# Patient Record
Sex: Male | Born: 1950
Health system: Southern US, Community
[De-identification: ages and names within clinical notes are randomized; demographics above are authoritative.]

## PROBLEM LIST (undated history)

## (undated) DIAGNOSIS — E119 Type 2 diabetes mellitus without complications: Secondary | ICD-10-CM

## (undated) DIAGNOSIS — E538 Deficiency of other specified B group vitamins: Secondary | ICD-10-CM

## (undated) DIAGNOSIS — M545 Other chronic pain: Secondary | ICD-10-CM

## (undated) DIAGNOSIS — E785 Hyperlipidemia, unspecified: Secondary | ICD-10-CM

## (undated) DIAGNOSIS — G709 Myoneural disorder, unspecified: Secondary | ICD-10-CM

## (undated) DIAGNOSIS — J189 Pneumonia, unspecified organism: Secondary | ICD-10-CM

## (undated) DIAGNOSIS — K219 Gastro-esophageal reflux disease without esophagitis: Secondary | ICD-10-CM

## (undated) DIAGNOSIS — R011 Cardiac murmur, unspecified: Secondary | ICD-10-CM

## (undated) DIAGNOSIS — M19071 Primary osteoarthritis, right ankle and foot: Secondary | ICD-10-CM

## (undated) DIAGNOSIS — G8929 Other chronic pain: Secondary | ICD-10-CM

## (undated) DIAGNOSIS — I10 Essential (primary) hypertension: Secondary | ICD-10-CM

## (undated) DIAGNOSIS — I2510 Atherosclerotic heart disease of native coronary artery without angina pectoris: Secondary | ICD-10-CM

## (undated) DIAGNOSIS — F419 Anxiety disorder, unspecified: Secondary | ICD-10-CM

## (undated) DIAGNOSIS — F32A Depression, unspecified: Secondary | ICD-10-CM

## (undated) DIAGNOSIS — G459 Transient cerebral ischemic attack, unspecified: Secondary | ICD-10-CM

## (undated) DIAGNOSIS — M1990 Unspecified osteoarthritis, unspecified site: Secondary | ICD-10-CM

## (undated) DIAGNOSIS — R7301 Impaired fasting glucose: Secondary | ICD-10-CM

## (undated) DIAGNOSIS — I639 Cerebral infarction, unspecified: Secondary | ICD-10-CM

## (undated) DIAGNOSIS — I700 Atherosclerosis of aorta: Secondary | ICD-10-CM

## (undated) DIAGNOSIS — R55 Syncope and collapse: Secondary | ICD-10-CM

## (undated) DIAGNOSIS — Z87442 Personal history of urinary calculi: Secondary | ICD-10-CM

## (undated) DIAGNOSIS — M722 Plantar fascial fibromatosis: Secondary | ICD-10-CM

## (undated) HISTORY — DX: Anxiety disorder, unspecified: F41.9

## (undated) HISTORY — DX: Essential (primary) hypertension: I10

## (undated) HISTORY — DX: Other chronic pain: M54.50

## (undated) HISTORY — DX: Myoneural disorder, unspecified: G70.9

## (undated) HISTORY — DX: Other chronic pain: G89.29

## (undated) HISTORY — DX: Gastro-esophageal reflux disease without esophagitis: K21.9

## (undated) HISTORY — DX: Type 2 diabetes mellitus without complications: E11.9

## (undated) HISTORY — DX: Plantar fascial fibromatosis: M72.2

## (undated) HISTORY — PX: CARDIAC CATHETERIZATION: SHX172

## (undated) HISTORY — DX: Unspecified osteoarthritis, unspecified site: M19.90

## (undated) HISTORY — DX: Atherosclerotic heart disease of native coronary artery without angina pectoris: I25.10

## (undated) HISTORY — DX: Low back pain: M54.5

## (undated) HISTORY — DX: Impaired fasting glucose: R73.01

## (undated) HISTORY — DX: Syncope and collapse: R55

## (undated) HISTORY — DX: Hyperlipidemia, unspecified: E78.5

---

## 2007-09-09 ENCOUNTER — Inpatient Hospital Stay: Payer: Self-pay | Admitting: Internal Medicine

## 2007-09-09 ENCOUNTER — Other Ambulatory Visit: Payer: Self-pay

## 2007-09-09 DIAGNOSIS — I2510 Atherosclerotic heart disease of native coronary artery without angina pectoris: Secondary | ICD-10-CM

## 2007-09-09 HISTORY — DX: Atherosclerotic heart disease of native coronary artery without angina pectoris: I25.10

## 2009-02-22 ENCOUNTER — Emergency Department: Payer: Self-pay | Admitting: Emergency Medicine

## 2009-07-01 HISTORY — PX: COLONOSCOPY: SHX174

## 2009-08-26 ENCOUNTER — Emergency Department: Payer: Self-pay | Admitting: Internal Medicine

## 2010-04-02 ENCOUNTER — Ambulatory Visit: Payer: Self-pay | Admitting: Urology

## 2011-05-02 ENCOUNTER — Ambulatory Visit: Payer: Self-pay | Admitting: Gastroenterology

## 2011-07-16 ENCOUNTER — Ambulatory Visit: Payer: Self-pay | Admitting: Gastroenterology

## 2012-01-29 DIAGNOSIS — N200 Calculus of kidney: Secondary | ICD-10-CM | POA: Insufficient documentation

## 2012-01-29 DIAGNOSIS — F450 Somatization disorder: Secondary | ICD-10-CM | POA: Insufficient documentation

## 2012-01-29 DIAGNOSIS — G8929 Other chronic pain: Secondary | ICD-10-CM | POA: Insufficient documentation

## 2012-01-29 DIAGNOSIS — K635 Polyp of colon: Secondary | ICD-10-CM | POA: Insufficient documentation

## 2012-07-13 DIAGNOSIS — R319 Hematuria, unspecified: Secondary | ICD-10-CM | POA: Insufficient documentation

## 2012-07-18 ENCOUNTER — Inpatient Hospital Stay: Payer: Self-pay | Admitting: Internal Medicine

## 2012-07-18 LAB — COMPREHENSIVE METABOLIC PANEL
Alkaline Phosphatase: 96 U/L (ref 50–136)
BUN: 20 mg/dL — ABNORMAL HIGH (ref 7–18)
Bilirubin,Total: 0.3 mg/dL (ref 0.2–1.0)
Calcium, Total: 8.4 mg/dL — ABNORMAL LOW (ref 8.5–10.1)
Chloride: 99 mmol/L (ref 98–107)
Co2: 24 mmol/L (ref 21–32)
EGFR (African American): >60
Glucose: 110 mg/dL — ABNORMAL HIGH (ref 65–99)
Potassium: 3.6 mmol/L (ref 3.5–5.1)
SGOT(AST): 47 U/L — ABNORMAL HIGH (ref 15–37)
SGPT (ALT): 40 U/L (ref 12–78)

## 2012-07-18 LAB — CBC
MCH: 30.2 pg (ref 26.0–34.0)
MCHC: 34.1 g/dL (ref 32.0–36.0)
Platelet: 142 10*3/uL — ABNORMAL LOW (ref 150–440)
RBC: 4.65 10*6/uL (ref 4.40–5.90)

## 2012-07-18 LAB — RAPID INFLUENZA A&B ANTIGENS

## 2012-07-19 LAB — BASIC METABOLIC PANEL
Anion Gap: 7 (ref 7–16)
BUN: 17 mg/dL (ref 7–18)
Creatinine: 1.12 mg/dL (ref 0.60–1.30)
EGFR (African American): >60
EGFR (Non-African Amer.): >60
Glucose: 115 mg/dL — ABNORMAL HIGH (ref 65–99)
Potassium: 3.6 mmol/L (ref 3.5–5.1)

## 2012-07-19 LAB — HEPATIC FUNCTION PANEL A (ARMC)
Bilirubin,Total: 0.3 mg/dL (ref 0.2–1.0)
SGOT(AST): 46 U/L — ABNORMAL HIGH (ref 15–37)
SGPT (ALT): 30 U/L (ref 12–78)
Total Protein: 5.7 g/dL — ABNORMAL LOW (ref 6.4–8.2)

## 2012-07-19 LAB — CBC WITH DIFFERENTIAL/PLATELET
Basophil #: 0 10*3/uL (ref 0.0–0.1)
Basophil %: 0.9 %
Eosinophil %: 0 %
HCT: 38.1 % — ABNORMAL LOW (ref 40.0–52.0)
Lymphocyte #: 0.8 10*3/uL — ABNORMAL LOW (ref 1.0–3.6)
Lymphocyte %: 24.6 %
MCH: 30.1 pg (ref 26.0–34.0)
MCV: 89 fL (ref 80–100)
Neutrophil %: 68.3 %
Platelet: 118 10*3/uL — ABNORMAL LOW (ref 150–440)
RBC: 4.26 10*6/uL — ABNORMAL LOW (ref 4.40–5.90)
WBC: 3.1 10*3/uL — ABNORMAL LOW (ref 3.8–10.6)

## 2013-07-02 DIAGNOSIS — R825 Elevated urine levels of drugs, medicaments and biological substances: Secondary | ICD-10-CM | POA: Insufficient documentation

## 2013-12-16 ENCOUNTER — Ambulatory Visit: Payer: Self-pay | Admitting: Family Medicine

## 2014-07-05 ENCOUNTER — Ambulatory Visit: Payer: Self-pay | Admitting: Internal Medicine

## 2014-07-09 ENCOUNTER — Ambulatory Visit: Payer: Self-pay

## 2014-07-09 LAB — RAPID INFLUENZA A&B ANTIGENS

## 2014-09-08 ENCOUNTER — Ambulatory Visit: Payer: Self-pay | Admitting: Gastroenterology

## 2014-09-08 LAB — HM DIABETES EYE EXAM

## 2014-10-21 NOTE — Discharge Summary (Signed)
PATIENT NAME:  Nathan Fields, Nathan Fields MR#:  [id number] DATE OF BIRTH:  1950-12-26  DATE OF ADMISSION:  07/18/2012 DATE OF DISCHARGE:  07/21/2012  PRIMARY CARE PHYSICIAN: Calla Kicks, MD    PRESENTING COMPLAINT: Shortness of breath and fever.   DISCHARGE DIAGNOSES: 1. Systemic inflammatory response syndrome secondary to suspected pneumonia/pneumonitis.  2. Possible viral syndrome.  3. Mild pancytopenia.  4. History of myocardial infarction in the past.   CONDITION ON DISCHARGE: Fair, Sats were 90 to 91% on room air.   MEDICATIONS: 1. Cyclobenzaprine 10 mg p.o. daily.  2. Atorvastatin 20 mg at bedtime.  3. Aspirin 81 mg daily.  4. Tylenol 650 q. 4 p.r.n.  5. Tamiflu 75 mg p.o. b.i.d.   6. Levaquin 750 p.o. daily.   DIET: Regular.   FOLLOWUP:  1. The patient will need repeat chest x-ray after completion of antibiotics.  2. Follow up with Dr. Eliberto Ivory on January 27th at 2:00 p.m.   DISCHARGE LABORATORY AND RADIOLOGICAL DATA:  White count is 5.9, hemoglobin and hematocrit is 13.6 and 39.6, platelet count is 166. Basic metabolic panel within normal limits. CK total was 305. Influenza A plus B negative. Blood cultures negative in 36 hours.   Chest x-ray consistent with nonspecific diffuse interstitial opacities, Ultrasound of the abdomen showed liver texture appears normal, gallbladder wall thickness is 2.3 mm. No pericholecystic fluid. No ascites, common bile duct is 1.8 mm. There is no cholelithiasis. Troponin was 0.02.   HOSPITAL COURSE: The patient is a 63 year old Caucasian gentleman with history of MI in the past,  came in with:   1. Systemic inflammatory response syndrome secondary to  diffuse pneumonitis versus pneumonia:  The patient was admitted, started on IV Levaquin high dose. He was unable to produce any sputum. Blood cultures were negative. The patient did not have any fever and continued to improve slowly. His sats were 90 to 91% on room air.  2. Suspected influenza or viral  syndrome: The patient was started on Tamiflu empirically, is to  complete a course of 5 days at home.  3. Dehydration as evidenced by hyponatremia: Resolved with IV fluids.  4. Pancytopenia: Secondary to viral illness, possible viral syndrome. Labs improved prior to discharge.  5. Elevated transaminases with pain in right upper quadrant: Ultrasound of abdomen was negative for gallbladder disease. The patient's symptoms improved. The patient was urging to go home to attend his sick wife, no other family around to help wife at home. Hence, he was requesting to go home. Under this circumstance, we will discharge the patient and recommended him to follow up with Dr. Eliberto Ivory as outpatient.   The hospital stay remained stable.   CODE STATUS: The patient remained a FULL CODE.   TIME SPENT: 40 minutes.   ____________________________ Hart Rochester Posey Pronto, MD sap:cb D: 07/23/2012 14:19:35 ET T: 07/23/2012 15:21:35 ET JOB#: [id number]  cc: Briasia Flinders A. Posey Pronto, MD, <Dictator> Dory Horn. Eliberto Ivory, MD Ilda Basset MD ELECTRONICALLY SIGNED 07/24/2012 21:33

## 2014-10-21 NOTE — H&P (Signed)
PATIENT NAME:  Nathan Fields, Nathan Fields MR#:  [id number] DATE OF BIRTH:  December 13, 1950  DATE OF ADMISSION:  07/18/2012  REFERRING PHYSICIAN:  Ferman Hamming, MD  PRIMARY CARE PHYSICIAN:  Calla Kicks, MD  CHIEF COMPLAINT: Shortness of breath, fever.   HISTORY OF PRESENT ILLNESS: The patient is a pleasant 64 year old Caucasian male with history of hyperlipidemia, MI and chronic back pain who has been feeling unwell since Monday. On that day the patient had annual visit with his PCP. There might have been from sick patients at the doctor's office.  That day he started to have fever, cough and feeling unwell. Apparently his wife also got a sick with flu-like symptoms. The patient's symptoms progressed while his wife symptoms improved.  He has been having body aches, cough with some pleuritic chest pain with coughing and shortness of breath as well as nausea, vomiting and p.o. intolerance. The fever became more of the higher grade and today he had a fever of 101.5. He went an urgent care center and there his oxygen sats been 89% and was referred here. He has been having overall weakness and poor p.o. intake as well. Hospitalist services were contacted for further evaluation and management.   PAST MEDICAL HISTORY:  History of myocardial infarction per chart, hyperlipidemia, chronic back pain   ALLERGIES: Denies.   SOCIAL HISTORY: No tobacco, alcohol or drug use.   PAST SURGICAL HISTORY: Denies.   FAMILY HISTORY: Mom with MI and heart failure.  Dad with diabetes.  MEDICATIONS AS AN OUTPATIENT:  He takes acetaminophen/hydrocodone tabs 500/7.5 mg 3 times a day as needed, Flexeril 10 mg once a day and Lipitor 20 mg at bedtime.   REVIEW OF SYSTEMS:    CONSTITUTIONAL: Positive for fever, fatigue and generalized weakness.  EYES: Some blurry vision.  ENT: No tinnitus or hearing loss.  RESPIRATORY: Positive for cough with some whitish sputum. No wheezing. No hemoptysis. Positive for shortness of breath and painful  respirations.  CARDIOVASCULAR: Pleuritic chest pain with deep respirations and cough. No orthopnea, edema or arrhythmia and no palpitations.  GASTROINTESTINAL: Positive for nausea and vomiting. Vomitus is whitish, somewhat better since Tuesday.  No abdominal pain or melena.  GENITOURINARY: Denies dysuria, hematuria, frequency.  HEMATOLOGIC/LYMPHATIC: No anemia or easy bruising.  SKIN: No rashes.  MUSCULOSKELETAL: Has some body aches and some chronic back pain.  NEUROLOGIC: No numbness, no focal weakness. No history of CVA or TIA.  PSYCHIATRIC: No anxiety or insomnia.   PHYSICAL EXAMINATION: VITAL SIGNS: Temperature on arrival 101.5, pulse rate 106, respiratory rate 22, blood pressure 143/82, O2 sat was 89% on room air.  GENERAL: The patient is an elderly Caucasian male lying in bed, ill-appearing, talking in full sentences, however.  HEENT: Normocephalic, atraumatic. Pupils are equal and reactive. Anicteric sclerae. Moist mucous membranes.  NECK: Supple. No thyroid tenderness. No cervical lymphadenopathy.  CARDIOVASCULAR: S1, S2, tachycardic. No murmurs, rubs or gallops. LUNGS:  Some rales at the left base. Good air entry.  ABDOMEN: Soft, nontender, nondistended. Positive bowel sounds in all quadrants. No organomegaly appreciated.  EXTREMITIES: No significant lower extremity edema.  SKIN: No obvious rashes.  NEUROLOGICAL: Cranial nerves II through XII grossly intact. Strength is 5 out of 5 in all quadrants. Sensation intact to light touch.  PSYCHIATRIC: Awake, alert, oriented x 3. Cooperative and conversant.   LABORATORY DATA:  Glucose 110, BUN 20, creatinine 1.04, sodium 133, potassium 3.6. LFTs: AST 47, otherwise within normal limits. WBC is 3.6, platelets 142, hemoglobin 14.1.  X-ray not  officially read but per my review as well as Dr. Verl Blalock, for probable left lower lobe pneumonia. EKG:  Normal sinus rhythm, rate is 81. T-wave inversions in 3, no acute ST elevations or depressions.    ASSESSMENT AND PLAN: We have a pleasant 64 year old Caucasian male with hyperlipidemia, history of myocardial infarction per chart who presents with flu-like symptoms with sepsis including fever, tachycardia and some mild leukopenia with white blood count of less than 4 with probable pneumonia on x-ray of the chest. He has been having flu symptoms as well as is not having a flu shot this year. He does have a wife who also came with similar symptoms around the same time and now better. This makes it more consistent with influenza with possible bacterial pneumonia superimposed infection. We will start the patient on Levaquin and Tamiflu as well and order rapid flu test and start the patient on Robitussin for his cough.  We will start him on gentle intravenous fluids for mild dehydration and hyponatremia. We will obtain sputum culture, blood culture. Would place him on droplet isolation at this point. His nausea, vomiting likely is secondary to above as well and we will start him on Zofran p.r.n. I would continue the statin for his hyperlipidemia. He does have mild leukopenia and thrombocytopenia which I would think also is from sepsis and pneumonia. We will monitor this. Start him on heparin for deep vein thrombosis prophylaxis as well as thromboembolic disease stockings and sequential compression devices. His pleuritic chest pain is likely in the setting of cough and pneumonia. His troponin is negative. There are no acute ST elevations or depressions on the EKG.  Would follow his clinical course. He has mild hypoxemia with oxygen sats of 89%. We will monitor his oxygen and place him on oxygen as needed.   CODE STATUS:  Patient is full code.  Total time spent: 50 minutes.   ____________________________ Vivien Presto, MD sa:ct D: 07/18/2012 19:05:18 ET T: 07/19/2012 07:03:29 ET JOB#: [id number]  cc: Vivien Presto, MD, <Dictator> Dory Horn. Eliberto Ivory, MD Karel Jarvis [facility] MD ELECTRONICALLY SIGNED 08/06/2012  51:76

## 2014-12-08 ENCOUNTER — Encounter: Payer: Self-pay | Admitting: Family Medicine

## 2014-12-08 ENCOUNTER — Ambulatory Visit (INDEPENDENT_AMBULATORY_CARE_PROVIDER_SITE_OTHER): Payer: Medicare Other | Admitting: Family Medicine

## 2014-12-08 VITALS — BP 148/84 | HR 75 | Temp 97.4°F | Ht 65.9 in | Wt 169.4 lb

## 2014-12-08 DIAGNOSIS — G8929 Other chronic pain: Secondary | ICD-10-CM | POA: Diagnosis not present

## 2014-12-08 DIAGNOSIS — E119 Type 2 diabetes mellitus without complications: Secondary | ICD-10-CM | POA: Insufficient documentation

## 2014-12-08 DIAGNOSIS — E785 Hyperlipidemia, unspecified: Secondary | ICD-10-CM | POA: Insufficient documentation

## 2014-12-08 DIAGNOSIS — I2510 Atherosclerotic heart disease of native coronary artery without angina pectoris: Secondary | ICD-10-CM | POA: Insufficient documentation

## 2014-12-08 DIAGNOSIS — I10 Essential (primary) hypertension: Secondary | ICD-10-CM | POA: Insufficient documentation

## 2014-12-08 DIAGNOSIS — M1990 Unspecified osteoarthritis, unspecified site: Secondary | ICD-10-CM | POA: Insufficient documentation

## 2014-12-08 DIAGNOSIS — M545 Low back pain, unspecified: Secondary | ICD-10-CM | POA: Insufficient documentation

## 2014-12-08 DIAGNOSIS — R7301 Impaired fasting glucose: Secondary | ICD-10-CM | POA: Insufficient documentation

## 2014-12-08 MED ORDER — MELOXICAM 15 MG PO TABS: 15.0000 mg | ORAL_TABLET | Freq: Every day | ORAL | Status: DC

## 2014-12-08 MED ORDER — OXYCODONE-ACETAMINOPHEN 10-325 MG PO TABS: 1.0000 | ORAL_TABLET | Freq: Three times a day (TID) | ORAL | Status: DC | PRN

## 2014-12-08 MED ORDER — CARISOPRODOL 250 MG PO TABS: 250.0000 mg | ORAL_TABLET | Freq: Four times a day (QID) | ORAL | Status: DC | PRN

## 2014-12-08 NOTE — Progress Notes (Signed)
BP 148/84 mmHg  Pulse 75  Temp(Src) 97.4 F (36.3 C)  Ht 5' 5.9" (1.674 m)  Wt 169 lb 6.4 oz (76.839 kg)  BMI 27.42 kg/m2  SpO2 99%   Subjective:    Patient ID: Nathan Fields, male    DOB: May 11, 1951, 64 y.o.   MRN: [id number]  HPI: Nathan Fields is a 64 y.o. male presenting on 12/08/2014 for Back Pain  BACK PAIN- Nathan Fields has a history of chronic back pain x20+ years. He doesn't like to take medication so he usually just deals with it and does martial arts to help with his pain. He has soma at home that he uses, but he uses it very rarely because he doesn't like how it makes him feel. He has gone to the pain clinic previously, but it didn't help and he's not interested in going now. He isn't sure what he did but he threw his back out and is feeling awful at this time.  Duration: 4 days Mechanism of injury: no trauma Location: Right and low back Onset: sudden Severity: severe Quality: sharp, aching and shooting Frequency: constant Radiation: R leg below the knee Aggravating factors: laying Alleviating factors: nothing, ice and muscle relaxer Status: worse Treatments attempted: rest, ice, heat, APAP, ibuprofen and aleve  Relief with NSAIDs?: mild Nighttime pain:  yes Paresthesias / decreased sensation:  yes Bowel / bladder incontinence:  yes Fevers:  no Dysuria / urinary frequency:  no  Relevant past medical, surgical, family and social history reviewed and updated as indicated. Interim medical history since our last visit reviewed. Allergies and medications reviewed and updated.  Meds:  Pravastatin 40mg  Soma 250mg    Review of Systems  Constitutional: Negative.   Respiratory: Negative.   Cardiovascular: Negative.   Gastrointestinal: Negative.   Musculoskeletal: Positive for myalgias, back pain and gait problem. Negative for joint swelling, arthralgias, neck pain and neck stiffness.  Skin: Negative.   Psychiatric/Behavioral: Negative.     Per HPI unless specifically  indicated above     Objective:    BP 148/84 mmHg  Pulse 75  Temp(Src) 97.4 F (36.3 C)  Ht 5' 5.9" (1.674 m)  Wt 169 lb 6.4 oz (76.839 kg)  BMI 27.42 kg/m2  SpO2 99%  Wt Readings from Last 3 Encounters:  12/08/14 169 lb 6.4 oz (76.839 kg)    Physical Exam  Constitutional: He appears well-developed and well-nourished. He appears distressed.  HENT:  Head: Normocephalic and atraumatic.  Cardiovascular: Normal rate, regular rhythm, normal heart sounds and intact distal pulses.   Pulmonary/Chest: Effort normal and breath sounds normal.  Abdominal: Soft. Bowel sounds are normal.  Skin: Skin is warm and dry.  Psychiatric: He has a normal mood and affect. His behavior is normal. Judgment and thought content normal.   Back Exam:    Inspection: Abnormal Inspection   Curvature: Flattened lumbar lordosis   Deformity: no  Ecchymosis: nonone  Erythema:  nonone  Lesions: no    Palpation:     Midline spinal tenderness: no none      Paralumbar tenderness: yesR>L     Parathoracic tenderness: no     Buttocks tenderness: yesRight     Range of Motion:      Flexion: Fingers to Knees     Extension:Decreased     Lateral bending:Decreased    Rotation:Decreased    Neuro Exam:Lower extremity DTRs normal & symmetric.  Strength and sensation intact.    Special Tests:      Straight leg raise:positive-  likely due to severely hypertonic hamstrings R>L      Assessment & Plan:   Problem List Items Addressed This Visit    Acute exacerbation of chronic low back pain - Primary    Nathan Fields is not doing well with his back. In a lot of spasm and pain. Will send to PT to work on his hamstrings. Will increase his soma that he uses rarely to 4x a day as needed for the next 2 weeks, he will call if he needs a refill on it. Will have him stop ibuprofen and start meloxicam, which we will monitor closely given his BP. Short term narcotics given to patient to help with pain. Exercises for back also given. Will  follow up in 2 weeks for recheck on back, but he will call if not getting better or getting worse.       Relevant Medications   meloxicam (MOBIC) 15 MG tablet   oxyCODONE-acetaminophen (PERCOCET) 10-325 MG per tablet   carisoprodol (SOMA) 250 MG tablet   Other Relevant Orders   Ambulatory referral to Physical Therapy   Hypertension    Much better on recheck. Likely elevated from pain. Will check again in 2 weeks.       Relevant Medications   pravastatin (PRAVACHOL) 20 MG tablet   Diabetes mellitus without complication    Due for recheck on his labs will check next visit.       Relevant Medications   pravastatin (PRAVACHOL) 20 MG tablet   Other Relevant Orders   Bayer DCA Hb A1c Waived       Follow up plan: Return in about 2 weeks (around 12/22/2014) for DM follow up and FU back pain.

## 2014-12-08 NOTE — Assessment & Plan Note (Signed)
Due for recheck on his labs will check next visit.

## 2014-12-08 NOTE — Assessment & Plan Note (Signed)
Much better on recheck. Likely elevated from pain. Will check again in 2 weeks.

## 2014-12-08 NOTE — Assessment & Plan Note (Signed)
Nathan Fields is not doing well with his back. In a lot of spasm and pain. Will send to PT to work on his hamstrings. Will increase his soma that he uses rarely to 4x a day as needed for the next 2 weeks, he will call if he needs a refill on it. Will have him stop ibuprofen and start meloxicam, which we will monitor closely given his BP. Short term narcotics given to patient to help with pain. Exercises for back also given. Will follow up in 2 weeks for recheck on back, but he will call if not getting better or getting worse.

## 2014-12-08 NOTE — Patient Instructions (Addendum)
-Take your soma up to 4x a day as needed for the next 2 weeks.  -Take your percocet 3x a day as needed for the next 2 weeks. -Take the mobic daily for next 2 weeks- stop any ibuprofen, advil, motrin, aleve, naproxen while on this medicine.  -Go to Physical Therapy to get your legs stretched out to help your back. -Do the exercises below to [place] to help stretch yourself out- STOP IF IT HURTS. - Come back in 2 weeks to recheck your back and for follow up on your diabetes and blood pressure.   Back Exercises These exercises may help you when beginning to rehabilitate your injury. Your symptoms may resolve with or without further involvement from your physician, physical therapist or athletic trainer. While completing these exercises, remember:   Restoring tissue flexibility helps normal motion to return to the joints. This allows healthier, less painful movement and activity.  An effective stretch should be held for at least 30 seconds.  A stretch should never be painful. You should only feel a gentle lengthening or release in the stretched tissue. STRETCH - Extension, Prone on Elbows   Lie on your stomach on the floor, a bed will be too soft. Place your palms about shoulder width apart and at the height of your head.  Place your elbows under your shoulders. If this is too painful, stack pillows under your chest.  Allow your body to relax so that your hips drop lower and make contact more completely with the floor.  Hold this position for __________ seconds.  Slowly return to lying flat on the floor. Repeat __________ times. Complete this exercise __________ times per day.  RANGE OF MOTION - Extension, Prone Press Ups   Lie on your stomach on the floor, a bed will be too soft. Place your palms about shoulder width apart and at the height of your head.  Keeping your back as relaxed as possible, slowly straighten your elbows while keeping your hips on the floor. You  may adjust the placement of your hands to maximize your comfort. As you gain motion, your hands will come more underneath your shoulders.  Hold this position __________ seconds.  Slowly return to lying flat on the floor. Repeat __________ times. Complete this exercise __________ times per day.  RANGE OF MOTION- Quadruped, Neutral Spine   Assume a hands and knees position on a firm surface. Keep your hands under your shoulders and your knees under your hips. You may place padding under your knees for comfort.  Drop your head and point your tail bone toward the ground below you. This will round out your low back like an angry cat. Hold this position for __________ seconds.  Slowly lift your head and release your tail bone so that your back sags into a large arch, like an old horse.  Hold this position for __________ seconds.  Repeat this until you feel limber in your low back.  Now, find your "sweet spot." This will be the most comfortable position somewhere between the two previous positions. This is your neutral spine. Once you have found this position, tense your stomach muscles to support your low back.  Hold this position for __________ seconds. Repeat __________ times. Complete this exercise __________ times per day.  STRETCH - Flexion, Single Knee to Chest   Lie on a firm bed or floor with both legs extended in front of you.  Keeping one leg in contact with the floor, bring  your opposite knee to your chest. Hold your leg in place by either grabbing behind your thigh or at your knee.  Pull until you feel a gentle stretch in your low back. Hold __________ seconds.  Slowly release your grasp and repeat the exercise with the opposite side. Repeat __________ times. Complete this exercise __________ times per day.  STRETCH - Hamstrings, Standing  Stand or sit and extend your right / left leg, placing your foot on a chair or foot stool  Keeping a slight arch in your low back and  your hips straight forward.  Lead with your chest and lean forward at the waist until you feel a gentle stretch in the back of your right / left knee or thigh. (When done correctly, this exercise requires leaning only a small distance.)  Hold this position for __________ seconds. Repeat __________ times. Complete this stretch __________ times per day. STRENGTHENING - Deep Abdominals, Pelvic Tilt   Lie on a firm bed or floor. Keeping your legs in front of you, bend your knees so they are both pointed toward the ceiling and your feet are flat on the floor.  Tense your lower abdominal muscles to press your low back into the floor. This motion will rotate your pelvis so that your tail bone is scooping upwards rather than pointing at your feet or into the floor.  With a gentle tension and even breathing, hold this position for __________ seconds. Repeat __________ times. Complete this exercise __________ times per day.  STRENGTHENING - Abdominals, Crunches   Lie on a firm bed or floor. Keeping your legs in front of you, bend your knees so they are both pointed toward the ceiling and your feet are flat on the floor. Cross your arms over your chest.  Slightly tip your chin down without bending your neck.  Tense your abdominals and slowly lift your trunk high enough to just clear your shoulder blades. Lifting higher can put excessive stress on the low back and does not further strengthen your abdominal muscles.  Control your return to the starting position. Repeat __________ times. Complete this exercise __________ times per day.  STRENGTHENING - Quadruped, Opposite UE/LE Lift   Assume a hands and knees position on a firm surface. Keep your hands under your shoulders and your knees under your hips. You may place padding under your knees for comfort.  Find your neutral spine and gently tense your abdominal muscles so that you can maintain this position. Your shoulders and hips should form a  rectangle that is parallel with the floor and is not twisted.  Keeping your trunk steady, lift your right hand no higher than your shoulder and then your left leg no higher than your hip. Make sure you are not holding your breath. Hold this position __________ seconds.  Continuing to keep your abdominal muscles tense and your back steady, slowly return to your starting position. Repeat with the opposite arm and leg. Repeat __________ times. Complete this exercise __________ times per day. Document Released: 07/05/2005 Document Revised: 09/09/2011 Document Reviewed: 09/29/2008 [facility] Patient Information 2015 [place], [place]. This information is not intended to replace advice given to you by your health care provider. Make sure you discuss any questions you have with your health care provider.

## 2014-12-21 ENCOUNTER — Encounter: Payer: Self-pay | Admitting: Family Medicine

## 2014-12-21 ENCOUNTER — Ambulatory Visit (INDEPENDENT_AMBULATORY_CARE_PROVIDER_SITE_OTHER): Payer: Medicare Other | Admitting: Family Medicine

## 2014-12-21 VITALS — BP 135/83 | HR 69 | Temp 97.7°F | Ht 66.0 in | Wt 169.6 lb

## 2014-12-21 DIAGNOSIS — M545 Low back pain, unspecified: Secondary | ICD-10-CM

## 2014-12-21 DIAGNOSIS — G8929 Other chronic pain: Secondary | ICD-10-CM

## 2014-12-21 DIAGNOSIS — E119 Type 2 diabetes mellitus without complications: Secondary | ICD-10-CM

## 2014-12-21 DIAGNOSIS — Z550 Illiteracy and low-level literacy: Secondary | ICD-10-CM | POA: Insufficient documentation

## 2014-12-21 LAB — BAYER DCA HB A1C WAIVED: HB A1C (BAYER DCA - WAIVED): 6.4 % (ref <7.0)

## 2014-12-21 MED ORDER — CARISOPRODOL 250 MG PO TABS: 250.0000 mg | ORAL_TABLET | Freq: Four times a day (QID) | ORAL | Status: DC | PRN

## 2014-12-21 MED ORDER — OXYCODONE-ACETAMINOPHEN 10-325 MG PO TABS: 1.0000 | ORAL_TABLET | Freq: Three times a day (TID) | ORAL | Status: DC | PRN

## 2014-12-21 NOTE — Progress Notes (Signed)
BP 135/83 mmHg  Pulse 69  Temp(Src) 97.7 F (36.5 C)  Ht 5\' 6"  (1.676 m)  Wt 169 lb 9.6 oz (76.93 kg)  BMI 27.39 kg/m2  SpO2 96%   Subjective:    Patient ID: Nathan Fields, male    DOB: August 31, 1950, 64 y.o.   MRN: [id number]  HPI: Nathan Fields is a 64 y.o. male  Chief Complaint  Patient presents with  . Back Pain    Patient needs a refill on his pain medication and Soma.   . Diabetes   DIABETES Hypoglycemic episodes:no Polydipsia/polyuria: no Visual disturbance: no Chest pain: no Paresthesias: yes Glucose Monitoring: no Blood Pressure Monitoring: not checking Retinal Examination: Up to Date Foot Exam: Up to Date Diabetic Education: Not Completed Pneumovax: refused Influenza: Refused Aspirin: no  BACK PAIN- here today for follow up on his back. PT has been helping when he is there, but by the time he gets home it's gone. He notes that his legs are feeling mediocre. Not necessarily feeling better. Sleeping on the floor makes it feel better. He is concerned as it hasn't been this bad in a long time. Would like refills on his medicine.  Duration: 3 weeks Mechanism of injury: no trauma Location: R>L and low back Onset: sudden Severity: severe Quality: sharp, aching, shooting, stabbing and throbbing Frequency: constant Radiation: R leg below the knee Aggravating factors: leaning back Alleviating factors: ice, heat, narcotics and muscle relaxer Status: stable Treatments attempted: rest, ice, heat, APAP, ibuprofen, aleve and physical therapy  Relief with NSAIDs?: no Nighttime pain:  no Paresthesias / decreased sensation:  yes Bowel / bladder incontinence:  no Fevers:  no Dysuria / urinary frequency:  no   Relevant past medical, surgical, family and social history reviewed and updated as indicated. Interim medical history since our last visit reviewed. Allergies and medications reviewed and updated.  Review of Systems  Constitutional: Negative.    Respiratory: Negative.   Cardiovascular: Negative.   Gastrointestinal: Negative.   Musculoskeletal: Positive for back pain and gait problem. Negative for myalgias, joint swelling, arthralgias, neck pain and neck stiffness.  Psychiatric/Behavioral: Negative.    Per HPI unless specifically indicated above     Objective:    BP 135/83 mmHg  Pulse 69  Temp(Src) 97.7 F (36.5 C)  Ht 5\' 6"  (1.676 m)  Wt 169 lb 9.6 oz (76.93 kg)  BMI 27.39 kg/m2  SpO2 96%  Wt Readings from Last 3 Encounters:  12/21/14 169 lb 9.6 oz (76.93 kg)  09/19/14 164 lb (74.39 kg)  12/08/14 169 lb 6.4 oz (76.839 kg)    Physical Exam  Constitutional: He is oriented to person, place, and time. He appears well-developed and well-nourished. No distress.  Uncomfortable appearing   HENT:  Head: Normocephalic and atraumatic.  Right Ear: Hearing normal.  Left Ear: Hearing normal.  Nose: Nose normal.  Eyes: Conjunctivae and lids are normal. Right eye exhibits no discharge. Left eye exhibits no discharge. No scleral icterus.  Cardiovascular: Normal rate and regular rhythm.  Exam reveals no gallop and no friction rub.   No murmur heard. Pulmonary/Chest: Effort normal and breath sounds normal. No respiratory distress. He has no wheezes. He has no rales. He exhibits no tenderness.  Neurological: He is alert and oriented to person, place, and time.  Skin: Skin is warm, dry and intact. No rash noted. He is not diaphoretic. No erythema.  Psychiatric: He has a normal mood and affect. His speech is normal and behavior is normal.  Judgment and thought content normal. Cognition and memory are normal.  Nursing note and vitals reviewed. Back Exam:    Inspection: Abnormal Inspection   Curvature: Flattened lumbar lordosis   Deformity: no  Ecchymosis: no  Erythema:  no  Lesions: no    Palpation:     Midline spinal tenderness: no       Paralumbar tenderness: yesR>L     Parathoracic tenderness: no     Buttocks tenderness:  yesR>L     Range of Motion:      Flexion: Fingers to Knees     Extension:Decreased     Lateral bending:Decreased    Rotation:Decreased    Neuro Exam:Abnormal     Patellar DTRs: Normal and 2/4     Ankle dorsiflexion strength:5/5   Knee flexion: 4/5 on the R     Sensation(medial malleolus): decreased on the R     Great toe dorsiflexion strength: 5/5     Sensation (mid dorsal foot):Within Normal Limits     Ankle DTRs: Normal, Symmetric and 2/4     Ankle plantar flexion strength:5/5     Sensation (lateral heel):Within Normal Limits    Special Tests:      Straight leg raise:negative         Assessment & Plan:   Problem List Items Addressed This Visit      Endocrine   Diabetes mellitus without complication    W2B came back at 6.4 today! Doing well with diet and exercise. OK to recheck A1c in 6 months. Continue to monitor.       Relevant Medications   aspirin 81 MG tablet   Other Relevant Orders   Bayer DCA Hb A1c Waived     Other   Acute exacerbation of chronic low back pain - Primary    Not doing better with his medicine and his PT. Abnormal neuro exam. Concern for radiculopathy. Will obtain x-ray today and move onto MRI if needed. Refills given. Continue PT. Continue to monitor. Await results. Follow up 2-3 weeks.       Relevant Medications   aspirin 81 MG tablet   oxyCODONE-acetaminophen (PERCOCET) 10-325 MG per tablet   carisoprodol (SOMA) 250 MG tablet   Other Relevant Orders   DG Lumbar Spine Complete W/Bend       Follow up plan: Return 2-3 weeks, for follow up back.

## 2014-12-21 NOTE — Assessment & Plan Note (Signed)
A1c came back at 6.4 today! Doing well with diet and exercise. OK to recheck A1c in 6 months. Continue to monitor.

## 2014-12-21 NOTE — Assessment & Plan Note (Signed)
Not doing better with his medicine and his PT. Abnormal neuro exam. Concern for radiculopathy. Will obtain x-ray today and move onto MRI if needed. Refills given. Continue PT. Continue to monitor. Await results. Follow up 2-3 weeks.

## 2014-12-22 ENCOUNTER — Ambulatory Visit: Payer: Medicare Other | Admitting: Family Medicine

## 2014-12-23 ENCOUNTER — Ambulatory Visit
Admission: RE | Admit: 2014-12-23 | Discharge: 2014-12-23 | Disposition: A | Discharge status: Home / Self Care | Payer: Medicare Other | Source: Ambulatory Visit | Attending: Family Medicine | Admitting: Family Medicine

## 2014-12-23 DIAGNOSIS — G8929 Other chronic pain: Secondary | ICD-10-CM

## 2014-12-23 DIAGNOSIS — M545 Low back pain: Secondary | ICD-10-CM | POA: Insufficient documentation

## 2014-12-26 ENCOUNTER — Telehealth: Payer: Self-pay | Admitting: Family Medicine

## 2014-12-26 DIAGNOSIS — M545 Low back pain, unspecified: Secondary | ICD-10-CM

## 2014-12-26 DIAGNOSIS — G8929 Other chronic pain: Secondary | ICD-10-CM

## 2014-12-26 NOTE — Telephone Encounter (Signed)
Called and spoke to Nathan Fields- will call back to give the results later.

## 2014-12-27 NOTE — Telephone Encounter (Signed)
Called and spoke to [facility]. They would like to do the MRI. Ordered today.

## 2015-01-04 ENCOUNTER — Ambulatory Visit: Payer: Medicare Other

## 2015-01-05 ENCOUNTER — Ambulatory Visit (INDEPENDENT_AMBULATORY_CARE_PROVIDER_SITE_OTHER): Payer: Medicare Other | Admitting: Family Medicine

## 2015-01-05 ENCOUNTER — Encounter: Payer: Self-pay | Admitting: Family Medicine

## 2015-01-05 VITALS — BP 164/95 | HR 69 | Temp 97.9°F | Wt 169.5 lb

## 2015-01-05 DIAGNOSIS — I10 Essential (primary) hypertension: Secondary | ICD-10-CM | POA: Diagnosis not present

## 2015-01-05 DIAGNOSIS — G8929 Other chronic pain: Secondary | ICD-10-CM | POA: Diagnosis not present

## 2015-01-05 DIAGNOSIS — M545 Low back pain: Secondary | ICD-10-CM

## 2015-01-05 MED ORDER — LISINOPRIL 5 MG PO TABS: 5.0000 mg | ORAL_TABLET | Freq: Every day | ORAL | Status: DC

## 2015-01-05 MED ORDER — HYDROCODONE-ACETAMINOPHEN 10-325 MG PO TABS: 1.0000 | ORAL_TABLET | Freq: Three times a day (TID) | ORAL | Status: DC | PRN

## 2015-01-05 MED ORDER — CARISOPRODOL 350 MG PO TABS: 350.0000 mg | ORAL_TABLET | Freq: Four times a day (QID) | ORAL | Status: DC | PRN

## 2015-01-05 NOTE — Progress Notes (Signed)
BP 164/95 mmHg  Pulse 69  Temp(Src) 97.9 F (36.6 C)  Wt 169 lb 8 oz (76.885 kg)  SpO2 98%   Subjective:    Patient ID: Nathan Fields, male    DOB: Apr 22, 1951, 64 y.o.   MRN: [id number]  HPI: Nathan Fields is a 64 y.o. male  Chief Complaint  Patient presents with  . Back Pain   HYPERTENSION Hypertension status: uncontrolled  Satisfied with current treatment? yes Duration of hypertension: chronic BP monitoring frequency:  not checking Previous BP meds:atenolol Aspirin: no Recurrent headaches: no Visual changes: no Palpitations: no Dyspnea: no Chest pain: no Lower extremity edema: no Dizzy/lightheaded: no  BACK PAIN- doing a bit better. Felt better for about 4 days Percocet too strong. Makes him feel sick Duration: chronic- a lot worse over the past couple of weeks Mechanism of injury: lifting Location: bilateral and low back Onset: sudden Severity: severe Quality: sharp and aching Frequency: constant Radiation: R leg below the knee Aggravating factors: lifting, movement, walking, laying, bending and prolonged sitting Alleviating factors: rest, laying, narcotics and muscle relaxer Status: better Treatments attempted: rest, ice, heat, APAP, ibuprofen, aleve, physical therapy and HEP  Relief with NSAIDs?: no Nighttime pain:  no Paresthesias / decreased sensation:  yes Bowel / bladder incontinence:  no Fevers:  no Dysuria / urinary frequency:  no  Relevant past medical, surgical, family and social history reviewed and updated as indicated. Interim medical history since our last visit reviewed. Allergies and medications reviewed and updated.  Review of Systems  Constitutional: Negative.   Respiratory: Negative.   Cardiovascular: Negative.   Musculoskeletal: Positive for myalgias, back pain and gait problem. Negative for joint swelling, arthralgias, neck pain and neck stiffness.  Psychiatric/Behavioral: Negative.     Per HPI unless specifically indicated  above     Objective:    BP 164/95 mmHg  Pulse 69  Temp(Src) 97.9 F (36.6 C)  Wt 169 lb 8 oz (76.885 kg)  SpO2 98%  Wt Readings from Last 3 Encounters:  01/05/15 169 lb 8 oz (76.885 kg)  12/21/14 169 lb 9.6 oz (76.93 kg)  09/19/14 164 lb (74.39 kg)    Physical Exam  Constitutional: He is oriented to person, place, and time. He appears well-developed and well-nourished. No distress.  HENT:  Head: Normocephalic and atraumatic.  Right Ear: Hearing normal.  Left Ear: Hearing normal.  Nose: Nose normal.  Eyes: Conjunctivae and lids are normal. Right eye exhibits no discharge. Left eye exhibits no discharge. No scleral icterus.  Cardiovascular: Normal rate, regular rhythm and normal heart sounds.  Exam reveals no gallop and no friction rub.   No murmur heard. Pulmonary/Chest: Effort normal. No respiratory distress. He has no wheezes. He has no rales. He exhibits no tenderness.  Neurological: He is alert and oriented to person, place, and time.  Skin: Skin is intact. No rash noted.  Psychiatric: He has a normal mood and affect. His speech is normal and behavior is normal. Judgment and thought content normal. Cognition and memory are normal.  Nursing note and vitals reviewed. Back Exam:    Inspection:  Normal spinal curvature.  No deformity, ecchymosis, erythema, or lesions hypertonic paraspinal muscles in the lumbar bilaterally    Palpation:     Midline spinal tenderness: no      Paralumbar tenderness: yesbilateral     Parathoracic tenderness: no     Buttocks tenderness: no     Range of Motion:      Flexion: Fingers to  Knees     Extension:Decreased     Lateral bending:Decreased    Rotation:Decreased    Neuro Exam:Abnormal   Patellar DTRs: Normal and 2/4  Ankle dorsiflexion strength:5/5  Knee flexion: 4/5 on the R  Sensation(medial malleolus): decreased on the R  Great toe dorsiflexion strength: 5/5  Sensation (mid  dorsal foot):Within Normal Limits   Ankle DTRs: Normal, Symmetric and 2/4  Ankle plantar flexion strength:5/5  Sensation (lateral heel):Within Normal Limits      Special Tests:      Straight leg raise:positive          Assessment & Plan:   Problem List Items Addressed This Visit      Cardiovascular and Mediastinum   Hypertension    Still not doing well, but very labile. Will start 5mg  of lisinopril and monitor. Call if getting dizzy and cut in half. Will check BMP and microalbumin next visit. Follow up in 1 month.       Relevant Medications   lisinopril (PRINIVIL,ZESTRIL) 5 MG tablet     Other   Acute exacerbation of chronic low back pain - Primary    Doing a bit better. Had 4 days without pain. Couldn't afford MRI- will get it done when money is a little less tight. Percocet making him sick, will switch to vicodin. Soma 250 too expensive- cheaper at 350- will change for cost. Continue exercises. Continue to monitor. PT as recommended.      Relevant Medications   carisoprodol (SOMA) 350 MG tablet   HYDROcodone-acetaminophen (NORCO) 10-325 MG per tablet       Follow up plan: Return in about 4 weeks (around 02/02/2015).

## 2015-01-05 NOTE — Assessment & Plan Note (Signed)
Doing a bit better. Had 4 days without pain. Couldn't afford MRI- will get it done when money is a little less tight. Percocet making him sick, will switch to vicodin. Soma 250 too expensive- cheaper at 350- will change for cost. Continue exercises. Continue to monitor. PT as recommended.

## 2015-01-05 NOTE — Patient Instructions (Signed)
Back Exercises These exercises may help you when beginning to rehabilitate your injury. Your symptoms may resolve with or without further involvement from your physician, physical therapist or athletic trainer. While completing these exercises, remember:   Restoring tissue flexibility helps normal motion to return to the joints. This allows healthier, less painful movement and activity.  An effective stretch should be held for at least 30 seconds.  A stretch should never be painful. You should only feel a gentle lengthening or release in the stretched tissue. STRETCH - Extension, Prone on Elbows   Lie on your stomach on the floor, a bed will be too soft. Place your palms about shoulder width apart and at the height of your head.  Place your elbows under your shoulders. If this is too painful, stack pillows under your chest.  Allow your body to relax so that your hips drop lower and make contact more completely with the floor.  Hold this position for __________ seconds.  Slowly return to lying flat on the floor. Repeat __________ times. Complete this exercise __________ times per day.  RANGE OF MOTION - Extension, Prone Press Ups   Lie on your stomach on the floor, a bed will be too soft. Place your palms about shoulder width apart and at the height of your head.  Keeping your back as relaxed as possible, slowly straighten your elbows while keeping your hips on the floor. You may adjust the placement of your hands to maximize your comfort. As you gain motion, your hands will come more underneath your shoulders.  Hold this position __________ seconds.  Slowly return to lying flat on the floor. Repeat __________ times. Complete this exercise __________ times per day.  RANGE OF MOTION- Quadruped, Neutral Spine   Assume a hands and knees position on a firm surface. Keep your hands under your shoulders and your knees under your hips. You may place padding under your knees for  comfort.  Drop your head and point your tail bone toward the ground below you. This will round out your low back like an angry cat. Hold this position for __________ seconds.  Slowly lift your head and release your tail bone so that your back sags into a large arch, like an old horse.  Hold this position for __________ seconds.  Repeat this until you feel limber in your low back.  Now, find your "sweet spot." This will be the most comfortable position somewhere between the two previous positions. This is your neutral spine. Once you have found this position, tense your stomach muscles to support your low back.  Hold this position for __________ seconds. Repeat __________ times. Complete this exercise __________ times per day.  STRETCH - Flexion, Single Knee to Chest   Lie on a firm bed or floor with both legs extended in front of you.  Keeping one leg in contact with the floor, bring your opposite knee to your chest. Hold your leg in place by either grabbing behind your thigh or at your knee.  Pull until you feel a gentle stretch in your low back. Hold __________ seconds.  Slowly release your grasp and repeat the exercise with the opposite side. Repeat __________ times. Complete this exercise __________ times per day.  STRETCH - Hamstrings, Standing  Stand or sit and extend your right / left leg, placing your foot on a chair or foot stool  Keeping a slight arch in your low back and your hips straight forward.  Lead with your chest and   lean forward at the waist until you feel a gentle stretch in the back of your right / left knee or thigh. (When done correctly, this exercise requires leaning only a small distance.)  Hold this position for __________ seconds. Repeat __________ times. Complete this stretch __________ times per day. STRENGTHENING - Deep Abdominals, Pelvic Tilt   Lie on a firm bed or floor. Keeping your legs in front of you, bend your knees so they are both pointed  toward the ceiling and your feet are flat on the floor.  Tense your lower abdominal muscles to press your low back into the floor. This motion will rotate your pelvis so that your tail bone is scooping upwards rather than pointing at your feet or into the floor.  With a gentle tension and even breathing, hold this position for __________ seconds. Repeat __________ times. Complete this exercise __________ times per day.  STRENGTHENING - Abdominals, Crunches   Lie on a firm bed or floor. Keeping your legs in front of you, bend your knees so they are both pointed toward the ceiling and your feet are flat on the floor. Cross your arms over your chest.  Slightly tip your chin down without bending your neck.  Tense your abdominals and slowly lift your trunk high enough to just clear your shoulder blades. Lifting higher can put excessive stress on the low back and does not further strengthen your abdominal muscles.  Control your return to the starting position. Repeat __________ times. Complete this exercise __________ times per day.  STRENGTHENING - Quadruped, Opposite UE/LE Lift   Assume a hands and knees position on a firm surface. Keep your hands under your shoulders and your knees under your hips. You may place padding under your knees for comfort.  Find your neutral spine and gently tense your abdominal muscles so that you can maintain this position. Your shoulders and hips should form a rectangle that is parallel with the floor and is not twisted.  Keeping your trunk steady, lift your right hand no higher than your shoulder and then your left leg no higher than your hip. Make sure you are not holding your breath. Hold this position __________ seconds.  Continuing to keep your abdominal muscles tense and your back steady, slowly return to your starting position. Repeat with the opposite arm and leg. Repeat __________ times. Complete this exercise __________ times per day. Document Released:  07/05/2005 Document Revised: 09/09/2011 Document Reviewed: 09/29/2008 [employer] Patient Information 2015 [employer]. This information is not intended to replace advice given to you by your health care provider. Make sure you discuss any questions you have with your health care provider.  

## 2015-01-05 NOTE — Assessment & Plan Note (Signed)
Still not doing well, but very labile. Will start 5mg  of lisinopril and monitor. Call if getting dizzy and cut in half. Will check BMP and microalbumin next visit. Follow up in 1 month.

## 2015-02-06 ENCOUNTER — Encounter: Payer: Self-pay | Admitting: Family Medicine

## 2015-02-06 ENCOUNTER — Ambulatory Visit (INDEPENDENT_AMBULATORY_CARE_PROVIDER_SITE_OTHER): Payer: Medicare Other | Admitting: Family Medicine

## 2015-02-06 VITALS — BP 165/90 | HR 81 | Temp 97.3°F | Wt 170.1 lb

## 2015-02-06 DIAGNOSIS — G8929 Other chronic pain: Secondary | ICD-10-CM

## 2015-02-06 DIAGNOSIS — M545 Low back pain: Secondary | ICD-10-CM

## 2015-02-06 DIAGNOSIS — I10 Essential (primary) hypertension: Secondary | ICD-10-CM | POA: Diagnosis not present

## 2015-02-06 MED ORDER — HYDROCODONE-ACETAMINOPHEN 10-325 MG PO TABS: 1.0000 | ORAL_TABLET | Freq: Three times a day (TID) | ORAL | Status: DC | PRN

## 2015-02-06 MED ORDER — HYDROCHLOROTHIAZIDE 12.5 MG PO TABS: 12.5000 mg | ORAL_TABLET | Freq: Every day | ORAL | Status: DC

## 2015-02-06 NOTE — Assessment & Plan Note (Signed)
Didn't tolerate lisinopril 5mg . Will start low dose HCTZ. BP still out of control. Follow up 1 month.

## 2015-02-06 NOTE — Progress Notes (Signed)
BP 165/90 mmHg  Pulse 81  Temp(Src) 97.3 F (36.3 C)  Wt 170 lb 1.6 oz (77.157 kg)  SpO2 97%   Subjective:    Patient ID: Nathan Fields, male    DOB: December 18, 1950, 64 y.o.   MRN: [id number]  HPI: Nathan Fields is a 64 y.o. male  Chief Complaint  Patient presents with  . Back Pain  . Hypertension    Patient states that he stopped his bp medication due to side effects.   HYPERTENSION Hypertension status: uncontrolled  Satisfied with current treatment? yes Duration of hypertension: chronic BP monitoring frequency:  not checking BP medication side effects:  yes- felt irritable. Unsure what side effects he had. Felt sick on it. Stopped it about 2 weeks Medication compliance: poor compliance Previous BP meds: lisinopril 5mg  Aspirin: no Recurrent headaches: no Visual changes: no Palpitations: no Dyspnea: no Chest pain: no Lower extremity edema: no Dizzy/lightheaded: yes  BACK PAIN- doing better. Feels like his back pain is there, but not as bad as it has been  Duration: chronic Mechanism of injury: lifting Location: bilateral and low back Onset: sudden Severity: severe Quality: sharp and aching Frequency: constant Radiation: R leg below the knee Aggravating factors: lifting, movement, walking, laying, bending and prolonged sitting Alleviating factors: nothing, rest, heat, laying, narcotics and muscle relaxer Status: better Treatments attempted: rest, ice, heat, APAP, ibuprofen, aleve, physical therapy, HEP and OMM  Relief with NSAIDs?: moderate Nighttime pain:  no Paresthesias / decreased sensation:  no Bowel / bladder incontinence:  no Fevers:  no Dysuria / urinary frequency:  no  Relevant past medical, surgical, family and social history reviewed and updated as indicated. Interim medical history since our last visit reviewed. Allergies and medications reviewed and updated.  Review of Systems  Constitutional: Negative.   Respiratory: Negative.    Cardiovascular: Negative.   Gastrointestinal: Negative.   Musculoskeletal: Positive for myalgias, back pain and gait problem. Negative for joint swelling, arthralgias, neck pain and neck stiffness.  Psychiatric/Behavioral: Negative.     Per HPI unless specifically indicated above     Objective:    BP 165/90 mmHg  Pulse 81  Temp(Src) 97.3 F (36.3 C)  Wt 170 lb 1.6 oz (77.157 kg)  SpO2 97%  Wt Readings from Last 3 Encounters:  02/06/15 170 lb 1.6 oz (77.157 kg)  01/05/15 169 lb 8 oz (76.885 kg)  12/21/14 169 lb 9.6 oz (76.93 kg)    Physical Exam  Constitutional: He is oriented to person, place, and time. He appears well-developed and well-nourished. No distress.  HENT:  Head: Normocephalic and atraumatic.  Right Ear: Hearing normal.  Left Ear: Hearing normal.  Nose: Nose normal.  Eyes: Conjunctivae and lids are normal. Right eye exhibits no discharge. Left eye exhibits no discharge. No scleral icterus.  Cardiovascular: Normal rate, regular rhythm and normal heart sounds.  Exam reveals no gallop and no friction rub.   No murmur heard. Pulmonary/Chest: Effort normal and breath sounds normal. No respiratory distress. He has no wheezes. He has no rales. He exhibits no tenderness.  Musculoskeletal: Normal range of motion.  Neurological: He is alert and oriented to person, place, and time.  Skin: Skin is warm, dry and intact. No rash noted. No erythema. No pallor.  Psychiatric: He has a normal mood and affect. His speech is normal and behavior is normal. Judgment and thought content normal. Cognition and memory are normal.  Nursing note and vitals reviewed.   Results for orders placed or performed  in visit on 12/21/14  Bayer DCA Hb A1c Waived  Result Value Ref Range   Bayer DCA Hb A1c Waived 6.4 <7.0 %      Assessment & Plan:   Problem List Items Addressed This Visit      Cardiovascular and Mediastinum   Hypertension    Didn't tolerate lisinopril 5mg . Will start low  dose HCTZ. BP still out of control. Follow up 1 month.       Relevant Medications   hydrochlorothiazide (HYDRODIURIL) 12.5 MG tablet     Other   Acute exacerbation of chronic low back pain - Primary    Easing back down to normal. Thinks that he is going to need medication long term. Discussed that he should really see pain management if he is going to need medication long term. He is OK with that. Referral to pain management made today. We will continue pain medicine until he is evaluated by them.       Relevant Medications   HYDROcodone-acetaminophen (NORCO) 10-325 MG per tablet   Other Relevant Orders   Ambulatory referral to Pain Clinic       Follow up plan: Return in about 4 weeks (around 03/06/2015) for BP and pain follow up.

## 2015-02-06 NOTE — Assessment & Plan Note (Signed)
Easing back down to normal. Thinks that he is going to need medication long term. Discussed that he should really see pain management if he is going to need medication long term. He is OK with that. Referral to pain management made today. We will continue pain medicine until he is evaluated by them.

## 2015-02-15 ENCOUNTER — Ambulatory Visit: Payer: Medicare Other | Admitting: Anesthesiology

## 2015-02-15 ENCOUNTER — Ambulatory Visit: Payer: Medicare Other | Attending: Anesthesiology | Admitting: Anesthesiology

## 2015-02-15 ENCOUNTER — Encounter: Payer: Self-pay | Admitting: Anesthesiology

## 2015-02-15 VITALS — BP 147/78 | HR 69 | Temp 98.1°F | Resp 18 | Ht 66.0 in | Wt 173.0 lb

## 2015-02-15 DIAGNOSIS — M5136 Other intervertebral disc degeneration, lumbar region: Secondary | ICD-10-CM | POA: Insufficient documentation

## 2015-02-15 DIAGNOSIS — G8929 Other chronic pain: Secondary | ICD-10-CM | POA: Diagnosis not present

## 2015-02-15 DIAGNOSIS — I10 Essential (primary) hypertension: Secondary | ICD-10-CM

## 2015-02-15 DIAGNOSIS — M51379 Other intervertebral disc degeneration, lumbosacral region without mention of lumbar back pain or lower extremity pain: Secondary | ICD-10-CM

## 2015-02-15 DIAGNOSIS — M5417 Radiculopathy, lumbosacral region: Secondary | ICD-10-CM

## 2015-02-15 DIAGNOSIS — I2510 Atherosclerotic heart disease of native coronary artery without angina pectoris: Secondary | ICD-10-CM | POA: Diagnosis not present

## 2015-02-15 DIAGNOSIS — M545 Low back pain, unspecified: Secondary | ICD-10-CM

## 2015-02-15 DIAGNOSIS — M5416 Radiculopathy, lumbar region: Secondary | ICD-10-CM | POA: Insufficient documentation

## 2015-02-15 DIAGNOSIS — M5137 Other intervertebral disc degeneration, lumbosacral region: Secondary | ICD-10-CM

## 2015-02-15 DIAGNOSIS — Z87828 Personal history of other (healed) physical injury and trauma: Secondary | ICD-10-CM | POA: Insufficient documentation

## 2015-02-15 MED ORDER — TIZANIDINE HCL 4 MG PO TABS: 4.0000 mg | ORAL_TABLET | Freq: Four times a day (QID) | ORAL | Status: DC | PRN

## 2015-02-15 MED ORDER — MELOXICAM 15 MG PO TABS: 7.5000 mg | ORAL_TABLET | Freq: Two times a day (BID) | ORAL | Status: DC

## 2015-02-15 NOTE — Progress Notes (Signed)
Safety precautions to be maintained throughout the outpatient stay will include: orient to surroundings, keep bed in low position, maintain call bell within reach at all times, provide assistance with transfer out of bed and ambulation.  
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
## 2015-02-15 NOTE — Patient Instructions (Signed)
Epidural Steroid Injection Patient Information  Description: The epidural space surrounds the nerves as they exit the spinal cord.  In some patients, the nerves can be compressed and inflamed by a bulging disc or a tight spinal canal (spinal stenosis).  By injecting steroids into the epidural space, we can bring irritated nerves into direct contact with a potentially helpful medication.  These steroids act directly on the irritated nerves and can reduce swelling and inflammation which often leads to decreased pain.  Epidural steroids may be injected anywhere along the spine and from the neck to the low back depending upon the location of your pain.   After numbing the skin with local anesthetic (like Novocaine), a small needle is passed into the epidural space slowly.  You may experience a sensation of pressure while this is being done.  The entire block usually last less than 10 minutes.  Conditions which may be treated by epidural steroids:   Low back and leg pain  Neck and arm pain  Spinal stenosis  Post-laminectomy syndrome  Herpes zoster (shingles) pain  Pain from compression fractures  Preparation for the injection:  1. Do not eat any solid food or dairy products within 6 hours of your appointment.  2. You may drink clear liquids up to 2 hours before appointment.  Clear liquids include water, black coffee, juice or soda.  No milk or cream please. 3. You may take your regular medication, including pain medications, with a sip of water before your appointment  Diabetics should hold regular insulin (if taken separately) and take 1/2 normal NPH dos the morning of the procedure.  Carry some sugar containing items with you to your appointment. 4. A driver must accompany you and be prepared to drive you home after your procedure.  5. Bring all your current medications with your. 6. An IV may be inserted and sedation may be given at the discretion of the physician.   7. A blood pressure  cuff, EKG and other monitors will often be applied during the procedure.  Some patients may need to have extra oxygen administered for a short period. 8. You will be asked to provide medical information, including your allergies, prior to the procedure.  We must know immediately if you are taking blood thinners (like Coumadin/Warfarin)  Or if you are allergic to IV iodine contrast (dye). We must know if you could possible be pregnant.  Possible side-effects:  Bleeding from needle site  Infection (rare, may require surgery)  Nerve injury (rare)  Numbness & tingling (temporary)  Difficulty urinating (rare, temporary)  Spinal headache ( a headache worse with upright posture)  Light -headedness (temporary)  Pain at injection site (several days)  Decreased blood pressure (temporary)  Weakness in arm/leg (temporary)  Pressure sensation in back/neck (temporary)  Call if you experience:  Fever/chills associated with headache or increased back/neck pain.  Headache worsened by an upright position.  New onset weakness or numbness of an extremity below the injection site  Hives or difficulty breathing (go to the emergency room)  Inflammation or drainage at the infection site  Severe back/neck pain  Any new symptoms which are concerning to you  Please note:  Although the local anesthetic injected can often make your back or neck feel good for several hours after the injection, the pain will likely return.  It takes 3-7 days for steroids to work in the epidural space.  You may not notice any pain relief for at least that one week.    If effective, we will often do a series of three injections spaced 3-6 weeks apart to maximally decrease your pain.  After the initial series, we generally will wait several months before considering a repeat injection of the same type.  If you have any questions, please call [phone] [facility] 

## 2015-02-17 ENCOUNTER — Ambulatory Visit: Payer: Medicare Other | Admitting: Anesthesiology

## 2015-03-02 ENCOUNTER — Other Ambulatory Visit: Payer: Self-pay | Admitting: Anesthesiology

## 2015-03-03 ENCOUNTER — Ambulatory Visit: Payer: Medicare Other | Admitting: Family Medicine

## 2015-03-14 ENCOUNTER — Encounter: Payer: Self-pay | Admitting: Family Medicine

## 2015-03-14 ENCOUNTER — Ambulatory Visit (INDEPENDENT_AMBULATORY_CARE_PROVIDER_SITE_OTHER): Payer: Medicare Other | Admitting: Family Medicine

## 2015-03-14 VITALS — BP 129/73 | HR 73 | Temp 98.2°F | Wt 174.0 lb

## 2015-03-14 DIAGNOSIS — W57XXXA Bitten or stung by nonvenomous insect and other nonvenomous arthropods, initial encounter: Secondary | ICD-10-CM

## 2015-03-14 DIAGNOSIS — M545 Low back pain: Secondary | ICD-10-CM

## 2015-03-14 DIAGNOSIS — G8929 Other chronic pain: Secondary | ICD-10-CM

## 2015-03-14 DIAGNOSIS — T148 Other injury of unspecified body region: Secondary | ICD-10-CM | POA: Diagnosis not present

## 2015-03-14 MED ORDER — TRIAMCINOLONE ACETONIDE 0.1 % EX CREA: 1.0000 "application " | TOPICAL_CREAM | Freq: Two times a day (BID) | CUTANEOUS | Status: DC

## 2015-03-14 NOTE — Progress Notes (Signed)
BP 129/73 mmHg  Pulse 73  Temp(Src) 98.2 F (36.8 C)  Wt 174 lb (78.926 kg)  SpO2 97%   Subjective:    Patient ID: Nathan Fields, male    DOB: Sep 28, 1950, 64 y.o.   MRN: [id number]  HPI: Nathan Fields is a 64 y.o. male  Chief Complaint  Patient presents with  . Back Pain    patient states that the epidural did not work, he came out shaking all over, this is the same thing that happened to him 9-10years ago when he had this done. He states that he offered him ibuprofen 800mg . He states that he still has a few of the pain pills and muscle relaxers left that you had given him.  . Rash    Patient has been bitten by ants on his left leg, he would like something to help the the itching   He decided not to do the epidural because he had done them previously and they did not help. He did not pick up the tizanidine or the mobic because he states that he had taken them previously and did not find them helpful. he did not keep his follow up appointment with Dr. Idelia Salm, and does not intend to go back. He states that his back pain is stable at this time, but he continues to have weakness and paresthesias and a foot drop in his R foot. He states that his soma and his percocet work well for him.   He also notes that he got into a nest of ants over the weekend and they bit him up. He has been using some cortisone on it, which seemed like it helped, but they are still really itchy and bothering him a lot. He would like something to help with the symptoms.    Relevant past medical, surgical, family and social history reviewed and updated as indicated. Interim medical history since our last visit reviewed. Allergies and medications reviewed and updated.  Review of Systems  Constitutional: Negative.   Respiratory: Negative.   Cardiovascular: Negative.   Musculoskeletal: Positive for back pain and gait problem. Negative for myalgias, joint swelling, arthralgias, neck pain and neck stiffness.   Psychiatric/Behavioral: Negative.     Per HPI unless specifically indicated above     Objective:    BP 129/73 mmHg  Pulse 73  Temp(Src) 98.2 F (36.8 C)  Wt 174 lb (78.926 kg)  SpO2 97%  Wt Readings from Last 3 Encounters:  03/14/15 174 lb (78.926 kg)  02/15/15 173 lb (78.472 kg)  02/06/15 170 lb 1.6 oz (77.157 kg)    Physical Exam  Constitutional: He is oriented to person, place, and time. He appears well-developed and well-nourished. No distress.  HENT:  Head: Normocephalic and atraumatic.  Right Ear: Hearing normal.  Left Ear: Hearing normal.  Nose: Nose normal.  Eyes: Conjunctivae and lids are normal. Right eye exhibits no discharge. Left eye exhibits no discharge. No scleral icterus.  Cardiovascular: Normal rate, regular rhythm, normal heart sounds and intact distal pulses.  Exam reveals no gallop and no friction rub.   No murmur heard. Pulmonary/Chest: Effort normal. No respiratory distress. He has wheezes. He has no rales. He exhibits no tenderness.  Musculoskeletal: He exhibits tenderness. He exhibits no edema.  Neurological: He is alert and oriented to person, place, and time.  Skin: Skin is warm, dry and intact. Rash noted. No erythema. No pallor.  Numerous erythematous, excoriated erruptions on is legs and arms  Psychiatric: He has  a normal mood and affect. His speech is normal and behavior is normal. Judgment and thought content normal. Cognition and memory are normal.  Nursing note and vitals reviewed.   Results for orders placed or performed in visit on 12/21/14  Bayer DCA Hb A1c Waived  Result Value Ref Range   Bayer DCA Hb A1c Waived 6.4 <7.0 %      Assessment & Plan:   Problem List Items Addressed This Visit      Other   Acute exacerbation of chronic low back pain - Primary    Discussed with patient again, that as he would like to stay on long term narcotic medication, we would like him to see pain management. He does not want to do any more  injections. He would be willing to speak to neurosurgery about his options and whether there is some other option to help with his symptoms.      Relevant Medications   carisoprodol (SOMA) 350 MG tablet   Other Relevant Orders   Ambulatory referral to Neurosurgery    Other Visit Diagnoses    Bug bites        Will treat with triamcinalone cream. Call if not getting better or getting worse.         Follow up plan: Return in about 3 months (around 06/13/2015) for DM follow up.

## 2015-03-14 NOTE — Assessment & Plan Note (Signed)
Discussed with patient again, that as he would like to stay on long term narcotic medication, we would like him to see pain management. He does not want to do any more injections. He would be willing to speak to neurosurgery about his options and whether there is some other option to help with his symptoms.

## 2015-03-14 NOTE — Patient Instructions (Signed)
Pursed Lip Breathing Some long-term respiratory conditions like COPD and asthma can make it hard to release all the air in your lungs when you breathe out (exhale). This can result in oxygen-poor air building up in your lungs (air trapping) and make it hard to fill your lungs with fresh air during each breath. This can also cause you to feel short of breath.  Pursed lip breathing can help to relieve some of this shortness of breath. By keeping your airways open for a longer amount of time during your breath out, you can empty more air out of your lungs. This will make more space for fresh air during the next breath in (inhalation).  HOW TO PERFORM PURSED LIP BREATHING 1. Close your mouth. 2. Breathe in through your nose, taking a normal-sized breath. The rate of breathing in may be your normal rate, or you can try breathing in with a slow count to two or three if you feel you are not getting enough air. 3. Pucker your lips as if you were going to whistle. 4. Gently tighten your stomach muscles to help push the air out. 5. Breathe out slowly through your pursed lips. You should breathe out at least twice as long as you breathe in. 6.  Be sure you breathe out all of the air, but do not force the air out. GET HELP IF: Your shortness of breath gets worse. GET HELP RIGHT AWAY IF:  You are very short of breath. Document Released: 03/26/2008 Document Revised: 11/01/2013 Document Reviewed: 01/27/2013 [employer] Patient Information 2015 [employer]. This information is not intended to replace advice given to you by your health care provider. Make sure you discuss any questions you have with your health care provider.  

## 2015-04-02 ENCOUNTER — Other Ambulatory Visit: Payer: Self-pay | Admitting: Family Medicine

## 2015-06-13 ENCOUNTER — Encounter: Payer: Self-pay | Admitting: Family Medicine

## 2015-06-13 ENCOUNTER — Ambulatory Visit (INDEPENDENT_AMBULATORY_CARE_PROVIDER_SITE_OTHER): Payer: Medicare Other | Admitting: Family Medicine

## 2015-06-13 VITALS — BP 156/72 | HR 68 | Temp 98.0°F | Ht 66.0 in | Wt 173.0 lb

## 2015-06-13 DIAGNOSIS — M545 Low back pain, unspecified: Secondary | ICD-10-CM

## 2015-06-13 DIAGNOSIS — I10 Essential (primary) hypertension: Secondary | ICD-10-CM

## 2015-06-13 DIAGNOSIS — E785 Hyperlipidemia, unspecified: Secondary | ICD-10-CM | POA: Diagnosis not present

## 2015-06-13 DIAGNOSIS — M21371 Foot drop, right foot: Secondary | ICD-10-CM | POA: Diagnosis not present

## 2015-06-13 DIAGNOSIS — G8929 Other chronic pain: Secondary | ICD-10-CM | POA: Diagnosis not present

## 2015-06-13 DIAGNOSIS — E119 Type 2 diabetes mellitus without complications: Secondary | ICD-10-CM | POA: Diagnosis not present

## 2015-06-13 LAB — MICROALBUMIN, URINE WAIVED
Creatinine, Urine Waived: 200 mg/dL (ref 10–300)
MICROALB, UR WAIVED: 80 mg/L — AB (ref 0–19)

## 2015-06-13 LAB — BAYER DCA HB A1C WAIVED: HB A1C (BAYER DCA - WAIVED): 6.2 % (ref <7.0)

## 2015-06-13 MED ORDER — HYDROCHLOROTHIAZIDE 12.5 MG PO TABS: 12.5000 mg | ORAL_TABLET | Freq: Every day | ORAL | Status: DC

## 2015-06-13 MED ORDER — PRAVASTATIN SODIUM 20 MG PO TABS: 20.0000 mg | ORAL_TABLET | Freq: Every day | ORAL | Status: DC

## 2015-06-13 NOTE — Progress Notes (Signed)
BP 156/72 mmHg  Pulse 68  Temp(Src) 98 F (36.7 C)  Ht 5\' 6"  (1.676 m)  Wt 173 lb (78.472 kg)  BMI 27.94 kg/m2  SpO2 97%   Subjective:    Patient ID: Nathan Fields, male    DOB: 1951-05-14, 64 y.o.   MRN: [id number]  HPI: Nathan Fields is a 64 y.o. male  Chief Complaint  Patient presents with  . Diabetes  . Back Pain   DIABETES Hypoglycemic episodes:no Polydipsia/polyuria: yes Visual disturbance: yes Chest pain: no Paresthesias: no only in the leg Glucose Monitoring: no  Accucheck frequency: Not Checking Taking Insulin?: no Blood Pressure Monitoring: not checking Retinal Examination: Up to Date Foot Exam: Done today Diabetic Education: Not Completed Pneumovax: refused Influenza: refused Aspirin: yes   HYPERTENSION Hypertension status: uncontrolled  Satisfied with current treatment? no Duration of hypertension: chronic BP monitoring frequency:  not checking BP medication side effects:  yes- irritibility Medication compliance: poor compliance Previous BP meds: lisinopril 5mg , hctz- not sure why he stopped it.  Aspirin: no Recurrent headaches: no Visual changes: yes Palpitations: yes Dyspnea: no Chest pain: no Lower extremity edema: no Dizzy/lightheaded: no  BACK PAIN- does not want any more epidurals, did not do well with them. Had been willing to go see neurosurgery last visit, but refused appointment when contacted by them. He states that he doesn't remember them contacting him. Doesn't want to try gabapentin.  Duration: chronic, has been acting up the past 2-3 weeks Mechanism of injury: lifting Location: bilateral and low back Onset: sudden Severity: severe Quality: sharp and aching Frequency: constant Radiation: R leg below the knee Aggravating factors: lifting, movement, walking, laying, bending and prolonged sitting Alleviating factors: rest, heat, laying, narcotics and muscle relaxer Status: better Treatments attempted: ice, heat, APAP,  ibuprofen, aleve and physical therapy  Relief with NSAIDs?: moderate Nighttime pain:  no Paresthesias / decreased sensation:  yes Bowel / bladder incontinence:  no Fevers:  no Dysuria / urinary frequency:  no   Relevant past medical, surgical, family and social history reviewed and updated as indicated. Interim medical history since our last visit reviewed. Allergies and medications reviewed and updated.  Review of Systems  Respiratory: Negative.   Cardiovascular: Negative.   Gastrointestinal: Positive for abdominal pain, blood in stool and anal bleeding. Negative for nausea, vomiting, diarrhea, constipation, abdominal distention and rectal pain.  Musculoskeletal: Positive for myalgias, back pain and gait problem. Negative for joint swelling, arthralgias, neck pain and neck stiffness.  Neurological: Positive for weakness and numbness. Negative for dizziness, tremors, seizures, syncope, facial asymmetry, speech difficulty, light-headedness and headaches.  Psychiatric/Behavioral: Negative.     Per HPI unless specifically indicated above     Objective:    BP 156/72 mmHg  Pulse 68  Temp(Src) 98 F (36.7 C)  Ht 5\' 6"  (1.676 m)  Wt 173 lb (78.472 kg)  BMI 27.94 kg/m2  SpO2 97%  Wt Readings from Last 3 Encounters:  06/13/15 173 lb (78.472 kg)  03/14/15 174 lb (78.926 kg)  02/15/15 173 lb (78.472 kg)    Physical Exam  Constitutional: He is oriented to person, place, and time. He appears well-developed and well-nourished. No distress.  HENT:  Head: Normocephalic and atraumatic.  Right Ear: Hearing and external ear normal.  Left Ear: Hearing and external ear normal.  Nose: Nose normal.  Mouth/Throat: Oropharynx is clear and moist. No oropharyngeal exudate.  Eyes: Conjunctivae and lids are normal. Right eye exhibits no discharge. Left eye exhibits no discharge. No  scleral icterus.  Cardiovascular: Normal rate, regular rhythm, normal heart sounds and intact distal pulses.  Exam  reveals no gallop and no friction rub.   No murmur heard. Pulmonary/Chest: Effort normal and breath sounds normal. No respiratory distress. He has no wheezes. He has no rales. He exhibits no tenderness.  Abdominal: Soft. Bowel sounds are normal. He exhibits no distension and no mass. There is no tenderness. There is no rebound and no guarding.  Musculoskeletal: He exhibits tenderness. He exhibits no edema.  Neurological: He is alert and oriented to person, place, and time.  Skin: Skin is warm, dry and intact. No rash noted. No erythema. No pallor.  Psychiatric: He has a normal mood and affect. His speech is normal and behavior is normal. Judgment and thought content normal. Cognition and memory are normal.  Nursing note and vitals reviewed.   Results for orders placed or performed in visit on 12/21/14  Bayer DCA Hb A1c Waived  Result Value Ref Range   Bayer DCA Hb A1c Waived 6.4 <7.0 %      Assessment & Plan:   Problem List Items Addressed This Visit      Cardiovascular and Mediastinum   Hypertension    Not under good control. Has not been taking his medicine. Restart HCTZ and recheck in 1 month.       Relevant Medications   pravastatin (PRAVACHOL) 20 MG tablet   hydrochlorothiazide (HYDRODIURIL) 12.5 MG tablet     Endocrine   Diabetes mellitus without complication ([place]) - Primary    Under good control. Back to 6.2. Continue to monitor. Recheck 6 months.       Relevant Medications   pravastatin (PRAVACHOL) 20 MG tablet     Other   Acute exacerbation of chronic low back pain    Does not want to try any neuroleptic medicines. Doesn't want to do different muscle relaxers. Doesn't want to do injections. Would consider seeing neurosurgery, does not remember being contacted by them in the past. Referral put in again today. They should speak to his wife who handles his appointments when they call. Check back in in 1 month. Continue to monitor.       Hyperlipidemia    Refill of  his medicine given today. Continue current regimen. Continue to monitor.       Relevant Medications   pravastatin (PRAVACHOL) 20 MG tablet   hydrochlorothiazide (HYDRODIURIL) 12.5 MG tablet   Right foot drop    Does not want to do injections. Would talk to neurosurgery. Referral put back in again today. They should speak to his wife who handles his appointments.       Relevant Orders   Ambulatory referral to Neurosurgery       Follow up plan: Return in about 4 weeks (around 07/11/2015) for follow up BP and back.

## 2015-06-13 NOTE — Assessment & Plan Note (Signed)
Not under good control. Has not been taking his medicine. Restart HCTZ and recheck in 1 month.

## 2015-06-13 NOTE — Assessment & Plan Note (Signed)
Under good control. Back to 6.2. Continue to monitor. Recheck 6 months.

## 2015-06-13 NOTE — Assessment & Plan Note (Signed)
Does not want to do injections. Would talk to neurosurgery. Referral put back in again today. They should speak to his wife who handles his appointments.

## 2015-06-13 NOTE — Assessment & Plan Note (Signed)
Refill of his medicine given today. Continue current regimen. Continue to monitor.

## 2015-06-13 NOTE — Assessment & Plan Note (Signed)
Does not want to try any neuroleptic medicines. Doesn't want to do different muscle relaxers. Doesn't want to do injections. Would consider seeing neurosurgery, does not remember being contacted by them in the past. Referral put in again today. They should speak to his wife who handles his appointments when they call. Check back in in 1 month. Continue to monitor.

## 2015-06-14 ENCOUNTER — Encounter: Payer: Self-pay | Admitting: Family Medicine

## 2015-06-14 LAB — BASIC METABOLIC PANEL
BUN / CREAT RATIO: 16 (ref 10–22)
BUN: 17 mg/dL (ref 8–27)
CO2: 26 mmol/L (ref 18–29)
CREATININE: 1.04 mg/dL (ref 0.76–1.27)
Calcium: 10.2 mg/dL (ref 8.6–10.2)
Chloride: 103 mmol/L (ref 96–106)
GFR, EST AFRICAN AMERICAN: 88 mL/min/{1.73_m2} (ref >59)
GFR, EST NON AFRICAN AMERICAN: 76 mL/min/{1.73_m2} (ref >59)
Glucose: 87 mg/dL (ref 65–99)
Potassium: 5 mmol/L (ref 3.5–5.2)
Sodium: 143 mmol/L (ref 134–144)

## 2015-06-16 ENCOUNTER — Encounter: Payer: Self-pay | Admitting: Family Medicine

## 2015-08-16 ENCOUNTER — Encounter: Payer: Self-pay | Admitting: Family Medicine

## 2015-08-16 ENCOUNTER — Other Ambulatory Visit: Payer: Self-pay | Admitting: Family Medicine

## 2015-08-16 NOTE — Telephone Encounter (Signed)
apt 

## 2015-09-18 NOTE — Telephone Encounter (Signed)
Letter sent.

## 2015-11-21 ENCOUNTER — Encounter: Payer: Self-pay | Admitting: Family Medicine

## 2015-11-21 ENCOUNTER — Ambulatory Visit (INDEPENDENT_AMBULATORY_CARE_PROVIDER_SITE_OTHER): Payer: Medicare Other | Admitting: Family Medicine

## 2015-11-21 ENCOUNTER — Telehealth: Payer: Self-pay | Admitting: Family Medicine

## 2015-11-21 VITALS — BP 156/74 | HR 64 | Temp 98.3°F | Ht 66.0 in | Wt 166.2 lb

## 2015-11-21 DIAGNOSIS — I10 Essential (primary) hypertension: Secondary | ICD-10-CM | POA: Diagnosis not present

## 2015-11-21 DIAGNOSIS — G8929 Other chronic pain: Secondary | ICD-10-CM

## 2015-11-21 DIAGNOSIS — E785 Hyperlipidemia, unspecified: Secondary | ICD-10-CM

## 2015-11-21 DIAGNOSIS — E119 Type 2 diabetes mellitus without complications: Secondary | ICD-10-CM | POA: Diagnosis not present

## 2015-11-21 DIAGNOSIS — M545 Low back pain: Secondary | ICD-10-CM

## 2015-11-21 LAB — HEMOGLOBIN A1C: Hemoglobin A1C: 6.3

## 2015-11-21 LAB — LP+ALT+AST PICCOLO, WAIVED
ALT (SGPT) Piccolo, Waived: 20 U/L (ref 10–47)
AST (SGOT) Piccolo, Waived: 29 U/L (ref 11–38)
Chol/HDL Ratio Piccolo,Waive: 4.7 mg/dL
Cholesterol Piccolo, Waived: 243 mg/dL — ABNORMAL HIGH (ref <200)
HDL Chol Piccolo, Waived: 51 mg/dL — ABNORMAL LOW (ref >59)
LDL CHOL CALC PICCOLO WAIVED: 148 mg/dL — AB (ref <100)
Triglycerides Piccolo,Waived: 218 mg/dL — ABNORMAL HIGH (ref <150)
VLDL CHOL CALC PICCOLO,WAIVE: 44 mg/dL — AB (ref <30)

## 2015-11-21 LAB — BAYER DCA HB A1C WAIVED: HB A1C (BAYER DCA - WAIVED): 6.3 % (ref <7.0)

## 2015-11-21 MED ORDER — LISINOPRIL 5 MG PO TABS: 5.0000 mg | ORAL_TABLET | Freq: Every day | ORAL | Status: DC

## 2015-11-21 MED ORDER — HYDROCODONE-ACETAMINOPHEN 5-325 MG PO TABS: 1.0000 | ORAL_TABLET | Freq: Four times a day (QID) | ORAL | Status: DC | PRN

## 2015-11-21 MED ORDER — CARISOPRODOL 350 MG PO TABS: 350.0000 mg | ORAL_TABLET | Freq: Four times a day (QID) | ORAL | Status: DC

## 2015-11-21 MED ORDER — CARISOPRODOL 250 MG PO TABS: 250.0000 mg | ORAL_TABLET | Freq: Four times a day (QID) | ORAL | Status: DC

## 2015-11-21 NOTE — Assessment & Plan Note (Signed)
Not under good control. Has only been taking 1/2 of his pill- will go back to whole pill and recheck next month.

## 2015-11-21 NOTE — Telephone Encounter (Signed)
carisoprodol (SOMA) 250 MG tablet Pharmacy: [id number] - [place], [name]  Patient needs the 350 mg tablet so insurance will pay for it, therefore pt wants to know if Dr. Wynetta Emery can change the Rx.

## 2015-11-21 NOTE — Assessment & Plan Note (Signed)
Not under good control. Will change to lisinopril from HCTZ. Recheck 1 month.

## 2015-11-21 NOTE — Assessment & Plan Note (Signed)
Does not want to try any neuroleptic medicines. Doesn't want to do different muscle relaxers. Doesn't want to do injections. Does not want to consider seeing neurosurgery. Discussed that we cannot do chronic pain medicine, but we will treat him this time. Rx for soma and vicodin given today. Check back in in 1 month. Continue to monitor.

## 2015-11-21 NOTE — Telephone Encounter (Signed)
Called in.

## 2015-11-21 NOTE — Assessment & Plan Note (Signed)
A1c 6.3- stable. Continue to monitor. Recheck 6 months.

## 2015-11-21 NOTE — Telephone Encounter (Signed)
Routing to provider  

## 2015-11-21 NOTE — Telephone Encounter (Signed)
OK to call in

## 2015-11-21 NOTE — Progress Notes (Signed)
BP 156/74 mmHg  Pulse 64  Temp(Src) 98.3 F (36.8 C)  Ht 5\' 6"  (1.676 m)  Wt 166 lb 3.2 oz (75.388 kg)  BMI 26.84 kg/m2  SpO2 98%   Subjective:    Patient ID: Nathan Fields, male    DOB: 07/01/1951, 65 y.o.   MRN: [id number]  HPI: Nathan Fields is a 65 y.o. male  Chief Complaint  Patient presents with  . Back Pain  . Dizziness  . Hyperlipidemia  . Diabetes   HYPERTENSION / HYPERLIPIDEMIA Satisfied with current treatment? no Duration of hypertension: chronic BP monitoring frequency: monthly BP medication side effects: yes Past BP meds:  Duration of hyperlipidemia: chronic Cholesterol medication side effects: no Cholesterol supplements: none Medication compliance: excellent compliance Aspirin: no Recent stressors: no Recurrent headaches: no Visual changes: yes Palpitations: no Dyspnea: no Chest pain: no Lower extremity edema: no Dizzy/lightheaded: yes  DIABETES Hypoglycemic episodes:no Polydipsia/polyuria: no Visual disturbance: yes Chest pain: no Paresthesias: no Glucose Monitoring: no  Accucheck frequency: Not Checking Taking Insulin?: no Blood Pressure Monitoring: weekly Retinal Examination: Not up to Date Foot Exam: Up to date Diabetic Education: Completed Pneumovax: Up to Date Influenza: Up to Date Aspirin: no  BACK PAIN-does not want any more epidurals, did not do well with them.  Duration: chronic, has been acting up the past 2-3 weeks, has acted up  Mechanism of injury: lifting Location: bilateral and low back Onset: sudden Severity: severe Quality: sharp and aching Frequency: constant Radiation: R leg below the knee Aggravating factors: lifting, movement, walking, laying, bending and prolonged sitting Alleviating factors: rest, heat, laying, narcotics and muscle relaxer Status: better Treatments attempted: ice, heat, APAP, ibuprofen, aleve and physical therapy  Relief with NSAIDs?: moderate Nighttime pain: no Paresthesias /  decreased sensation: yes Bowel / bladder incontinence: no Fevers: no Dysuria / urinary frequency: no   Relevant past medical, surgical, family and social history reviewed and updated as indicated. Interim medical history since our last visit reviewed. Allergies and medications reviewed and updated.  Review of Systems  Constitutional: Negative.   HENT: Negative.   Respiratory: Negative.   Cardiovascular: Negative.   Musculoskeletal: Positive for myalgias, back pain and gait problem. Negative for joint swelling, arthralgias, neck pain and neck stiffness.  Skin: Negative.   Neurological: Positive for dizziness. Negative for tremors, seizures, syncope, facial asymmetry, speech difficulty, weakness, light-headedness, numbness and headaches.  Psychiatric/Behavioral: Negative.     Per HPI unless specifically indicated above     Objective:    BP 156/74 mmHg  Pulse 64  Temp(Src) 98.3 F (36.8 C)  Ht 5\' 6"  (1.676 m)  Wt 166 lb 3.2 oz (75.388 kg)  BMI 26.84 kg/m2  SpO2 98%  Wt Readings from Last 3 Encounters:  11/21/15 166 lb 3.2 oz (75.388 kg)  06/13/15 173 lb (78.472 kg)  03/14/15 174 lb (78.926 kg)    Physical Exam  Constitutional: He is oriented to person, place, and time. He appears well-developed and well-nourished. No distress.  HENT:  Head: Normocephalic and atraumatic.  Right Ear: Hearing normal.  Left Ear: Hearing normal.  Nose: Nose normal.  Eyes: Conjunctivae and lids are normal. Right eye exhibits no discharge. Left eye exhibits no discharge. No scleral icterus.  Cardiovascular: Normal rate, regular rhythm, normal heart sounds and intact distal pulses.  Exam reveals no gallop and no friction rub.   No murmur heard. Pulmonary/Chest: Effort normal and breath sounds normal. No respiratory distress. He has no wheezes. He has no rales. He exhibits  no tenderness.  Musculoskeletal: Normal range of motion.  Neurological: He is alert and oriented to person, place, and  time.  Skin: Skin is warm, dry and intact. No rash noted. He is not diaphoretic. No erythema. No pallor.  Psychiatric: He has a normal mood and affect. His speech is normal and behavior is normal. Judgment and thought content normal. Cognition and memory are normal.  Nursing note and vitals reviewed.   Results for orders placed or performed in visit on 06/16/15  HM DIABETES EYE EXAM  Result Value Ref Range   HM Diabetic Eye Exam No Retinopathy No Retinopathy      Assessment & Plan:   Problem List Items Addressed This Visit      Cardiovascular and Mediastinum   Hypertension    Not under good control. Will change to lisinopril from HCTZ. Recheck 1 month.       Relevant Medications   lisinopril (PRINIVIL,ZESTRIL) 5 MG tablet   Other Relevant Orders   Basic metabolic panel     Endocrine   Diabetes mellitus without complication (HCC) - Primary    A1c 6.3- stable. Continue to monitor. Recheck 6 months.       Relevant Medications   lisinopril (PRINIVIL,ZESTRIL) 5 MG tablet   Other Relevant Orders   Hemoglobin A1c     Other   Acute exacerbation of chronic low back pain   Relevant Medications   carisoprodol (SOMA) 250 MG tablet   HYDROcodone-acetaminophen (NORCO/VICODIN) 5-325 MG tablet   Hyperlipidemia    Not under good control. Has only been taking 1/2 of his pill- will go back to whole pill and recheck next month.       Relevant Medications   lisinopril (PRINIVIL,ZESTRIL) 5 MG tablet   Other Relevant Orders   LP+ALT+AST Piccolo, Waived       Follow up plan: Return 3-4 weeks, for BP/Back/Cholesterol.

## 2015-11-22 LAB — BASIC METABOLIC PANEL
BUN/Creatinine Ratio: 15 (ref 10–24)
BUN: 17 mg/dL (ref 8–27)
CALCIUM: 10.4 mg/dL — AB (ref 8.6–10.2)
CO2: 24 mmol/L (ref 18–29)
CREATININE: 1.14 mg/dL (ref 0.76–1.27)
Chloride: 98 mmol/L (ref 96–106)
GFR calc Af Amer: 78 mL/min/{1.73_m2} (ref >59)
GFR, EST NON AFRICAN AMERICAN: 68 mL/min/{1.73_m2} (ref >59)
Glucose: 100 mg/dL — ABNORMAL HIGH (ref 65–99)
Potassium: 4 mmol/L (ref 3.5–5.2)
Sodium: 140 mmol/L (ref 134–144)

## 2015-11-22 LAB — SPECIMEN STATUS REPORT

## 2015-11-23 ENCOUNTER — Encounter: Payer: Self-pay | Admitting: Family Medicine

## 2015-12-07 ENCOUNTER — Ambulatory Visit (INDEPENDENT_AMBULATORY_CARE_PROVIDER_SITE_OTHER): Payer: Medicare Other | Admitting: Family Medicine

## 2015-12-07 ENCOUNTER — Encounter: Payer: Self-pay | Admitting: Family Medicine

## 2015-12-07 VITALS — BP 158/78 | HR 64 | Temp 98.0°F | Ht 66.0 in | Wt 167.0 lb

## 2015-12-07 DIAGNOSIS — G8929 Other chronic pain: Secondary | ICD-10-CM

## 2015-12-07 DIAGNOSIS — M545 Low back pain: Secondary | ICD-10-CM

## 2015-12-07 DIAGNOSIS — I10 Essential (primary) hypertension: Secondary | ICD-10-CM

## 2015-12-07 DIAGNOSIS — S0590XA Unspecified injury of unspecified eye and orbit, initial encounter: Secondary | ICD-10-CM | POA: Diagnosis not present

## 2015-12-07 DIAGNOSIS — E785 Hyperlipidemia, unspecified: Secondary | ICD-10-CM

## 2015-12-07 LAB — LIPID PANEL PICCOLO, WAIVED
CHOL/HDL RATIO PICCOLO,WAIVE: 4.7 mg/dL
CHOLESTEROL PICCOLO, WAIVED: 256 mg/dL — AB (ref <200)
HDL Chol Piccolo, Waived: 55 mg/dL — ABNORMAL LOW (ref >59)
LDL CHOL CALC PICCOLO WAIVED: 173 mg/dL — AB (ref <100)
TRIGLYCERIDES PICCOLO,WAIVED: 140 mg/dL (ref <150)
VLDL Chol Calc Piccolo,Waive: 28 mg/dL (ref <30)

## 2015-12-07 MED ORDER — LISINOPRIL 10 MG PO TABS: 10.0000 mg | ORAL_TABLET | Freq: Every day | ORAL | Status: DC

## 2015-12-07 NOTE — Assessment & Plan Note (Addendum)
Doing worse than previously. Not tolerating 20mg  well. Will go back to 10mg  daily for 1 week and will follow up by phone to see if he is feeling any better. If he is not, consider changing to a different statin.

## 2015-12-07 NOTE — Assessment & Plan Note (Signed)
Still not under good control. Will increase lisinopril to 10mg  and see how he is doing.

## 2015-12-07 NOTE — Patient Instructions (Signed)
Go back to the 1/2 pill of the pravastatin for the next week.   Take 2 of the lisinopril (10mg  daily) for the next week.   I'll call you in a week to see how you're feeling.

## 2015-12-07 NOTE — Assessment & Plan Note (Signed)
Resolved. Continue stretching. Continue to monitor.

## 2015-12-07 NOTE — Progress Notes (Signed)
BP 158/78 mmHg  Pulse 64  Temp(Src) 98 F (36.7 C)  Ht 5\' 6"  (1.676 m)  Wt 167 lb (75.751 kg)  BMI 26.97 kg/m2   Subjective:    Patient ID: Nathan Fields, male    DOB: Mar 28, 1951, 65 y.o.   MRN: [id number]  HPI: Nathan Fields is a 65 y.o. male  Chief Complaint  Patient presents with  . Hyperlipidemia    feels meds are making him feel bad  . Hypertension    feels meds are making him feel bad  . Back Pain   HYPERTENSION / HYPERLIPIDEMIA Satisfied with current treatment? no Duration of hypertension: chronic BP monitoring frequency: not checking BP medication side effects: yes- feels disoriented and weak Past BP meds: lisinopril Duration of hyperlipidemia: chronic Cholesterol medication side effects: yes- unsure if it was his blood pressure medicine or his cholesterol medicine Cholesterol supplements: none Past cholesterol medications: pravastatin Medication compliance: fair compliance Aspirin: yes Recent stressors: no Recurrent headaches: no Visual changes: yes Palpitations: no Dyspnea: no Chest pain: no Lower extremity edema: no Dizzy/lightheaded: no  Back has been much better. Feeling much better.   Relevant past medical, surgical, family and social history reviewed and updated as indicated. Interim medical history since our last visit reviewed. Allergies and medications reviewed and updated.  Review of Systems  Respiratory: Negative.   Cardiovascular: Negative.   Musculoskeletal: Positive for myalgias and back pain. Negative for joint swelling, arthralgias, gait problem, neck pain and neck stiffness.  Neurological: Positive for weakness and light-headedness. Negative for dizziness, tremors, seizures, syncope, facial asymmetry, speech difficulty, numbness and headaches.  Psychiatric/Behavioral: Negative.     Per HPI unless specifically indicated above     Objective:    BP 158/78 mmHg  Pulse 64  Temp(Src) 98 F (36.7 C)  Ht 5\' 6"  (1.676 m)  Wt 167  lb (75.751 kg)  BMI 26.97 kg/m2  Wt Readings from Last 3 Encounters:  12/07/15 167 lb (75.751 kg)  11/21/15 166 lb 3.2 oz (75.388 kg)  06/13/15 173 lb (78.472 kg)    Physical Exam  Constitutional: He is oriented to person, place, and time. He appears well-developed and well-nourished. No distress.  HENT:  Head: Normocephalic and atraumatic.  Right Ear: Hearing normal.  Left Ear: Hearing normal.  Nose: Nose normal.  Eyes: Conjunctivae and lids are normal. Right eye exhibits no discharge. Left eye exhibits no discharge. No scleral icterus.  Cardiovascular: Normal rate, regular rhythm, normal heart sounds and intact distal pulses.  Exam reveals no gallop and no friction rub.   No murmur heard. Pulmonary/Chest: Effort normal and breath sounds normal. No respiratory distress. He has no wheezes. He has no rales. He exhibits no tenderness.  Musculoskeletal: Normal range of motion.  Neurological: He is alert and oriented to person, place, and time.  Skin: Skin is warm, dry and intact. No rash noted. No erythema. No pallor.  Psychiatric: He has a normal mood and affect. His speech is normal and behavior is normal. Judgment and thought content normal. Cognition and memory are normal.  Nursing note and vitals reviewed.   Results for orders placed or performed in visit on 11/21/15  LP+ALT+AST Piccolo, Waived  Result Value Ref Range   ALT (SGPT) Piccolo, Waived 20 10 - 47 U/L   AST (SGOT) Piccolo, Waived 29 11 - 38 U/L   Cholesterol Piccolo, Waived 243 (H) <200 mg/dL   HDL Chol Piccolo, Waived 51 (L) >59 mg/dL   Triglycerides Piccolo,Waived 218 (H) <150  mg/dL   Chol/HDL Ratio Piccolo,Waive 4.7 mg/dL   LDL Chol Calc Piccolo Waived 148 (H) <100 mg/dL   VLDL Chol Calc Piccolo,Waive 44 (H) <30 mg/dL  Basic metabolic panel  Result Value Ref Range   Glucose 100 (H) 65 - 99 mg/dL   BUN 17 8 - 27 mg/dL   Creatinine, Ser 1.14 0.76 - 1.27 mg/dL   GFR calc non Af Amer 68 >59 mL/min/1.73   GFR calc  Af Amer 78 >59 mL/min/1.73   BUN/Creatinine Ratio 15 10 - 24   Sodium 140 134 - 144 mmol/L   Potassium 4.0 3.5 - 5.2 mmol/L   Chloride 98 96 - 106 mmol/L   CO2 24 18 - 29 mmol/L   Calcium 10.4 (H) 8.6 - 10.2 mg/dL  Bayer DCA Hb A1c Waived  Result Value Ref Range   Bayer DCA Hb A1c Waived 6.3 <7.0 %  Specimen status report  Result Value Ref Range   specimen status report Comment       Assessment & Plan:   Problem List Items Addressed This Visit      Cardiovascular and Mediastinum   Hypertension - Primary    Still not under good control. Will increase lisinopril to 10mg  and see how he is doing.       Relevant Medications   lisinopril (PRINIVIL,ZESTRIL) 10 MG tablet   Other Relevant Orders   Comprehensive metabolic panel     Other   Acute exacerbation of chronic low back pain    Resolved. Continue stretching. Continue to monitor.       Hyperlipidemia    Doing worse than previously. Not tolerating 20mg  well. Will go back to 10mg  daily for 1 week and will follow up by phone to see if he is feeling any better. If he is not, consider changing to a different statin.       Relevant Medications   lisinopril (PRINIVIL,ZESTRIL) 10 MG tablet   Other Relevant Orders   Lipid Panel Piccolo, Waived   Comprehensive metabolic panel       Follow up plan: Return in about 4 weeks (around 01/04/2016) for Follow up BP.

## 2015-12-08 LAB — COMPREHENSIVE METABOLIC PANEL
A/G RATIO: 2 (ref 1.2–2.2)
ALBUMIN: 4.5 g/dL (ref 3.6–4.8)
ALK PHOS: 86 IU/L (ref 39–117)
ALT: 16 IU/L (ref 0–44)
AST: 17 IU/L (ref 0–40)
BILIRUBIN TOTAL: 0.3 mg/dL (ref 0.0–1.2)
BUN / CREAT RATIO: 16 (ref 10–24)
BUN: 16 mg/dL (ref 8–27)
CHLORIDE: 101 mmol/L (ref 96–106)
CO2: 25 mmol/L (ref 18–29)
Calcium: 9.8 mg/dL (ref 8.6–10.2)
Creatinine, Ser: 1 mg/dL (ref 0.76–1.27)
GFR calc Af Amer: 92 mL/min/{1.73_m2} (ref >59)
GFR calc non Af Amer: 79 mL/min/{1.73_m2} (ref >59)
GLOBULIN, TOTAL: 2.3 g/dL (ref 1.5–4.5)
GLUCOSE: 94 mg/dL (ref 65–99)
POTASSIUM: 4.1 mmol/L (ref 3.5–5.2)
SODIUM: 142 mmol/L (ref 134–144)
Total Protein: 6.8 g/dL (ref 6.0–8.5)

## 2015-12-14 ENCOUNTER — Telehealth: Payer: Self-pay | Admitting: Family Medicine

## 2015-12-14 NOTE — Telephone Encounter (Signed)
-----   Message from Valerie Roys, DO sent at 12/07/2015  3:31 PM EDT ----- Call to check in on how he's feeling

## 2015-12-14 NOTE — Telephone Encounter (Signed)
Feeling much better on the 1/2 tab pravastatin. Will leave him there and recheck cholesterol next visit. If still really high, consider zetia.

## 2016-01-08 ENCOUNTER — Encounter: Payer: Self-pay | Admitting: Family Medicine

## 2016-01-08 ENCOUNTER — Ambulatory Visit (INDEPENDENT_AMBULATORY_CARE_PROVIDER_SITE_OTHER): Payer: Medicare Other | Admitting: Family Medicine

## 2016-01-08 ENCOUNTER — Other Ambulatory Visit: Payer: Self-pay | Admitting: Family Medicine

## 2016-01-08 VITALS — BP 163/82 | HR 60 | Temp 97.7°F | Ht 66.0 in | Wt 164.0 lb

## 2016-01-08 DIAGNOSIS — F32A Depression, unspecified: Secondary | ICD-10-CM

## 2016-01-08 DIAGNOSIS — I10 Essential (primary) hypertension: Secondary | ICD-10-CM

## 2016-01-08 DIAGNOSIS — E785 Hyperlipidemia, unspecified: Secondary | ICD-10-CM | POA: Diagnosis not present

## 2016-01-08 DIAGNOSIS — F329 Major depressive disorder, single episode, unspecified: Secondary | ICD-10-CM

## 2016-01-08 LAB — LIPID PANEL PICCOLO, WAIVED
CHOL/HDL RATIO PICCOLO,WAIVE: 4.6 mg/dL
Cholesterol Piccolo, Waived: 246 mg/dL — ABNORMAL HIGH (ref <200)
HDL CHOL PICCOLO, WAIVED: 53 mg/dL — AB (ref >59)
LDL Chol Calc Piccolo Waived: 162 mg/dL — ABNORMAL HIGH (ref <100)
TRIGLYCERIDES PICCOLO,WAIVED: 154 mg/dL — AB (ref <150)
VLDL CHOL CALC PICCOLO,WAIVE: 31 mg/dL — AB (ref <30)

## 2016-01-08 MED ORDER — EZETIMIBE 10 MG PO TABS
10.0000 mg | ORAL_TABLET | Freq: Every day | ORAL | Status: DC
Start: 2016-01-08 — End: 2016-02-05

## 2016-01-08 MED ORDER — DULOXETINE HCL 20 MG PO CPEP: 20.0000 mg | ORAL_CAPSULE | Freq: Every day | ORAL | Status: DC

## 2016-01-08 MED ORDER — LISINOPRIL 20 MG PO TABS: 20.0000 mg | ORAL_TABLET | Freq: Every day | ORAL | Status: DC

## 2016-01-08 NOTE — Assessment & Plan Note (Signed)
Not under good control. Will increase lisinopril to 20mg  and recheck in 1 month. CMP checked today.

## 2016-01-08 NOTE — Assessment & Plan Note (Signed)
Not under good control. Will start him on cymbalta, risks and benefits discussed. Return in 1 month for follow up.

## 2016-01-08 NOTE — Patient Instructions (Addendum)
Start the medicine (cymbalta) for your mood and to help with your back- if you start feeling funny on it, call me. Start the zetia for your cholesterol since it's still high.  Take 2 of your blood pressure medicines until you pick up the new prescription which is for the higher dose and is at the pharmacy.   Duloxetine delayed-release capsules What is this medicine? DULOXETINE (doo LOX e teen) is used to treat depression, anxiety, and different types of chronic pain. This medicine may be used for other purposes; ask your health care provider or pharmacist if you have questions. What should I tell my health care provider before I take this medicine? They need to know if you have any of these conditions: -bipolar disorder or a family history of bipolar disorder -glaucoma -kidney disease -liver disease -suicidal thoughts or a previous suicide attempt -taken medicines called MAOIs like Carbex, Eldepryl, Marplan, Nardil, and Parnate within 14 days -an unusual reaction to duloxetine, other medicines, foods, dyes, or preservatives -pregnant or trying to get pregnant -breast-feeding How should I use this medicine? Take this medicine by mouth with a glass of water. Follow the directions on the prescription label. Do not cut, crush or chew this medicine. You can take this medicine with or without food. Take your medicine at regular intervals. Do not take your medicine more often than directed. Do not stop taking this medicine suddenly except upon the advice of your doctor. Stopping this medicine too quickly may cause serious side effects or your condition may worsen. A special MedGuide will be given to you by the pharmacist with each prescription and refill. Be sure to read this information carefully each time. Talk to your pediatrician regarding the use of this medicine in children. While this drug may be prescribed for children as young as 36 years of age for selected conditions, precautions do  apply. Overdosage: If you think you have taken too much of this medicine contact a poison control center or emergency room at once. NOTE: This medicine is only for you. Do not share this medicine with others. What if I miss a dose? If you miss a dose, take it as soon as you can. If it is almost time for your next dose, take only that dose. Do not take double or extra doses. What may interact with this medicine? Do not take this medicine with any of the following medications: -certain diet drugs like dexfenfluramine, fenfluramine -desvenlafaxine -linezolid -MAOIs like Azilect, Carbex, Eldepryl, Marplan, Nardil, and Parnate -methylene blue (intravenous) -milnacipran -thioridazine -venlafaxine This medicine may also interact with the following medications: -alcohol -aspirin and aspirin-like medicines -certain antibiotics like ciprofloxacin and enoxacin -certain medicines for blood pressure, heart disease, irregular heart beat -certain medicines for depression, anxiety, or psychotic disturbances -certain medicines for migraine headache like almotriptan, eletriptan, frovatriptan, naratriptan, rizatriptan, sumatriptan, zolmitriptan -certain medicines that treat or prevent blood clots like warfarin, enoxaparin, and dalteparin -cimetidine -fentanyl -lithium -NSAIDS, medicines for pain and inflammation, like ibuprofen or naproxen -phentermine -procarbazine -sibutramine -St. John's wort -theophylline -tramadol -tryptophan This list may not describe all possible interactions. Give your health care provider a list of all the medicines, herbs, non-prescription drugs, or dietary supplements you use. Also tell them if you smoke, drink alcohol, or use illegal drugs. Some items may interact with your medicine. What should I watch for while using this medicine? Tell your doctor if your symptoms do not get better or if they get worse. Visit your doctor or health care professional  for regular checks  on your progress. Because it may take several weeks to see the full effects of this medicine, it is important to continue your treatment as prescribed by your doctor. Patients and their families should watch out for new or worsening thoughts of suicide or depression. Also watch out for sudden changes in feelings such as feeling anxious, agitated, panicky, irritable, hostile, aggressive, impulsive, severely restless, overly excited and hyperactive, or not being able to sleep. If this happens, especially at the beginning of treatment or after a change in dose, call your health care professional. Dennis Bast may get drowsy or dizzy. Do not drive, use machinery, or do anything that needs mental alertness until you know how this medicine affects you. Do not stand or sit up quickly, especially if you are an older patient. This reduces the risk of dizzy or fainting spells. Alcohol may interfere with the effect of this medicine. Avoid alcoholic drinks. This medicine can cause an increase in blood pressure. This medicine can also cause a sudden drop in your blood pressure, which may make you feel faint and increase the chance of a fall. These effects are most common when you first start the medicine or when the dose is increased, or during use of other medicines that can cause a sudden drop in blood pressure. Check with your doctor for instructions on monitoring your blood pressure while taking this medicine. Your mouth may get dry. Chewing sugarless gum or sucking hard candy, and drinking plenty of water may help. Contact your doctor if the problem does not go away or is severe. What side effects may I notice from receiving this medicine? Side effects that you should report to your doctor or health care professional as soon as possible: -allergic reactions like skin rash, itching or hives, swelling of the face, lips, or tongue -changes in blood pressure -confusion -dark urine -dizziness -fast talking and excited  feelings or actions that are out of control -fast, irregular heartbeat -fever -general ill feeling or flu-like symptoms -hallucination, loss of contact with reality -light-colored stools -loss of balance or coordination -redness, blistering, peeling or loosening of the skin, including inside the mouth -right upper belly pain -seizures -suicidal thoughts or other mood changes -trouble concentrating -trouble passing urine or change in the amount of urine -unusual bleeding or bruising -unusually weak or tired -yellowing of the eyes or skin Side effects that usually do not require medical attention (report to your doctor or health care professional if they continue or are bothersome): -blurred vision -change in appetite -change in sex drive or performance -headache -increased sweating -nausea This list may not describe all possible side effects. Call your doctor for medical advice about side effects. You may report side effects to FDA at 1-800-FDA-1088. Where should I keep my medicine? Keep out of the reach of children. Store at room temperature between 20 and 25 degrees C (68 to 77 degrees F). Throw away any unused medicine after the expiration date. NOTE: This sheet is a summary. It may not cover all possible information. If you have questions about this medicine, talk to your doctor, pharmacist, or health care provider.    2016, [employer]vier/Gold Standard. (2013-06-08 16:28:32) Ezetimibe Tablets What is this medicine? EZETIMIBE (ez ET i mibe) blocks the absorption of cholesterol from the stomach. It can help lower blood cholesterol for patients who are at risk of getting heart disease or a stroke. It is only for patients whose cholesterol level is not controlled by diet. This medicine  may be used for other purposes; ask your health care provider or pharmacist if you have questions. What should I tell my health care provider before I take this medicine? They need to know if you have any  of these conditions: -liver disease -an unusual or allergic reaction to ezetimibe, medicines, foods, dyes, or preservatives -pregnant or trying to get pregnant -breast-feeding How should I use this medicine? Take this medicine by mouth with a glass of water. Follow the directions on the prescription label. This medicine can be taken with or without food. Take your doses at regular intervals. Do not take your medicine more often than directed. Talk to your pediatrician regarding the use of this medicine in children. Special care may be needed. Overdosage: If you think you have taken too much of this medicine contact a poison control center or emergency room at once. NOTE: This medicine is only for you. Do not share this medicine with others. What if I miss a dose? If you miss a dose, take it as soon as you can. If it is almost time for your next dose, take only that dose. Do not take double or extra doses. What may interact with this medicine? Do not take this medicine with any of the following medications: -fenofibrate -gemfibrozil This medicine may also interact with the following medications: -antacids -cyclosporine -herbal medicines like red yeast rice -other medicines to lower cholesterol or triglycerides This list may not describe all possible interactions. Give your health care provider a list of all the medicines, herbs, non-prescription drugs, or dietary supplements you use. Also tell them if you smoke, drink alcohol, or use illegal drugs. Some items may interact with your medicine. What should I watch for while using this medicine? Visit your doctor or health care professional for regular checks on your progress. You will need to have your cholesterol levels checked. If you are also taking some other cholesterol medicines, you will also need to have tests to make sure your liver is working properly. Tell your doctor or health care professional if you get any unexplained muscle pain,  tenderness, or weakness, especially if you also have a fever and tiredness. You need to follow a low-cholesterol, low-fat diet while you are taking this medicine. This will decrease your risk of getting heart and blood vessel disease. Exercising and avoiding alcohol and smoking can also help. Ask your doctor or dietician for advice. What side effects may I notice from receiving this medicine? Side effects that you should report to your doctor or health care professional as soon as possible: -allergic reactions like skin rash, itching or hives, swelling of the face, lips, or tongue -dark yellow or brown urine -unusually weak or tired -yellowing of the skin or eyes Side effects that usually do not require medical attention (report to your doctor or health care professional if they continue or are bothersome): -diarrhea -dizziness -headache -stomach upset or pain This list may not describe all possible side effects. Call your doctor for medical advice about side effects. You may report side effects to FDA at 1-800-FDA-1088. Where should I keep my medicine? Keep out of the reach of children. Store at room temperature between 15 and 30 degrees C (59 and 86 degrees F). Protect from moisture. Keep container tightly closed. Throw away any unused medicine after the expiration date. NOTE: This sheet is a summary. It may not cover all possible information. If you have questions about this medicine, talk to your doctor, pharmacist, or health care  provider.    2016, [employer] Standard. (2011-12-23 15:39:09)

## 2016-01-08 NOTE — Assessment & Plan Note (Signed)
Not under good control on his current regimen. Cannot tolerate higher dose of statin. Will start him on zetia and recheck in 1 month.

## 2016-01-08 NOTE — Progress Notes (Signed)
BP 163/82 mmHg  Pulse 60  Temp(Src) 97.7 F (36.5 C)  Ht 5\' 6"  (1.676 m)  Wt 164 lb (74.39 kg)  BMI 26.48 kg/m2  SpO2 99%   Subjective:    Patient ID: Nathan Fields, male    DOB: [birth year], 65 y.o.   MRN: [id number]  HPI: Nathan Fields is a 65 y.o. male  Chief Complaint  Patient presents with  . Hypertension    patient states that he is "having trouble being interested in things lately"   . Hyperlipidemia   HYPERTENSION / HYPERLIPIDEMIA Satisfied with current treatment? no Duration of hypertension: chronic BP monitoring frequency: not checking BP medication side effects: no Past BP meds: lisinopril Duration of hyperlipidemia: chronic Cholesterol medication side effects: yes- muscle aches Cholesterol supplements: none Past cholesterol medications: pravastatin (pravachol) Medication compliance: good compliance Aspirin: yes Recent stressors: yes Recurrent headaches: no Visual changes: no Palpitations: no Dyspnea: no Chest pain: no Lower extremity edema: no Dizzy/lightheaded: no  DEPRESSION Mood status: uncontrolled Satisfied with current treatment?: no Symptom severity: moderate  Duration of current treatment : months Side effects: no Medication compliance: not on anything Psychotherapy/counseling: no  Previous psychiatric medications: none Depressed mood: yes Anxious mood: yes Anhedonia: no Significant weight loss or gain: no Insomnia: no  Fatigue: yes Feelings of worthlessness or guilt: yes Impaired concentration/indecisiveness: yes Suicidal ideations: no Hopelessness: yes Crying spells: no Depression screen [facility] 2/9 01/08/2016 02/15/2015  Decreased Interest 3 0  Down, Depressed, Hopeless 3 0  PHQ - 2 Score 6 0  Altered sleeping 0 -  Tired, decreased energy 3 -  Change in appetite 0 -  Feeling bad or failure about yourself  1 -  Trouble concentrating 3 -  Moving slowly or fidgety/restless 2 -  Suicidal thoughts 0 -  PHQ-9 Score 15 -   Difficult doing work/chores Very difficult -   Relevant past medical, surgical, family and social history reviewed and updated as indicated. Interim medical history since our last visit reviewed. Allergies and medications reviewed and updated.  Review of Systems  Constitutional: Negative.   Respiratory: Negative.   Cardiovascular: Negative.   Psychiatric/Behavioral: Positive for dysphoric mood and decreased concentration. Negative for suicidal ideas, hallucinations, behavioral problems, confusion, sleep disturbance, self-injury and agitation. The patient is nervous/anxious. The patient is not hyperactive.     Per HPI unless specifically indicated above     Objective:    BP 163/82 mmHg  Pulse 60  Temp(Src) 97.7 F (36.5 C)  Ht 5\' 6"  (1.676 m)  Wt 164 lb (74.39 kg)  BMI 26.48 kg/m2  SpO2 99%  Wt Readings from Last 3 Encounters:  01/08/16 164 lb (74.39 kg)  12/07/15 167 lb (75.751 kg)  11/21/15 166 lb 3.2 oz (75.388 kg)    Physical Exam  Constitutional: He is oriented to person, place, and time. He appears well-developed and well-nourished. No distress.  HENT:  Head: Normocephalic and atraumatic.  Right Ear: Hearing normal.  Left Ear: Hearing normal.  Nose: Nose normal.  Eyes: Conjunctivae and lids are normal. Right eye exhibits no discharge. Left eye exhibits no discharge. No scleral icterus.  Cardiovascular: Normal rate, regular rhythm, normal heart sounds and intact distal pulses.  Exam reveals no gallop and no friction rub.   No murmur heard. Pulmonary/Chest: Effort normal and breath sounds normal. No respiratory distress. He has no wheezes. He has no rales. He exhibits no tenderness.  Musculoskeletal: Normal range of motion.  Neurological: He is alert and oriented to person, place,  and time.  Skin: Skin is warm, dry and intact. No rash noted. He is not diaphoretic. No erythema. No pallor.  Psychiatric: He has a normal mood and affect. His speech is normal and behavior  is normal. Judgment and thought content normal. Cognition and memory are normal.  Nursing note and vitals reviewed.   Results for orders placed or performed in visit on 12/07/15  Lipid Panel Piccolo, [employer]  Result Value Ref Range   Cholesterol Piccolo, Waived 256 (H) <200 mg/dL   HDL Chol Piccolo, Waived 55 (L) >59 mg/dL   Triglycerides Piccolo,Waived 140 <150 mg/dL   Chol/HDL Ratio Piccolo,Waive 4.7 mg/dL   LDL Chol Calc Piccolo Waived 173 (H) <100 mg/dL   VLDL Chol Calc Piccolo,Waive 28 <30 mg/dL  Comprehensive metabolic panel  Result Value Ref Range   Glucose 94 65 - 99 mg/dL   BUN 16 8 - 27 mg/dL   Creatinine, Ser 1.00 0.76 - 1.27 mg/dL   GFR calc non Af Amer 79 >59 mL/min/1.73   GFR calc Af Amer 92 >59 mL/min/1.73   BUN/Creatinine Ratio 16 10 - 24   Sodium 142 134 - 144 mmol/L   Potassium 4.1 3.5 - 5.2 mmol/L   Chloride 101 96 - 106 mmol/L   CO2 25 18 - 29 mmol/L   Calcium 9.8 8.6 - 10.2 mg/dL   Total Protein 6.8 6.0 - 8.5 g/dL   Albumin 4.5 3.6 - 4.8 g/dL   Globulin, Total 2.3 1.5 - 4.5 g/dL   Albumin/Globulin Ratio 2.0 1.2 - 2.2   Bilirubin Total 0.3 0.0 - 1.2 mg/dL   Alkaline Phosphatase 86 39 - 117 IU/L   AST 17 0 - 40 IU/L   ALT 16 0 - 44 IU/L      Assessment & Plan:   Problem List Items Addressed This Visit      Cardiovascular and Mediastinum   Hypertension - Primary    Not under good control. Will increase lisinopril to 20mg  and recheck in 1 month. CMP checked today.      Relevant Medications   lisinopril (PRINIVIL,ZESTRIL) 20 MG tablet   ezetimibe (ZETIA) 10 MG tablet   Other Relevant Orders   Comprehensive metabolic panel     Other   Hyperlipidemia    Not under good control on his current regimen. Cannot tolerate higher dose of statin. Will start him on zetia and recheck in 1 month.       Relevant Medications   lisinopril (PRINIVIL,ZESTRIL) 20 MG tablet   ezetimibe (ZETIA) 10 MG tablet   Other Relevant Orders   Comprehensive metabolic panel    Lipid Panel Piccolo, Waived   Depression    Not under good control. Will start him on cymbalta, risks and benefits discussed. Return in 1 month for follow up.      Relevant Medications   DULoxetine (CYMBALTA) 20 MG capsule       Follow up plan: Return in about 4 weeks (around 02/05/2016) for follow up mood and cholesterol and BP.

## 2016-01-09 ENCOUNTER — Encounter: Payer: Self-pay | Admitting: Family Medicine

## 2016-01-09 LAB — COMPREHENSIVE METABOLIC PANEL
ALT: 16 IU/L (ref 0–44)
AST: 16 IU/L (ref 0–40)
Albumin/Globulin Ratio: 2 (ref 1.2–2.2)
Albumin: 4.5 g/dL (ref 3.6–4.8)
Alkaline Phosphatase: 81 IU/L (ref 39–117)
BUN/Creatinine Ratio: 15 (ref 10–24)
BUN: 16 mg/dL (ref 8–27)
Bilirubin Total: <0.2 mg/dL (ref 0.0–1.2)
CALCIUM: 9.5 mg/dL (ref 8.6–10.2)
CO2: 26 mmol/L (ref 18–29)
CREATININE: 1.09 mg/dL (ref 0.76–1.27)
Chloride: 102 mmol/L (ref 96–106)
GFR calc Af Amer: 82 mL/min/{1.73_m2} (ref >59)
GFR, EST NON AFRICAN AMERICAN: 71 mL/min/{1.73_m2} (ref >59)
Globulin, Total: 2.3 g/dL (ref 1.5–4.5)
Glucose: 88 mg/dL (ref 65–99)
Potassium: 4.6 mmol/L (ref 3.5–5.2)
Sodium: 142 mmol/L (ref 134–144)
Total Protein: 6.8 g/dL (ref 6.0–8.5)

## 2016-02-05 ENCOUNTER — Ambulatory Visit (INDEPENDENT_AMBULATORY_CARE_PROVIDER_SITE_OTHER): Payer: Medicare Other | Admitting: Family Medicine

## 2016-02-05 ENCOUNTER — Encounter: Payer: Self-pay | Admitting: Family Medicine

## 2016-02-05 VITALS — BP 150/80 | HR 62 | Temp 97.9°F | Wt 160.0 lb

## 2016-02-05 DIAGNOSIS — I10 Essential (primary) hypertension: Secondary | ICD-10-CM | POA: Diagnosis not present

## 2016-02-05 DIAGNOSIS — R5382 Chronic fatigue, unspecified: Secondary | ICD-10-CM | POA: Diagnosis not present

## 2016-02-05 DIAGNOSIS — R7301 Impaired fasting glucose: Secondary | ICD-10-CM | POA: Diagnosis not present

## 2016-02-05 DIAGNOSIS — E785 Hyperlipidemia, unspecified: Secondary | ICD-10-CM

## 2016-02-05 LAB — LP+ALT+AST PICCOLO, WAIVED
ALT (SGPT) Piccolo, Waived: 22 U/L (ref 10–47)
AST (SGOT) Piccolo, Waived: 21 U/L (ref 11–38)
CHOL/HDL RATIO PICCOLO,WAIVE: 4.5 mg/dL
Cholesterol Piccolo, Waived: 219 mg/dL — ABNORMAL HIGH (ref <200)
HDL CHOL PICCOLO, WAIVED: 49 mg/dL — AB (ref >59)
LDL CHOL CALC PICCOLO WAIVED: 138 mg/dL — AB (ref <100)
TRIGLYCERIDES PICCOLO,WAIVED: 162 mg/dL — AB (ref <150)
VLDL CHOL CALC PICCOLO,WAIVE: 32 mg/dL — AB (ref <30)

## 2016-02-05 MED ORDER — LISINOPRIL 20 MG PO TABS: 30.0000 mg | ORAL_TABLET | Freq: Every day | ORAL | 1 refills | Status: DC

## 2016-02-05 MED ORDER — NIACIN ER (ANTIHYPERLIPIDEMIC) 500 MG PO TBCR: EXTENDED_RELEASE_TABLET | ORAL | 3 refills | Status: DC

## 2016-02-05 NOTE — Patient Instructions (Addendum)
STOP pravastatin and the zetia Start 1.5 tablets of the lisinopril  I will see you in 1 month  Niacin extended-release tablets or capsules What is this medicine? NIACIN (NYE a sin) is used in combination with a healthy diet to lower bad cholesterol and increase good cholesterol. This medicine is also used to decrease triglycerides. If triglycerides are too high, you may be at risk of developing pancreatitis. This is a painful condition that causes inflammation of the pancreas and can lead to serious health problems. This medicine can also be helpful in patients who have heart disease or who have had a heart attack. This medicine may be used for other purposes; ask your health care provider or pharmacist if you have questions. What should I tell my health care provider before I take this medicine? They need to know if you have any of these conditions: -bleeding problems -frequently drink alcoholic beverages -liver disease -ulcers of intestine or stomach -an unusual or allergic reaction to niacin, other medicines, foods, dyes, or preservatives -pregnant or trying or get pregnant -breast-feeding How should I use this medicine? Take this medicine by mouth with a glass of water. Follow the directions on the prescription label. Do not crush, cut, or chew. Take with a low-fat meal or snack. Take your doses at regular intervals. Do not take your medicine more often than directed. Do not stop taking except on the advice of your doctor or health care professional. Talk to your pediatrician regarding the use of this medicine in children. Special care may be needed. Overdosage: If you think you have taken too much of this medicine contact a poison control center or emergency room at once. NOTE: This medicine is only for you. Do not share this medicine with others. What if I miss a dose? If you miss a dose, take it as soon as you can. If it is almost time for your next dose, take only that dose. Do not  take double or extra doses. What may interact with this medicine? -aspirin -medicines for blood pressure, chest pain, or heart disease -nitroglycerin -nutritional supplements that contain niacin or nicotinamide -other medicines to lower cholesterol or triglycerides This list may not describe all possible interactions. Give your health care provider a list of all the medicines, herbs, non-prescription drugs, or dietary supplements you use. Also tell them if you smoke, drink alcohol, or use illegal drugs. Some items may interact with your medicine. What should I watch for while using this medicine? Visit your doctor or health care professional for regular checks on your progress. You may need regular tests to make sure your liver is working properly. You may get drowsy or dizzy. Do not drive, use machinery, or do anything that needs mental alertness until you know how this drug affects you. Do not stand or sit up quickly, especially if you are an older patient. This reduces the risk of dizzy or fainting spells. Do not drink hot drinks or alcohol at the same time you take this medicine. Hot drinks and alcohol can increase the flushing caused by this medicine, which can be uncomfortable. Alcohol also can increase possible dizziness. This drug is only part of a total heart-health program. Your doctor or a dietician can suggest a low-cholesterol and low-fat diet to help. Avoid alcohol and smoking, and keep a proper exercise schedule. If you are diabetic, close monitoring of your blood sugars can help your blood fat levels. This medicine may change the way your diabetic medicine works, and  sometimes will require that your dosages be adjusted. Check with your doctor or health care professional. You may notice the empty shell of the tablet in your stool. This is no cause for concern. What side effects may I notice from receiving this medicine? Side effects that you should report to your doctor or health care  professional as soon as possible: -dark yellow or brown urine -fainting spells -grayish stool color -nausea, vomiting -palpitations -severe stomach pain and loss of appetite -shortness of breath, wheezing -skin rash and itching -weakness or tiredness -yellowing of skin or eyes Side effects that usually do not require medical attention (report to your doctor or health care professional if they continue or are bothersome): -diarrhea -headache -stomach discomfort or bloating This list may not describe all possible side effects. Call your doctor for medical advice about side effects. You may report side effects to FDA at 1-800-FDA-1088. Where should I keep my medicine? Keep out of the reach of children. Store at room temperature between 20 and 25 degrees C (68 and 77 degrees F). Throw away any unused medicine after the expiration date. NOTE: This sheet is a summary. It may not cover all possible information. If you have questions about this medicine, talk to your doctor, pharmacist, or health care provider.    2016, [employer]vier/[employer] Standard. (2007-09-03 16:22:37)

## 2016-02-05 NOTE — Progress Notes (Signed)
BP (!) 150/80 (BP Location: Left Arm, Patient Position: Sitting, Cuff Size: Normal)   Pulse 62   Temp 97.9 F (36.6 C)   Wt 160 lb (72.6 kg)   SpO2 97%   BMI 25.82 kg/m    Subjective:    Patient ID: Nathan Fields, male    DOB: 1950-11-16, 65 y.o.   MRN: [id number]  HPI: Nathan Fields is a 65 y.o. male  Chief Complaint  Patient presents with  . Hypertension    Patient states that since his medications have been increased he is very fatigued, shaky and not interested in anything  . Hyperlipidemia  . Depression    Patient did not start the medication, he states that he has had it in the past and had thoughts of harming himself   HYPERTENSION / HYPERLIPIDEMIA Satisfied with current treatment? no Duration of hypertension: chronic BP monitoring frequency: not checking BP medication side effects: yes- feels tired and achey Duration of hyperlipidemia: chronic Cholesterol medication side effects: yes Cholesterol supplements: none Past cholesterol medications: pravastatin, lovastatin, zetia Medication compliance: poor compliance Aspirin: yes Recent stressors: no Recurrent headaches: no Visual changes: no Palpitations: no Dyspnea: no Chest pain: no Lower extremity edema: no Dizzy/lightheaded: yes  DEPRESSION Mood status: better Satisfied with current treatment?: yes Symptom severity: moderate  Duration of current treatment : Not on anything Depressed mood: yes Anxious mood: yes Anhedonia: no Significant weight loss or gain: no Insomnia: no  Fatigue: yes Feelings of worthlessness or guilt: yes Impaired concentration/indecisiveness: yes Suicidal ideations: no Hopelessness: no Crying spells: no Depression screen [facility] 2/9 02/05/2016 01/08/2016 02/15/2015  Decreased Interest 3 3 0  Down, Depressed, Hopeless 0 3 0  PHQ - 2 Score 3 6 0  Altered sleeping 0 0 -  Tired, decreased energy 3 3 -  Change in appetite 0 0 -  Feeling bad or failure about yourself  1 1 -    Trouble concentrating 0 3 -  Moving slowly or fidgety/restless 0 2 -  Suicidal thoughts 0 0 -  PHQ-9 Score 7 15 -  Difficult doing work/chores - Very difficult -   Relevant past medical, surgical, family and social history reviewed and updated as indicated. Interim medical history since our last visit reviewed. Allergies and medications reviewed and updated.  Review of Systems  Constitutional: Negative.   Respiratory: Negative.   Cardiovascular: Negative.   Musculoskeletal: Positive for arthralgias, back pain and myalgias. Negative for gait problem, joint swelling, neck pain and neck stiffness.  Psychiatric/Behavioral: Positive for dysphoric mood. Negative for agitation, behavioral problems, confusion, decreased concentration, hallucinations, self-injury, sleep disturbance and suicidal ideas. The patient is not nervous/anxious and is not hyperactive.     Per HPI unless specifically indicated above     Objective:    BP (!) 150/80 (BP Location: Left Arm, Patient Position: Sitting, Cuff Size: Normal)   Pulse 62   Temp 97.9 F (36.6 C)   Wt 160 lb (72.6 kg)   SpO2 97%   BMI 25.82 kg/m   Wt Readings from Last 3 Encounters:  02/05/16 160 lb (72.6 kg)  01/08/16 164 lb (74.4 kg)  12/07/15 167 lb (75.8 kg)    Orthostatic VS for the past 24 hrs:  BP- Lying Pulse- Lying BP- Sitting Pulse- Sitting BP- Standing at 0 minutes Pulse- Standing at 0 minutes  02/05/16 1638 159/90 67 (!) 168/106 71 161/87 65    Physical Exam  Constitutional: He is oriented to person, place, and time. He appears  well-developed and well-nourished. No distress.  HENT:  Head: Normocephalic and atraumatic.  Right Ear: Hearing normal.  Left Ear: Hearing normal.  Nose: Nose normal.  Eyes: Conjunctivae and lids are normal. Right eye exhibits no discharge. Left eye exhibits no discharge. No scleral icterus.  Cardiovascular: Normal rate, regular rhythm, normal heart sounds and intact distal pulses.  Exam reveals  no gallop and no friction rub.   No murmur heard. Pulmonary/Chest: Effort normal and breath sounds normal. No respiratory distress. He has no wheezes. He has no rales. He exhibits no tenderness.  Musculoskeletal: Normal range of motion.  Neurological: He is alert and oriented to person, place, and time.  Skin: Skin is warm, dry and intact. No rash noted. He is not diaphoretic. No erythema. No pallor.  Psychiatric: He has a normal mood and affect. His speech is normal and behavior is normal. Judgment and thought content normal. Cognition and memory are normal.  Nursing note and vitals reviewed.   Results for orders placed or performed in visit on 02/05/16  LP+ALT+AST Piccolo, Waived  Result Value Ref Range   ALT (SGPT) Piccolo, Waived 22 10 - 47 U/L   AST (SGOT) Piccolo, Waived 21 11 - 38 U/L   Cholesterol Piccolo, Waived 219 (H) <200 mg/dL   HDL Chol Piccolo, Waived 49 (L) >59 mg/dL   Triglycerides Piccolo,Waived 162 (H) <150 mg/dL   Chol/HDL Ratio Piccolo,Waive 4.5 mg/dL   LDL Chol Calc Piccolo Waived 138 (H) <100 mg/dL   VLDL Chol Calc Piccolo,Waive 32 (H) <30 mg/dL      Assessment & Plan:   Problem List Items Addressed This Visit      Cardiovascular and Mediastinum   Hypertension    Not orthostatic. BP jumping. Will increase to 30mg  daily of lisinopril and recheck in 1 month      Relevant Medications   niacin (NIASPAN) 500 MG CR tablet   lisinopril (PRINIVIL,ZESTRIL) 20 MG tablet     Endocrine   Impaired fasting glucose    Stable. Continue diet and exercise. Continue to monitor.         Other   Hyperlipidemia - Primary    Not under good control. Cannot tolerate statins or zetia. Will try niacin. Recheck 3 months.       Relevant Medications   niacin (NIASPAN) 500 MG CR tablet   lisinopril (PRINIVIL,ZESTRIL) 20 MG tablet   Other Relevant Orders   LP+ALT+AST Piccolo, Waived (Completed)    Other Visit Diagnoses    Chronic fatigue       Will check testosterone  next visit   Relevant Orders   Testosterone, free, total       Follow up plan: Return in about 4 weeks (around 03/04/2016) for Follow up BP.

## 2016-02-05 NOTE — Assessment & Plan Note (Signed)
Not under good control. Cannot tolerate statins or zetia. Will try niacin. Recheck 3 months.

## 2016-02-05 NOTE — Assessment & Plan Note (Signed)
Not orthostatic. BP jumping. Will increase to 30mg  daily of lisinopril and recheck in 1 month

## 2016-02-05 NOTE — Assessment & Plan Note (Signed)
Stable. Continue diet and exercise. Continue to monitor.

## 2016-03-07 ENCOUNTER — Encounter: Payer: Self-pay | Admitting: Family Medicine

## 2016-03-07 ENCOUNTER — Ambulatory Visit (INDEPENDENT_AMBULATORY_CARE_PROVIDER_SITE_OTHER): Payer: Medicare Other | Admitting: Family Medicine

## 2016-03-07 VITALS — BP 134/87 | HR 85 | Temp 98.1°F | Ht 66.0 in | Wt 160.0 lb

## 2016-03-07 DIAGNOSIS — I10 Essential (primary) hypertension: Secondary | ICD-10-CM

## 2016-03-07 DIAGNOSIS — R5382 Chronic fatigue, unspecified: Secondary | ICD-10-CM | POA: Diagnosis not present

## 2016-03-07 NOTE — Assessment & Plan Note (Signed)
Under good control. Continue current regimen. Continue to monitor. Checking BMP. Recheck 6 months at physical.

## 2016-03-07 NOTE — Progress Notes (Signed)
BP 134/87 (BP Location: Left Arm, Patient Position: Sitting, Cuff Size: Normal)   Pulse 85   Temp 98.1 F (36.7 C)   Ht 5\' 6"  (1.676 m)   Wt 160 lb (72.6 kg)   SpO2 98%   BMI 25.82 kg/m    Subjective:    Patient ID: Nathan Fields, male    DOB: 1950-12-26, 65 y.o.   MRN: [id number]  HPI: Nathan Fields is a 65 y.o. male  Chief Complaint  Patient presents with  . Hypertension    Unable to reconcile patients medications, he is unsure of what he is taking, he states that he is taking a bp and cholesterol medication.   HYPERTENSION Hypertension status: controlled  Satisfied with current treatment? yes Duration of hypertension: chronic BP monitoring frequency:  not checking BP medication side effects:  no Medication compliance: excellent compliance Aspirin: no Recurrent headaches: no Visual changes: no Palpitations: no Dyspnea: no Chest pain: no Lower extremity edema: no Dizzy/lightheaded: no  Relevant past medical, surgical, family and social history reviewed and updated as indicated. Interim medical history since our last visit reviewed. Allergies and medications reviewed and updated.  Review of Systems  Constitutional: Negative.   Respiratory: Negative.   Cardiovascular: Negative.   Psychiatric/Behavioral: Negative.     Per HPI unless specifically indicated above     Objective:    BP 134/87 (BP Location: Left Arm, Patient Position: Sitting, Cuff Size: Normal)   Pulse 85   Temp 98.1 F (36.7 C)   Ht 5\' 6"  (1.676 m)   Wt 160 lb (72.6 kg)   SpO2 98%   BMI 25.82 kg/m   Wt Readings from Last 3 Encounters:  03/07/16 160 lb (72.6 kg)  02/05/16 160 lb (72.6 kg)  01/08/16 164 lb (74.4 kg)    Physical Exam  Constitutional: He is oriented to person, place, and time. He appears well-developed and well-nourished. No distress.  HENT:  Head: Normocephalic and atraumatic.  Right Ear: Hearing normal.  Left Ear: Hearing normal.  Nose: Nose normal.  Eyes:  Conjunctivae and lids are normal. Right eye exhibits no discharge. Left eye exhibits no discharge. No scleral icterus.  Cardiovascular: Normal rate, regular rhythm and intact distal pulses.  Exam reveals no gallop and no friction rub.   No murmur heard. Pulmonary/Chest: Effort normal and breath sounds normal. No respiratory distress. He has no wheezes. He has no rales. He exhibits no tenderness.  Musculoskeletal: Normal range of motion.  Neurological: He is alert and oriented to person, place, and time.  Skin: Skin is warm, dry and intact. No rash noted. He is not diaphoretic. No erythema. No pallor.  Psychiatric: He has a normal mood and affect. His speech is normal and behavior is normal. Judgment and thought content normal. Cognition and memory are normal.  Nursing note and vitals reviewed.   Results for orders placed or performed in visit on 03/07/16  Hemoglobin A1c  Result Value Ref Range   Hemoglobin A1C 6.3       Assessment & Plan:   Problem List Items Addressed This Visit      Cardiovascular and Mediastinum   Hypertension - Primary    Under good control. Continue current regimen. Continue to monitor. Checking BMP. Recheck 6 months at physical.      Relevant Orders   Basic metabolic panel    Other Visit Diagnoses    Chronic fatigue       Will check testosterone today. Await results.  Follow up plan: Return in about 6 months (around 09/04/2016) for Physical.

## 2016-03-09 LAB — TESTOSTERONE, FREE, TOTAL, SHBG
SEX HORMONE BINDING: 23.8 nmol/L (ref 19.3–76.4)
TESTOSTERONE: 198 ng/dL — AB (ref 264–916)
Testosterone, Free: 7.8 pg/mL (ref 6.6–18.1)

## 2016-03-09 LAB — BASIC METABOLIC PANEL
BUN / CREAT RATIO: 13 (ref 10–24)
BUN: 15 mg/dL (ref 8–27)
CALCIUM: 10.1 mg/dL (ref 8.6–10.2)
CHLORIDE: 100 mmol/L (ref 96–106)
CO2: 26 mmol/L (ref 18–29)
Creatinine, Ser: 1.16 mg/dL (ref 0.76–1.27)
GFR calc non Af Amer: 66 mL/min/{1.73_m2} (ref >59)
GFR, EST AFRICAN AMERICAN: 77 mL/min/{1.73_m2} (ref >59)
GLUCOSE: 97 mg/dL (ref 65–99)
POTASSIUM: 4.6 mmol/L (ref 3.5–5.2)
Sodium: 142 mmol/L (ref 134–144)

## 2016-03-12 ENCOUNTER — Encounter: Payer: Self-pay | Admitting: Family Medicine

## 2016-05-14 IMAGING — CR DG LUMBAR SPINE COMPLETE W/ BEND
1 series · 7 of 7 positions shown · non-contrast
Comparison: None.

CLINICAL DATA: Low back pain for 3 weeks, acute on chronic back
pain with radiculopathy

EXAM:
LUMBAR SPINE - COMPLETE WITH BENDING VIEWS

[Series 1: ap · 0.17mm/px · 7 of 7 slices shown]
[im 1/7]
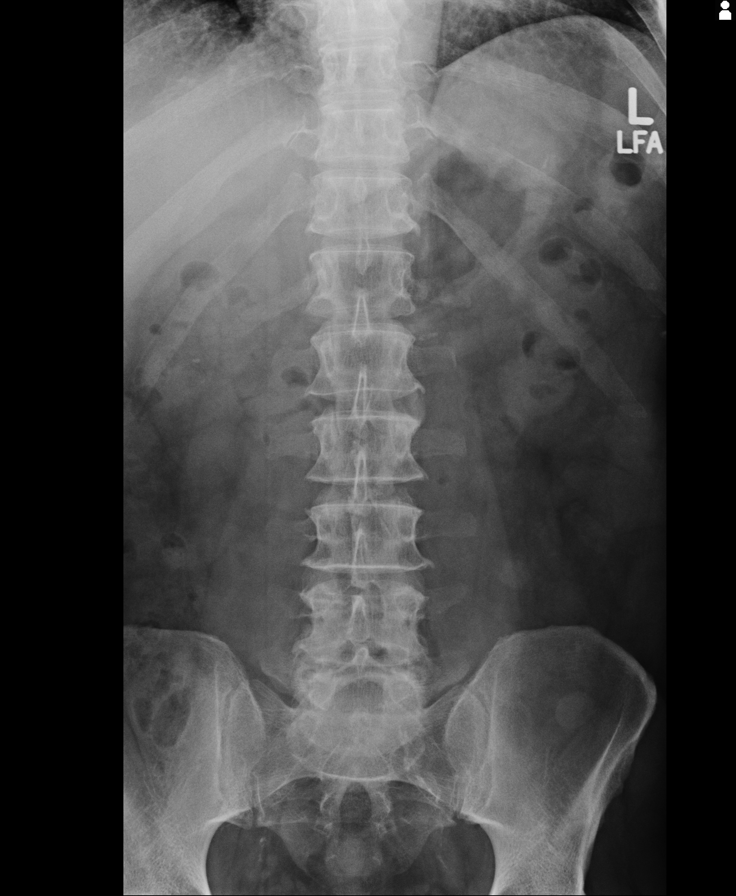
[im 2/7]
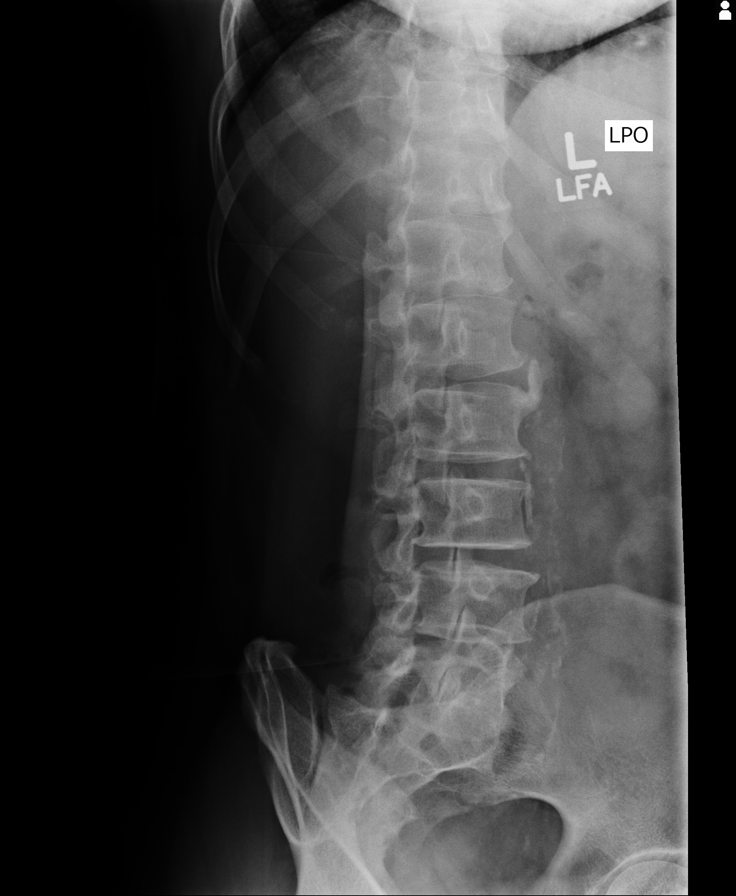
[im 3/7]
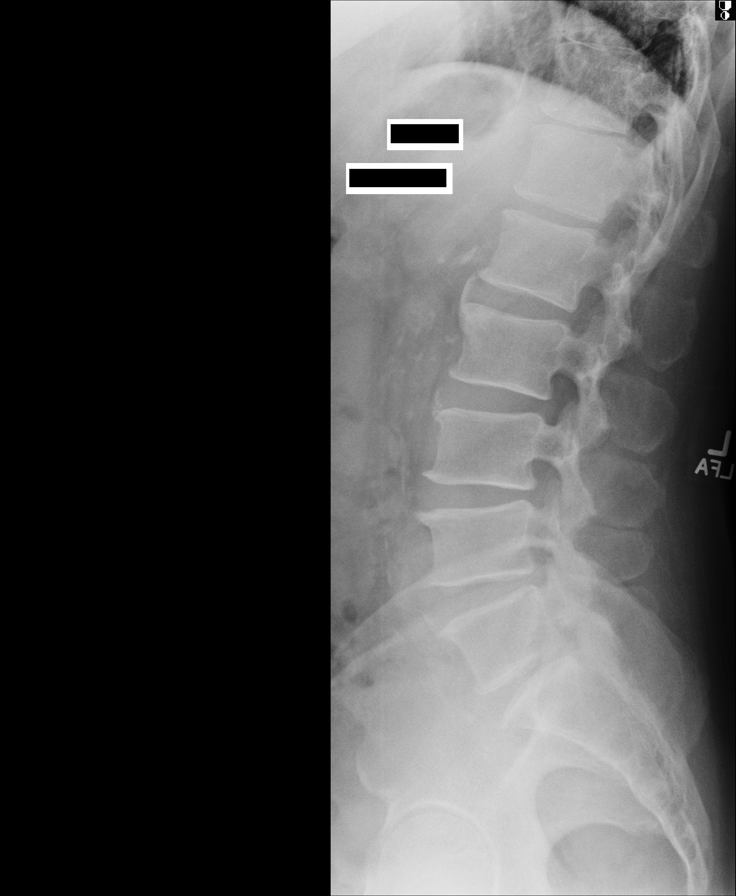
[im 4/7]
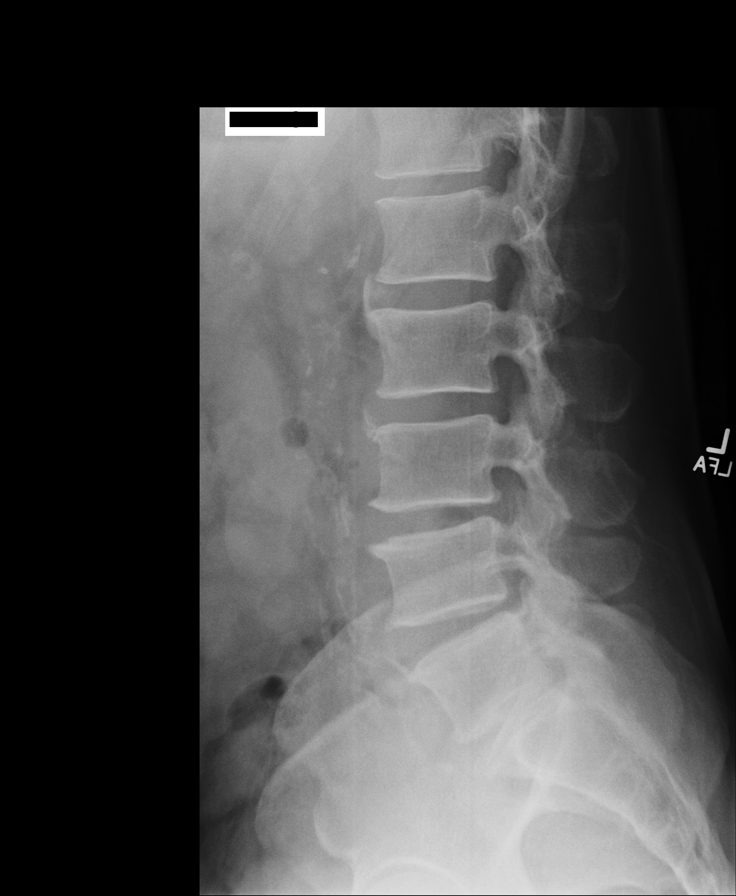
[im 5/7]
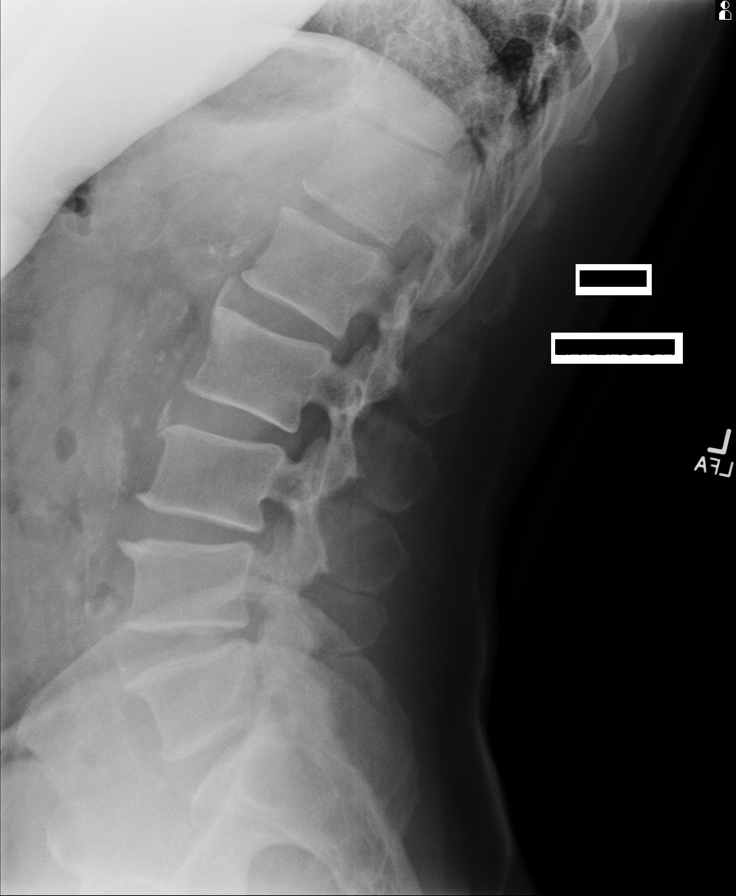
[im 6/7]
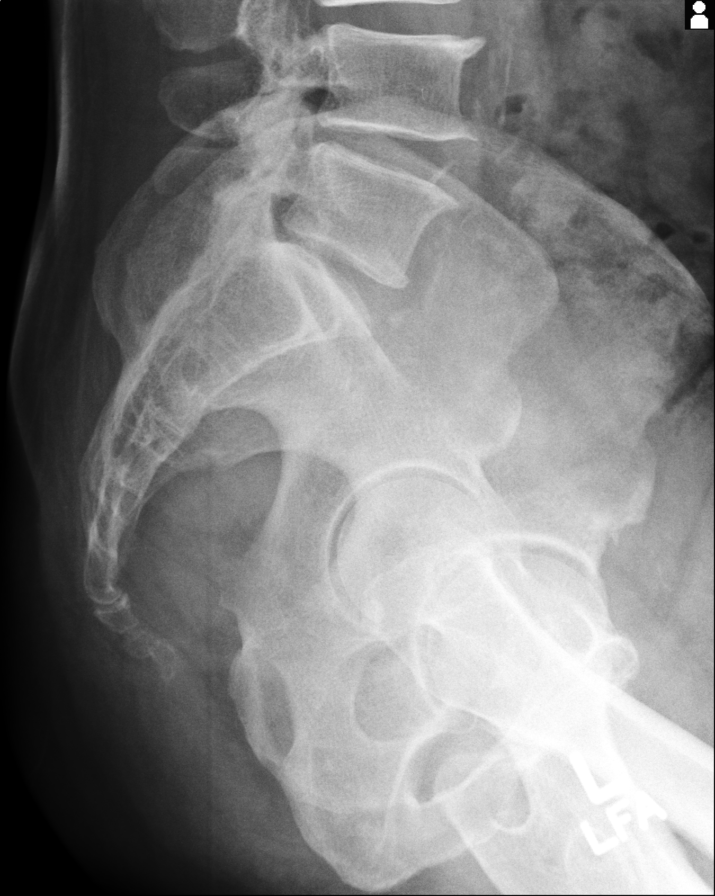
[im 7/7]
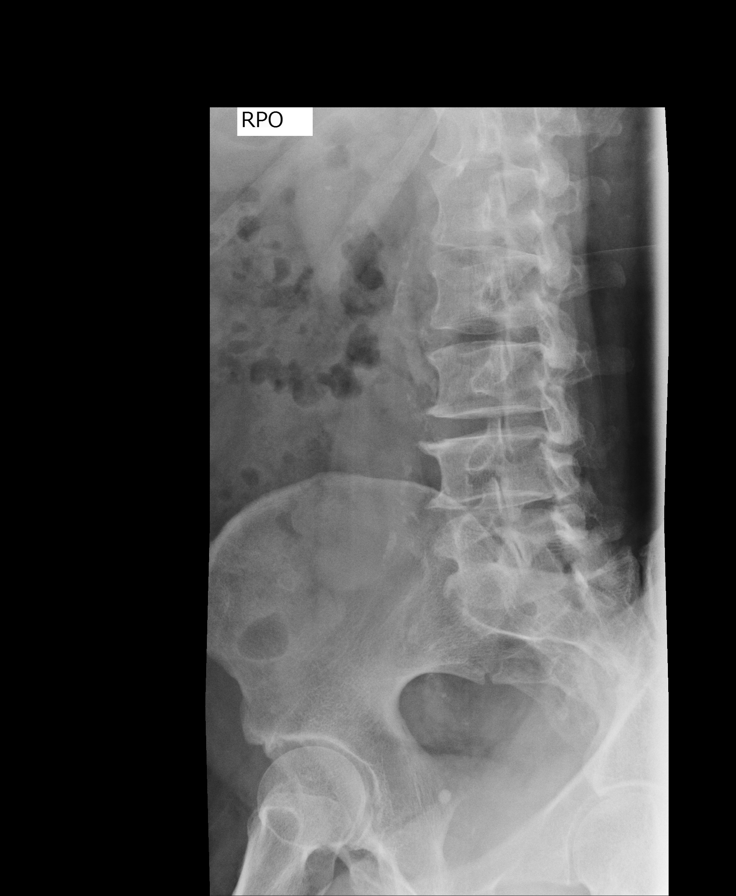

[7 of 7 positions shown; findings below may reference images not displayed]

FINDINGS: Seven views of lumbar spine submitted including flexion-extension
views. There is no acute fracture or subluxation. Mild anterior
spurring upper endplate of L2, L3 and L4 vertebral body. There is
smooth moderate disc space flattening at L5-S1 level.
Flexion-extension views shows no evidence of lumbar instability.
Mild anterior spurring at L3-L4 level. Facet degenerative changes L4
and L5 level.
IMPRESSION: No acute fracture or subluxation. Moderate disc space flattening at
L5-S1 level. Multilevel mild anterior spurring. Facet degenerative
changes at L4-L5 level. No evidence of lumbar instability on
flexion-extension views.

## 2016-06-03 ENCOUNTER — Encounter: Payer: Self-pay | Admitting: Family Medicine

## 2016-06-03 ENCOUNTER — Ambulatory Visit (INDEPENDENT_AMBULATORY_CARE_PROVIDER_SITE_OTHER): Payer: Medicare Other | Admitting: Family Medicine

## 2016-06-03 VITALS — BP 151/88 | HR 91 | Temp 99.0°F | Wt 164.0 lb

## 2016-06-03 DIAGNOSIS — J069 Acute upper respiratory infection, unspecified: Secondary | ICD-10-CM

## 2016-06-03 MED ORDER — AZITHROMYCIN 250 MG PO TABS
ORAL_TABLET | ORAL | 0 refills | Status: DC
Start: 2016-06-03 — End: 2016-08-21

## 2016-06-03 MED ORDER — BENZONATATE 100 MG PO CAPS: 200.0000 mg | ORAL_CAPSULE | Freq: Three times a day (TID) | ORAL | 0 refills | Status: DC | PRN

## 2016-06-03 MED ORDER — HYDROCOD POLST-CPM POLST ER 10-8 MG/5ML PO SUER: 5.0000 mL | Freq: Two times a day (BID) | ORAL | 0 refills | Status: DC | PRN

## 2016-06-03 NOTE — Progress Notes (Signed)
BP (!) 151/88   Pulse 91   Temp 99 F (37.2 C)   Wt 164 lb (74.4 kg)   SpO2 97%   BMI 26.47 kg/m    Subjective:    Patient ID: Nathan Fields, male    DOB: 02-03-1951, 65 y.o.   MRN: [id number]  HPI: Nathan Fields is a 65 y.o. male  Chief Complaint  Patient presents with  . URI    x 4 days; head and chest congestion, coughing up blood, runny nose, sore throat, fever.   Patient presents with 6 day history of congestion, productive cough of bloody sputum, sore throat, fever/chills/sweats. Taking ibuprofen and vitamins with minimal relief. Denies CP, SOB, wheezing. Several sick contacts.   Past Medical History:  Diagnosis Date  . Arthritis    right foot  . CAD (coronary artery disease)   . Chronic low back pain   . Diabetes mellitus without complication ([place])   . GERD (gastroesophageal reflux disease)   . Hyperlipidemia   . Hypertension   . Impaired fasting glucose   . Neuromuscular disorder ([place])   . Plantar fasciitis   . Syncope    Social History   Social History  . Marital status: Married    Spouse name: N/A  . Number of children: 2  . Years of education: 9th grade   Occupational History  . Not on file.   Social History Main Topics  . Smoking status: Never Smoker  . Smokeless tobacco: Never Used  . Alcohol use 0.0 oz/week     Comment: on rare occasion  . Drug use: No  . Sexual activity: Not Currently   Other Topics Concern  . Not on file   Social History Narrative  . No narrative on file    Relevant past medical, surgical, family and social history reviewed and updated as indicated. Interim medical history since our last visit reviewed. Allergies and medications reviewed and updated.  Review of Systems  Constitutional: Positive for chills, diaphoresis, fatigue and fever.  HENT: Positive for congestion, rhinorrhea and sore throat.   Eyes: Negative.   Respiratory: Positive for cough.   Cardiovascular: Negative.   Gastrointestinal: Negative.    Genitourinary: Negative.   Musculoskeletal: Negative.   Neurological: Negative.   Psychiatric/Behavioral: Negative.     Per HPI unless specifically indicated above     Objective:    BP (!) 151/88   Pulse 91   Temp 99 F (37.2 C)   Wt 164 lb (74.4 kg)   SpO2 97%   BMI 26.47 kg/m   Wt Readings from Last 3 Encounters:  06/03/16 164 lb (74.4 kg)  03/07/16 160 lb (72.6 kg)  02/05/16 160 lb (72.6 kg)    Physical Exam  Constitutional: He is oriented to person, place, and time. He appears well-developed and well-nourished.  HENT:  Head: Atraumatic.  Mouth/Throat: No oropharyngeal exudate.  Discharge present in b/l nares Oropharynx erythematous  Eyes: Conjunctivae are normal. Pupils are equal, round, and reactive to light. No scleral icterus.  Neck: Normal range of motion. Neck supple.  Cardiovascular: Normal rate and normal heart sounds.   Pulmonary/Chest: Effort normal. No respiratory distress.  Musculoskeletal: Normal range of motion.  Lymphadenopathy:    He has no cervical adenopathy.  Neurological: He is alert and oriented to person, place, and time.  Skin: Skin is warm and dry.  Psychiatric: He has a normal mood and affect. His behavior is normal.  Nursing note and vitals reviewed.  Assessment & Plan:   Problem List Items Addressed This Visit    None    Visit Diagnoses    Upper respiratory tract infection, unspecified type    -  Primary   Will treat with z-pak, tessalon, and tussionex. Cautions given with medication. Follow up if no improvement   Relevant Medications   azithromycin (ZITHROMAX) 250 MG tablet       Follow up plan: No Follow-up on file.

## 2016-06-03 NOTE — Patient Instructions (Signed)
Follow up as needed

## 2016-06-10 ENCOUNTER — Encounter: Payer: Self-pay | Admitting: Family Medicine

## 2016-06-10 ENCOUNTER — Other Ambulatory Visit: Payer: Self-pay | Admitting: Family Medicine

## 2016-06-10 MED ORDER — AMOXICILLIN-POT CLAVULANATE 875-125 MG PO TABS: 1.0000 | ORAL_TABLET | Freq: Two times a day (BID) | ORAL | 0 refills | Status: DC

## 2016-06-10 MED ORDER — LIDOCAINE VISCOUS 2 % MT SOLN: 5.0000 mL | OROMUCOSAL | 0 refills | Status: DC | PRN

## 2016-06-10 NOTE — Telephone Encounter (Signed)
Routing to provider  

## 2016-08-21 ENCOUNTER — Encounter: Payer: Self-pay | Admitting: Family Medicine

## 2016-08-21 ENCOUNTER — Ambulatory Visit (INDEPENDENT_AMBULATORY_CARE_PROVIDER_SITE_OTHER): Payer: Medicare Other | Admitting: Family Medicine

## 2016-08-21 VITALS — BP 146/94 | HR 79 | Temp 98.7°F | Resp 17 | Ht 66.0 in | Wt 167.0 lb

## 2016-08-21 DIAGNOSIS — J111 Influenza due to unidentified influenza virus with other respiratory manifestations: Secondary | ICD-10-CM | POA: Diagnosis not present

## 2016-08-21 DIAGNOSIS — R05 Cough: Secondary | ICD-10-CM | POA: Diagnosis not present

## 2016-08-21 LAB — VERITOR FLU A/B WAIVED
INFLUENZA A: NEGATIVE
Influenza B: NEGATIVE

## 2016-08-21 MED ORDER — HYDROCOD POLST-CPM POLST ER 10-8 MG/5ML PO SUER: 5.0000 mL | Freq: Two times a day (BID) | ORAL | 0 refills | Status: DC | PRN

## 2016-08-21 MED ORDER — AZITHROMYCIN 250 MG PO TABS: ORAL_TABLET | ORAL | 0 refills | Status: DC

## 2016-08-21 NOTE — Progress Notes (Signed)
BP (!) 146/94 (Cuff Size: Normal)   Pulse 79   Temp 98.7 F (37.1 C) (Oral)   Resp 17   Ht 5\' 6"  (1.676 m)   Wt 167 lb (75.8 kg)   SpO2 95%   BMI 26.95 kg/m    Subjective:    Patient ID: Nathan Fields, male    DOB: 06-09-1951, 66 y.o.   MRN: [id number]  HPI: Nathan Fields is a 67 y.o. male  Chief Complaint  Patient presents with  . Cough    onset 4 days  . Fever   Productive cough, congestion, sneezing, chest tightness, body aches, fatigue, SOB, wheezing. Taking mucinex cold and sinus, last dose was last night. No sick contacts. Hx of secondary pneumonia with flu infections in recent years.   Past Medical History:  Diagnosis Date  . Arthritis    right foot  . CAD (coronary artery disease)   . Chronic low back pain   . Diabetes mellitus without complication ([place])   . GERD (gastroesophageal reflux disease)   . Hyperlipidemia   . Hypertension   . Impaired fasting glucose   . Neuromuscular disorder ([place])   . Plantar fasciitis   . Syncope    Social History   Social History  . Marital status: Married    Spouse name: N/A  . Number of children: 2  . Years of education: 9th grade   Occupational History  . Not on file.   Social History Main Topics  . Smoking status: Never Smoker  . Smokeless tobacco: Never Used  . Alcohol use 0.0 oz/week     Comment: on rare occasion  . Drug use: No  . Sexual activity: Not Currently   Other Topics Concern  . Not on file   Social History Narrative  . No narrative on file    Relevant past medical, surgical, family and social history reviewed and updated as indicated. Interim medical history since our last visit reviewed. Allergies and medications reviewed and updated.  Review of Systems  Constitutional: Positive for chills, diaphoresis, fatigue and fever.  HENT: Positive for congestion and sneezing.   Respiratory: Positive for cough, chest tightness, shortness of breath and wheezing.   Cardiovascular: Negative.     Genitourinary: Negative.   Musculoskeletal: Positive for myalgias.  Neurological: Negative.   Psychiatric/Behavioral: Negative.     Per HPI unless specifically indicated above     Objective:    BP (!) 146/94 (Cuff Size: Normal)   Pulse 79   Temp 98.7 F (37.1 C) (Oral)   Resp 17   Ht 5\' 6"  (1.676 m)   Wt 167 lb (75.8 kg)   SpO2 95%   BMI 26.95 kg/m   Wt Readings from Last 3 Encounters:  08/21/16 167 lb (75.8 kg)  06/03/16 164 lb (74.4 kg)  03/07/16 160 lb (72.6 kg)    Physical Exam  Constitutional: He is oriented to person, place, and time. He appears well-developed and well-nourished. No distress.  HENT:  Head: Atraumatic.  Right Ear: External ear normal.  Left Ear: External ear normal.  Nose: Nose normal.  Oropharynx erythematous and edematous without exudates  Eyes: Conjunctivae are normal. Pupils are equal, round, and reactive to light.  Neck: Normal range of motion. Neck supple.  Cardiovascular: Normal rate and normal heart sounds.   Pulmonary/Chest: Effort normal. No respiratory distress. He has rales (possible mild rales left lower lung).  Musculoskeletal: Normal range of motion.  Neurological: He is alert and oriented to person,  place, and time.  Skin: Skin is warm and dry.  Psychiatric: He has a normal mood and affect. His behavior is normal.  Nursing note and vitals reviewed.     Assessment & Plan:   Problem List Items Addressed This Visit    None    Visit Diagnoses    Influenza    -  Primary   Rapid flu neg, but classic sxs. Given decrease in pulse ox, SOB, and hx of pneumonia, will go ahead and start zpak and tussionex.    Relevant Medications   azithromycin (ZITHROMAX) 250 MG tablet   Other Relevant Orders   Influenza A & B (STAT) (Completed)       Follow up plan: Return if symptoms worsen or fail to improve.

## 2016-08-21 NOTE — Patient Instructions (Signed)
Follow up as needed

## 2016-08-23 ENCOUNTER — Encounter: Payer: Self-pay | Admitting: Family Medicine

## 2016-08-23 ENCOUNTER — Other Ambulatory Visit: Payer: Self-pay | Admitting: Family Medicine

## 2016-08-23 MED ORDER — DOXYCYCLINE HYCLATE 100 MG PO TABS: 100.0000 mg | ORAL_TABLET | Freq: Two times a day (BID) | ORAL | 0 refills | Status: DC

## 2016-09-05 ENCOUNTER — Ambulatory Visit (INDEPENDENT_AMBULATORY_CARE_PROVIDER_SITE_OTHER): Payer: Medicare Other | Admitting: Family Medicine

## 2016-09-05 ENCOUNTER — Encounter: Payer: Self-pay | Admitting: Family Medicine

## 2016-09-05 VITALS — BP 166/74 | HR 86 | Temp 98.0°F | Resp 17 | Ht 66.0 in | Wt 165.0 lb

## 2016-09-05 DIAGNOSIS — E538 Deficiency of other specified B group vitamins: Secondary | ICD-10-CM | POA: Diagnosis not present

## 2016-09-05 DIAGNOSIS — Z125 Encounter for screening for malignant neoplasm of prostate: Secondary | ICD-10-CM

## 2016-09-05 DIAGNOSIS — G252 Other specified forms of tremor: Secondary | ICD-10-CM

## 2016-09-05 DIAGNOSIS — I2510 Atherosclerotic heart disease of native coronary artery without angina pectoris: Secondary | ICD-10-CM

## 2016-09-05 DIAGNOSIS — R7301 Impaired fasting glucose: Secondary | ICD-10-CM

## 2016-09-05 DIAGNOSIS — F332 Major depressive disorder, recurrent severe without psychotic features: Secondary | ICD-10-CM | POA: Diagnosis not present

## 2016-09-05 DIAGNOSIS — I10 Essential (primary) hypertension: Secondary | ICD-10-CM | POA: Diagnosis not present

## 2016-09-05 DIAGNOSIS — R0602 Shortness of breath: Secondary | ICD-10-CM

## 2016-09-05 DIAGNOSIS — F419 Anxiety disorder, unspecified: Secondary | ICD-10-CM

## 2016-09-05 DIAGNOSIS — E782 Mixed hyperlipidemia: Secondary | ICD-10-CM

## 2016-09-05 LAB — MICROSCOPIC EXAMINATION
BACTERIA UA: NONE SEEN
Epithelial Cells (non renal): NONE SEEN /hpf (ref 0–10)
RBC, UA: NONE SEEN /hpf (ref 0–?)
WBC UA: NONE SEEN /HPF (ref 0–?)

## 2016-09-05 LAB — UA/M W/RFLX CULTURE, ROUTINE
Bilirubin, UA: NEGATIVE
Glucose, UA: NEGATIVE
KETONES UA: NEGATIVE
LEUKOCYTES UA: NEGATIVE
Nitrite, UA: NEGATIVE
PH UA: 7 (ref 5.0–7.5)
PROTEIN UA: NEGATIVE
RBC UA: NEGATIVE
SPEC GRAV UA: 1.025 (ref 1.005–1.030)
UUROB: 0.2 mg/dL (ref 0.2–1.0)

## 2016-09-05 LAB — MICROALBUMIN, URINE WAIVED
CREATININE, URINE WAIVED: 200 mg/dL (ref 10–300)
MICROALB, UR WAIVED: 30 mg/L — AB (ref 0–19)
Microalb/Creat Ratio: <30 mg/g (ref <30)

## 2016-09-05 LAB — BAYER DCA HB A1C WAIVED: HB A1C (BAYER DCA - WAIVED): 6.4 % (ref <7.0)

## 2016-09-05 MED ORDER — SERTRALINE HCL 50 MG PO TABS: ORAL_TABLET | ORAL | 3 refills | Status: DC

## 2016-09-05 MED ORDER — QUETIAPINE FUMARATE 25 MG PO TABS: 25.0000 mg | ORAL_TABLET | Freq: Every day | ORAL | 3 refills | Status: DC

## 2016-09-05 NOTE — Assessment & Plan Note (Signed)
Rechecking levels today. Await results.  

## 2016-09-05 NOTE — Progress Notes (Signed)
BP (!) 166/74   Pulse 86   Temp 98 F (36.7 C) (Oral)   Resp 17   Ht 5\' 6"  (1.676 m)   Wt 165 lb (74.8 kg)   SpO2 99%   BMI 26.63 kg/m    Subjective:    Patient ID: Nathan Fields, male    DOB: May 19, 1951, 66 y.o.   MRN: [id number]  HPI: Nathan Fields is a 66 y.o. male  Chief Complaint  Patient presents with  . Tremors  . Shortness of Breath  . Anxiety   Has been shaking a lot for about the past year. Has been significantly worse since around the beginning of the year. Doesn't happen when he is reaching for something. He notes that it feels like he is going to drop things when he tries to grasp things. He has not had any numbness or tingling. He feels like his hands are "not right".   ANXIETY/STRESS Duration: Right before Christmas Anxious mood: yes  Excessive worrying: yes Irritability: yes  Sweating: yes Nausea: yes Palpitations:yes Hyperventilation: no Panic attacks: yes Agoraphobia: no  Obscessions/compulsions: yes Depressed mood: yes Depression screen [facility] 2/9 09/05/2016 02/05/2016 01/08/2016 02/15/2015  Decreased Interest 0 3 3 0  Down, Depressed, Hopeless 1 0 3 0  PHQ - 2 Score 1 3 6  0  Altered sleeping - 0 0 -  Tired, decreased energy - 3 3 -  Change in appetite - 0 0 -  Feeling bad or failure about yourself  - 1 1 -  Trouble concentrating - 0 3 -  Moving slowly or fidgety/restless - 0 2 -  Suicidal thoughts - 0 0 -  PHQ-9 Score - 7 15 -  Difficult doing work/chores - - Very difficult -   GAD 7 : Generalized Anxiety Score 09/05/2016  Nervous, Anxious, on Edge 1  Control/stop worrying 3  Worry too much - different things 3  Trouble relaxing 3  Restless 3  Easily annoyed or irritable 3  Afraid - awful might happen 2  Total GAD 7 Score 18  Anxiety Difficulty Somewhat difficult   Anhedonia: no Weight changes: no Insomnia: yes hard to fall asleep- will sleep, but only about an hour at a time,   Hypersomnia: no Fatigue/loss of energy: yes Feelings  of worthlessness: yes Feelings of guilt: yes Impaired concentration/indecisiveness: yes Suicidal ideations: yes- passive only, no plans Crying spells: yes  HYPERTENSION Hypertension status: uncontrolled  Satisfied with current treatment? no Duration of hypertension: chronic BP monitoring frequency:  not checking BP medication side effects:  no Medication compliance: good compliance Previous BP meds: lisinopril Aspirin: no Recurrent headaches: no Visual changes: yes Palpitations: yes Dyspnea: yes Chest pain: no Lower extremity edema: no Dizzy/lightheaded: no  SHORTNESS OF BREATH Duration: 2-3 months Onset: sudden Description of breathing discomfort: short winded Severity: moderate Episode duration: constant- better with sitting 30-60 min Frequency: with walking Related to exertion: yes Cough: yes productive Chest tightness: yes Wheezing: no Fevers: no Chest pain: yes Palpitations: yes  Nausea: yes Diaphoresis: no Deconditioning: yes Status: worse Treatments attempted: abx   Relevant past medical, surgical, family and social history reviewed and updated as indicated. Interim medical history since our last visit reviewed. Allergies and medications reviewed and updated.  Review of Systems  Constitutional: Negative.   Respiratory: Positive for cough, chest tightness, shortness of breath and wheezing. Negative for apnea, choking and stridor.   Cardiovascular: Positive for palpitations. Negative for chest pain and leg swelling.  Musculoskeletal: Positive for arthralgias,  back pain, gait problem and myalgias. Negative for joint swelling, neck pain and neck stiffness.  Neurological: Positive for tremors. Negative for dizziness, seizures, syncope, facial asymmetry, speech difficulty, weakness, light-headedness, numbness and headaches.  Psychiatric/Behavioral: Positive for agitation, behavioral problems, confusion, decreased concentration, dysphoric mood, sleep disturbance  and suicidal ideas. Negative for hallucinations and self-injury. The patient is nervous/anxious and is hyperactive.     Per HPI unless specifically indicated above     Objective:    BP (!) 166/74   Pulse 86   Temp 98 F (36.7 C) (Oral)   Resp 17   Ht 5\' 6"  (1.676 m)   Wt 165 lb (74.8 kg)   SpO2 99%   BMI 26.63 kg/m   Wt Readings from Last 3 Encounters:  09/05/16 165 lb (74.8 kg)  08/21/16 167 lb (75.8 kg)  06/03/16 164 lb (74.4 kg)    Physical Exam  Constitutional: He is oriented to person, place, and time. He appears well-developed and well-nourished. No distress.  HENT:  Head: Normocephalic and atraumatic.  Right Ear: Hearing normal.  Left Ear: Hearing normal.  Nose: Nose normal.  Eyes: Conjunctivae and lids are normal. Right eye exhibits no discharge. Left eye exhibits no discharge. No scleral icterus.  Cardiovascular: Normal rate, regular rhythm, normal heart sounds and intact distal pulses.  Exam reveals no gallop and no friction rub.   No murmur heard. Pulmonary/Chest: Effort normal and breath sounds normal. No respiratory distress. He has no wheezes. He has no rales. He exhibits no tenderness.  Musculoskeletal: Normal range of motion.  Neurological: He is alert and oriented to person, place, and time. He has normal reflexes. He displays normal reflexes. No cranial nerve deficit. He exhibits normal muscle tone. Coordination normal.  Fine tremor with extension, resolves with intentional movement.   Skin: Skin is warm and intact. No rash noted. He is diaphoretic. No erythema. No pallor.  Psychiatric: He has a normal mood and affect. His speech is normal and behavior is normal. Judgment and thought content normal. Cognition and memory are normal.    Results for orders placed or performed in visit on 08/21/16  Influenza A & B (STAT)  Result Value Ref Range   Influenza A Negative Negative   Influenza B Negative Negative      Assessment & Plan:   Problem List Items  Addressed This Visit      Cardiovascular and Mediastinum   CAD (coronary artery disease)    No chest pain. Asymptomatic. Continue to monitor.       Relevant Orders   CBC with Differential/Platelet   Comprehensive metabolic panel   UA/M w/rflx Culture, Routine   Hypertension - Primary    Not under good control. Likely due to anxiety. Will keep dose stable and recheck in 2 weeks.       Relevant Orders   CBC with Differential/Platelet   Comprehensive metabolic panel   Microalbumin, Urine Waived   UA/M w/rflx Culture, Routine     Endocrine   Impaired fasting glucose    Rechecking levels today.      Relevant Orders   Bayer DCA Hb A1c Waived   CBC with Differential/Platelet   Comprehensive metabolic panel   UA/M w/rflx Culture, Routine     Other   Hyperlipidemia    Statin intolerant. Rechecking levels today. Await results.       Relevant Orders   CBC with Differential/Platelet   Comprehensive metabolic panel   Lipid Panel w/o Chol/HDL Ratio   UA/M  w/rflx Culture, Routine   Depression    In acute exacerbation for unclear reason. Will obtain labs. Will start him on zoloft and seroquel for obtrusive thoughts. Recheck 2 weeks.       Relevant Medications   sertraline (ZOLOFT) 50 MG tablet   Other Relevant Orders   CBC with Differential/Platelet   Comprehensive metabolic panel   UA/M w/rflx Culture, Routine   VITAMIN D 25 Hydroxy (Vit-D Deficiency, Fractures)   Acute anxiety    In acute exacerbation for unclear reason. Will obtain labs. Will start him on zoloft and seroquel for obtrusive thoughts. Recheck 2 weeks.       Relevant Medications   sertraline (ZOLOFT) 50 MG tablet   Other Relevant Orders   Bayer DCA Hb A1c Waived   CBC with Differential/Platelet   Comprehensive metabolic panel   Lipid Panel w/o Chol/HDL Ratio   Microalbumin, Urine Waived   PSA   TSH   UA/M w/rflx Culture, Routine   VITAMIN D 25 Hydroxy (Vit-D Deficiency, Fractures)   B12 and Folate  Panel   B12 deficiency    Rechecking levels today. Await results.       Relevant Orders   UA/M w/rflx Culture, Routine   B12 and Folate Panel    Other Visit Diagnoses    Coarse tremors       Of unclear etiology. Will check labs. Will get anxiety under control. If not better, will send to neurology.   Relevant Orders   Bayer DCA Hb A1c Waived   CBC with Differential/Platelet   Comprehensive metabolic panel   Lipid Panel w/o Chol/HDL Ratio   Microalbumin, Urine Waived   PSA   TSH   UA/M w/rflx Culture, Routine   VITAMIN D 25 Hydroxy (Vit-D Deficiency, Fractures)   B12 and Folate Panel   Screening for prostate cancer       Checking levels today. Await results.    Relevant Orders   PSA   SOB (shortness of breath)       Lungs clear. Will obtain x-ray. Await results.    Relevant Orders   DG Chest 2 View       Follow up plan: Return in about 2 weeks (around 09/19/2016) for Mood recheck with physical.

## 2016-09-05 NOTE — Assessment & Plan Note (Signed)
Statin intolerant. Rechecking levels today. Await results.

## 2016-09-05 NOTE — Assessment & Plan Note (Signed)
In acute exacerbation for unclear reason. Will obtain labs. Will start him on zoloft and seroquel for obtrusive thoughts. Recheck 2 weeks.

## 2016-09-05 NOTE — Progress Notes (Deleted)
Resp 17   Ht 5\' 6"  (1.676 m)   Wt 165 lb (74.8 kg)   BMI 26.63 kg/m    Subjective:    Patient ID: Nathan Fields, male    DOB: 02/26/1951, 66 y.o.   MRN: [id number]  HPI: Nathan Fields is a 66 y.o. male presenting on 09/05/2016 for comprehensive medical examination. Current medical complaints include:{Blank single:19197::"none","***"}  He currently lives with: Interim Problems from his last visit: {Blank single:19197::"yes","no"}  Functional Status Survey: Is the patient deaf or have difficulty hearing?: Yes Does the patient have difficulty seeing, even when wearing glasses/contacts?: Yes Does the patient have difficulty concentrating, remembering, or making decisions?: Yes Does the patient have difficulty walking or climbing stairs?: No Does the patient have difficulty dressing or bathing?: No Does the patient have difficulty doing errands alone such as visiting a doctor's office or shopping?: Yes  FALL RISK: Fall Risk  09/05/2016 02/15/2015  Falls in the past year? No No    Depression Screen Depression screen [facility] 2/9 09/05/2016 02/05/2016 01/08/2016 02/15/2015  Decreased Interest 0 3 3 0  Down, Depressed, Hopeless 1 0 3 0  PHQ - 2 Score 1 3 6  0  Altered sleeping - 0 0 -  Tired, decreased energy - 3 3 -  Change in appetite - 0 0 -  Feeling bad or failure about yourself  - 1 1 -  Trouble concentrating - 0 3 -  Moving slowly or fidgety/restless - 0 2 -  Suicidal thoughts - 0 0 -  PHQ-9 Score - 7 15 -  Difficult doing work/chores - - Very difficult -    Advanced Directives <no information>  Past Medical History:  Past Medical History:  Diagnosis Date  . Arthritis    right foot  . CAD (coronary artery disease)   . Chronic low back pain   . Diabetes mellitus without complication ([place])   . GERD (gastroesophageal reflux disease)   . Hyperlipidemia   . Hypertension   . Impaired fasting glucose   . Neuromuscular disorder ([place])   . Plantar fasciitis   . Syncope      Surgical History:  Past Surgical History:  Procedure Laterality Date  . CARDIAC CATHETERIZATION    . COLONOSCOPY  2011   Polyp removed, follow up in 5 years    Medications:  Current Outpatient Prescriptions on File Prior to Visit  Medication Sig  . aspirin 81 MG tablet Take 81 mg by mouth daily.  Marland Kitchen azithromycin (ZITHROMAX) 250 MG tablet Take 2 tablets day one, then 1 tablet daily  . chlorpheniramine-HYDROcodone (TUSSIONEX PENNKINETIC ER) 10-8 MG/5ML SUER Take 5 mLs by mouth every 12 (twelve) hours as needed for cough.  . doxycycline (VIBRA-TABS) 100 MG tablet Take 1 tablet (100 mg total) by mouth 2 (two) times daily.  Marland Kitchen lisinopril (PRINIVIL,ZESTRIL) 20 MG tablet Take 1.5 tablets (30 mg total) by mouth daily.  . niacin (NIASPAN) 500 MG CR tablet 1 daily at bed time for 4 weeks, then 2tabs QHS for 4 weeks, then 3tabs QHS for 4 weeks, then 4tabs QHS for 4 weeks, stay on 4 QHS   No current facility-administered medications on file prior to visit.     Allergies:  Allergies  Allergen Reactions  . Statins Other (See Comments)    fatigue  . Zetia [Ezetimibe] Other (See Comments)    Constipation    Social History:  Social History   Social History  . Marital status: Married    Spouse name:  N/A  . Number of children: 2  . Years of education: 9th grade   Occupational History  . Not on file.   Social History Main Topics  . Smoking status: Never Smoker  . Smokeless tobacco: Never Used  . Alcohol use 0.0 oz/week     Comment: on rare occasion  . Drug use: No  . Sexual activity: Not Currently   Other Topics Concern  . Not on file   Social History Narrative  . No narrative on file   History  Smoking Status  . Never Smoker  Smokeless Tobacco  . Never Used   History  Alcohol Use  . 0.0 oz/week    Comment: on rare occasion    Family History:  Family History  Problem Relation Age of Onset  . Heart disease Mother   . Hyperlipidemia Mother   . Diabetes Father    . Heart attack Father   . Hyperlipidemia Father     Past medical history, surgical history, medications, allergies, family history and social history reviewed with patient today and changes made to appropriate areas of the chart.   ROS  All other ROS negative except what is listed above and in the HPI.      Objective:    Resp 17   Ht 5\' 6"  (1.676 m)   Wt 165 lb (74.8 kg)   BMI 26.63 kg/m   Wt Readings from Last 3 Encounters:  09/05/16 165 lb (74.8 kg)  08/21/16 167 lb (75.8 kg)  06/03/16 164 lb (74.4 kg)    Physical Exam  Cognitive Testing - 6-CIT  Correct? Score   What year is it? {YES NO:22349} {Numbers; 0-4:31231} Yes = 0    No = 4  What month is it? {YES NO:22349} {Numbers; 0-4:31231} Yes = 0    No = 3  Remember:     Pia Mau, [place], [place]     What time is it? {YES NO:22349} {Numbers; 0-4:31231} Yes = 0    No = 3  Count backwards from 20 to 1 {YES NO:22349} {Numbers; 0-4:31231} Correct = 0    1 error = 2   More than 1 error = 4  Say the months of the year in reverse. {YES NO:22349} {Numbers; 0-4:31231} Correct = 0    1 error = 2   More than 1 error = 4  What address did I ask you to remember? {YES NO:22349} {NUMBERS; 0-10:5044} Correct = 0  1 error = 2    2 error = 4    3 error = 6    4 error = 8    All wrong = 10       TOTAL SCORE  {Numbers; 8-54:62703}/50   Interpretation:  {Desc; normal/abnormal:11317::"Normal"}  Normal (0-7) Abnormal (8-28)    Results for orders placed or performed in visit on 08/21/16  Influenza A & B (STAT)  Result Value Ref Range   Influenza A Negative Negative   Influenza B Negative Negative      Assessment & Plan:   Problem List Items Addressed This Visit    None       Preventative Services:  Health Risk Assessment and Personalized Prevention Plan: Bone Mass Measurements: CVD Screening:  Colon Cancer Screening:  Depression Screening:  Diabetes Screening:  Glaucoma Screening:  Hepatitis B vaccine: Hepatitis C  screening:  HIV Screening: Flu Vaccine: Lung cancer Screening: Obesity Screening:  Pneumonia Vaccines (2): STI Screening: PSA screening:  Discussed aspirin prophylaxis for  myocardial infarction prevention and decision was {Blank single:19197::"it was not indicated","made to continue ASA","made to start ASA","made to stop ASA","that we recommended ASA, and patient refused"}  LABORATORY TESTING:  Health maintenance labs ordered today as discussed above.   The natural history of prostate cancer and ongoing controversy regarding screening and potential treatment outcomes of prostate cancer has been discussed with the patient. The meaning of a false positive PSA and a false negative PSA has been discussed. He indicates understanding of the limitations of this screening test and wishes *** to proceed with screening PSA testing.   IMMUNIZATIONS:   - Tdap: Tetanus vaccination status reviewed: {tetanus status:315746}. - Influenza: {Blank single:19197::"Up to date","Administered today","Postponed to flu season","Refused","Given elsewhere"} - Pneumovax: {Blank single:19197::"Up to date","Administered today","Not applicable","Refused","Given elsewhere"} - Prevnar: {Blank single:19197::"Up to date","Administered today","Not applicable","Refused","Given elsewhere"} - Zostavax vaccine: {Blank single:19197::"Up to date","Administered today","Not applicable","Refused","Given elsewhere"}  SCREENING: - Colonoscopy: {Blank single:19197::"Up to date","Ordered today","Not applicable","Refused","Done elsewhere"}  Discussed with patient purpose of the colonoscopy is to detect colon cancer at curable precancerous or early stages   - AAA Screening: {Blank single:19197::"Up to date","Ordered today","Not applicable","Refused","Done elsewhere"}  -Hearing Test: {Blank single:19197::"Up to date","Ordered today","Not applicable","Refused","Done elsewhere"}  -Spirometry: {Blank single:19197::"Up to date","Ordered  today","Not applicable","Refused","Done elsewhere"}   PATIENT COUNSELING:    Sexuality: Discussed sexually transmitted diseases, partner selection, use of condoms, avoidance of unintended pregnancy  and contraceptive alternatives.   Advised to avoid cigarette smoking.  I discussed with the patient that most people either abstain from alcohol or drink within safe limits (<=14/week and <=4 drinks/occasion for males, <=7/weeks and <= 3 drinks/occasion for females) and that the risk for alcohol disorders and other health effects rises proportionally with the number of drinks per week and how often a drinker exceeds daily limits.  Discussed cessation/primary prevention of drug use and availability of treatment for abuse.   Diet: Encouraged to adjust caloric intake to maintain  or achieve ideal body weight, to reduce intake of dietary saturated fat and total fat, to limit sodium intake by avoiding high sodium foods and not adding table salt, and to maintain adequate dietary potassium and calcium preferably from fresh fruits, vegetables, and low-fat dairy products.    stressed the importance of regular exercise  Injury prevention: Discussed safety belts, safety helmets, smoke detector, smoking near bedding or upholstery.   Dental health: Discussed importance of regular tooth brushing, flossing, and dental visits.   Follow up plan: NEXT PREVENTATIVE PHYSICAL DUE IN 1 YEAR. No Follow-up on file.

## 2016-09-05 NOTE — Assessment & Plan Note (Addendum)
Not under good control. Likely due to anxiety. Will keep dose stable and recheck in 2 weeks.

## 2016-09-05 NOTE — Assessment & Plan Note (Signed)
No chest pain. Asymptomatic. Continue to monitor.

## 2016-09-05 NOTE — Assessment & Plan Note (Signed)
Rechecking levels today. 

## 2016-09-06 LAB — CBC WITH DIFFERENTIAL/PLATELET
Basophils Absolute: 0.1 10*3/uL (ref 0.0–0.2)
Basos: 1 %
EOS (ABSOLUTE): 0.1 10*3/uL (ref 0.0–0.4)
Eos: 2 %
Hematocrit: 44.2 % (ref 37.5–51.0)
Hemoglobin: 14.7 g/dL (ref 13.0–17.7)
IMMATURE GRANS (ABS): 0 10*3/uL (ref 0.0–0.1)
Immature Granulocytes: 0 %
LYMPHS: 31 %
Lymphocytes Absolute: 2.4 10*3/uL (ref 0.7–3.1)
MCH: 29.9 pg (ref 26.6–33.0)
MCHC: 33.3 g/dL (ref 31.5–35.7)
MCV: 90 fL (ref 79–97)
MONOS ABS: 0.7 10*3/uL (ref 0.1–0.9)
Monocytes: 9 %
NEUTROS ABS: 4.5 10*3/uL (ref 1.4–7.0)
Neutrophils: 57 %
PLATELETS: 276 10*3/uL (ref 150–379)
RBC: 4.91 x10E6/uL (ref 4.14–5.80)
RDW: 14.5 % (ref 12.3–15.4)
WBC: 7.8 10*3/uL (ref 3.4–10.8)

## 2016-09-06 LAB — COMPREHENSIVE METABOLIC PANEL
ALT: 14 IU/L (ref 0–44)
AST: 16 IU/L (ref 0–40)
Albumin/Globulin Ratio: 1.9 (ref 1.2–2.2)
Albumin: 4.7 g/dL (ref 3.6–4.8)
Alkaline Phosphatase: 88 IU/L (ref 39–117)
BILIRUBIN TOTAL: 0.3 mg/dL (ref 0.0–1.2)
BUN / CREAT RATIO: 17 (ref 10–24)
BUN: 19 mg/dL (ref 8–27)
CHLORIDE: 101 mmol/L (ref 96–106)
CO2: 23 mmol/L (ref 18–29)
Calcium: 10.1 mg/dL (ref 8.6–10.2)
Creatinine, Ser: 1.1 mg/dL (ref 0.76–1.27)
GFR calc non Af Amer: 71 mL/min/{1.73_m2} (ref >59)
GFR, EST AFRICAN AMERICAN: 82 mL/min/{1.73_m2} (ref >59)
Globulin, Total: 2.5 g/dL (ref 1.5–4.5)
Glucose: 100 mg/dL — ABNORMAL HIGH (ref 65–99)
Potassium: 4.6 mmol/L (ref 3.5–5.2)
SODIUM: 141 mmol/L (ref 134–144)
TOTAL PROTEIN: 7.2 g/dL (ref 6.0–8.5)

## 2016-09-06 LAB — B12 AND FOLATE PANEL
FOLATE: 15.2 ng/mL (ref >3.0)
Vitamin B-12: 919 pg/mL (ref 232–1245)

## 2016-09-06 LAB — VITAMIN D 25 HYDROXY (VIT D DEFICIENCY, FRACTURES): Vit D, 25-Hydroxy: 25.5 ng/mL — ABNORMAL LOW (ref 30.0–100.0)

## 2016-09-06 LAB — LIPID PANEL W/O CHOL/HDL RATIO
CHOLESTEROL TOTAL: 326 mg/dL — AB (ref 100–199)
HDL: 48 mg/dL (ref >39)
LDL CALC: 236 mg/dL — AB (ref 0–99)
TRIGLYCERIDES: 212 mg/dL — AB (ref 0–149)
VLDL CHOLESTEROL CAL: 42 mg/dL — AB (ref 5–40)

## 2016-09-06 LAB — PSA: Prostate Specific Ag, Serum: 2.7 ng/mL (ref 0.0–4.0)

## 2016-09-06 LAB — TSH: TSH: 1.21 u[IU]/mL (ref 0.450–4.500)

## 2016-09-09 ENCOUNTER — Other Ambulatory Visit: Payer: Self-pay | Admitting: Family Medicine

## 2016-09-11 ENCOUNTER — Ambulatory Visit
Admission: RE | Admit: 2016-09-11 | Discharge: 2016-09-11 | Disposition: A | Discharge status: Home / Self Care | Payer: Medicare Other | Source: Ambulatory Visit | Attending: Family Medicine | Admitting: Family Medicine

## 2016-09-11 DIAGNOSIS — J189 Pneumonia, unspecified organism: Secondary | ICD-10-CM | POA: Diagnosis not present

## 2016-09-11 DIAGNOSIS — R0602 Shortness of breath: Secondary | ICD-10-CM

## 2016-09-12 ENCOUNTER — Telehealth: Payer: Self-pay

## 2016-09-12 NOTE — Telephone Encounter (Signed)
Please let him know that his x-ray was normal. He has a little scarring on his lungs, but nothing bad or that's making him sick right now.

## 2016-09-12 NOTE — Telephone Encounter (Signed)
Impression from X-ray states, "IMPRESSION: Mild right base pleuroparenchymal thickening consistent scarring. No acute cardiopulmonary disease identified."  Please advise.

## 2016-09-12 NOTE — Telephone Encounter (Signed)
Message relayed to patient. Verbalized understanding and denied questions.   

## 2016-09-12 NOTE — Telephone Encounter (Signed)
Patient called wanting to know the results of his X-Ray from yesterday.

## 2016-09-18 ENCOUNTER — Other Ambulatory Visit: Payer: Self-pay | Admitting: Family Medicine

## 2016-09-18 NOTE — Telephone Encounter (Signed)
Routing to provider  

## 2016-09-23 ENCOUNTER — Telehealth: Payer: Self-pay | Admitting: Family Medicine

## 2016-09-23 ENCOUNTER — Ambulatory Visit (INDEPENDENT_AMBULATORY_CARE_PROVIDER_SITE_OTHER): Payer: Medicare Other | Admitting: Family Medicine

## 2016-09-23 ENCOUNTER — Ambulatory Visit: Payer: Medicare Other | Admitting: Family Medicine

## 2016-09-23 ENCOUNTER — Encounter: Payer: Self-pay | Admitting: Family Medicine

## 2016-09-23 VITALS — BP 145/80 | HR 66 | Temp 97.6°F | Resp 17 | Ht 66.0 in | Wt 168.0 lb

## 2016-09-23 DIAGNOSIS — F419 Anxiety disorder, unspecified: Secondary | ICD-10-CM

## 2016-09-23 DIAGNOSIS — E782 Mixed hyperlipidemia: Secondary | ICD-10-CM | POA: Diagnosis not present

## 2016-09-23 DIAGNOSIS — I10 Essential (primary) hypertension: Secondary | ICD-10-CM

## 2016-09-23 LAB — LP+ALT+AST PICCOLO, WAIVED
ALT (SGPT) PICCOLO, WAIVED: 19 U/L (ref 10–47)
AST (SGOT) PICCOLO, WAIVED: 32 U/L (ref 11–38)
CHOLESTEROL PICCOLO, WAIVED: 280 mg/dL — AB (ref <200)
Chol/HDL Ratio Piccolo,Waive: 4.2 mg/dL
HDL CHOL PICCOLO, WAIVED: 67 mg/dL (ref >59)
LDL Chol Calc Piccolo Waived: 190 mg/dL — ABNORMAL HIGH (ref <100)
TRIGLYCERIDES PICCOLO,WAIVED: 117 mg/dL (ref <150)
VLDL CHOL CALC PICCOLO,WAIVE: 23 mg/dL (ref <30)

## 2016-09-23 MED ORDER — NIACIN ER (ANTIHYPERLIPIDEMIC) 1000 MG PO TBCR: 2000.0000 mg | EXTENDED_RELEASE_TABLET | Freq: Every day | ORAL | 1 refills | Status: DC

## 2016-09-23 MED ORDER — LISINOPRIL 30 MG PO TABS: 30.0000 mg | ORAL_TABLET | Freq: Every day | ORAL | 1 refills | Status: DC

## 2016-09-23 NOTE — Assessment & Plan Note (Signed)
Still elevated. Will increase his lisinopril to 30mg  as he has only been taking 20 and will recheck in 1 month.

## 2016-09-23 NOTE — Telephone Encounter (Signed)
Called wife to check on dose of lisinopril. He is only taking 20mg  daily

## 2016-09-23 NOTE — Progress Notes (Signed)
BP (!) 145/80   Pulse 66   Temp 97.6 F (36.4 C) (Oral)   Resp 17   Ht 5\' 6"  (1.676 m)   Wt 168 lb (76.2 kg)   SpO2 98%   BMI 27.12 kg/m    Subjective:    Patient ID: Nathan Fields, male    DOB: [birth year], 66 y.o.   MRN: [id number]  HPI: Nathan Fields is a 66 y.o. male  No chief complaint on file.  ANXIETY/STRESS- he states that his sertraline tore his stomach up, so he stopped taking it. He is not taking anything at bed time. He notes that he just needs a refill on his niacin. He notes that his mood is doing much much better Duration:better Anxious mood: no  Excessive worrying: no Irritability: no  Sweating: no Nausea: no Palpitations:no Hyperventilation: no Panic attacks: no Agoraphobia: no  Obscessions/compulsions: no Depressed mood: no Depression screen [facility] 2/9 09/23/2016 09/05/2016 02/05/2016 01/08/2016 02/15/2015  Decreased Interest 0 0 3 3 0  Down, Depressed, Hopeless 0 1 0 3 0  PHQ - 2 Score 0 1 3 6  0  Altered sleeping - - 0 0 -  Tired, decreased energy - - 3 3 -  Change in appetite - - 0 0 -  Feeling bad or failure about yourself  - - 1 1 -  Trouble concentrating - - 0 3 -  Moving slowly or fidgety/restless - - 0 2 -  Suicidal thoughts - - 0 0 -  PHQ-9 Score - - 7 15 -  Difficult doing work/chores - - - Very difficult -   GAD 7 : Generalized Anxiety Score 09/23/2016 09/05/2016  Nervous, Anxious, on Edge 0 1  Control/stop worrying 0 3  Worry too much - different things 0 3  Trouble relaxing 0 3  Restless 0 3  Easily annoyed or irritable 0 3  Afraid - awful might happen 0 2  Total GAD 7 Score 0 18  Anxiety Difficulty - Somewhat difficult   Anhedonia: no Weight changes: no Insomnia: yes   Hypersomnia: no Fatigue/loss of energy: no Feelings of worthlessness: no Feelings of guilt: no Impaired concentration/indecisiveness: no Suicidal ideations: no  Crying spells: no Recent Stressors/Life Changes: no  HYPERTENSION Hypertension status: stable    Satisfied with current treatment? yes Duration of hypertension: chronic BP monitoring frequency:  not checking BP medication side effects:  no Medication compliance: good compliance Previous BP meds: lisinopril Aspirin: yes Recurrent headaches: no Visual changes: no Palpitations: no Dyspnea: no Chest pain: no Lower extremity edema: no Dizzy/lightheaded: no  HYPERLIPIDEMIA Hyperlipidemia status: good compliance Satisfied with current treatment?  yes Side effects:  no Medication compliance: excellent compliance Past cholesterol meds: niacin Supplements: niacin Aspirin:  yes Chest pain:  no Coronary artery disease:  yes Family history CAD:  yes   Relevant past medical, surgical, family and social history reviewed and updated as indicated. Interim medical history since our last visit reviewed. Allergies and medications reviewed and updated.  Review of Systems  Constitutional: Negative.   Respiratory: Negative.   Cardiovascular: Negative.   Psychiatric/Behavioral: Negative.     Per HPI unless specifically indicated above     Objective:    BP (!) 145/80   Pulse 66   Temp 97.6 F (36.4 C) (Oral)   Resp 17   Ht 5\' 6"  (1.676 m)   Wt 168 lb (76.2 kg)   SpO2 98%   BMI 27.12 kg/m   Wt Readings from Last 3 Encounters:  09/23/16 168 lb (76.2 kg)  09/05/16 165 lb (74.8 kg)  08/21/16 167 lb (75.8 kg)    Physical Exam  Constitutional: He is oriented to person, place, and time. He appears well-developed and well-nourished. No distress.  HENT:  Head: Normocephalic and atraumatic.  Right Ear: Hearing and external ear normal.  Left Ear: Hearing and external ear normal.  Nose: Nose normal.  Mouth/Throat: Oropharynx is clear and moist. No oropharyngeal exudate.  Eyes: Conjunctivae and lids are normal. Right eye exhibits no discharge. Left eye exhibits no discharge. No scleral icterus.  Cardiovascular: Normal rate, regular rhythm, normal heart sounds and intact distal  pulses.  Exam reveals no gallop and no friction rub.   No murmur heard. Pulmonary/Chest: Effort normal and breath sounds normal. No respiratory distress. He has no wheezes. He has no rales. He exhibits no tenderness.  Musculoskeletal: Normal range of motion.  Neurological: He is alert and oriented to person, place, and time.  Skin: Skin is warm, dry and intact. No rash noted. He is not diaphoretic. No erythema. No pallor.  Psychiatric: He has a normal mood and affect. His speech is normal and behavior is normal. Judgment and thought content normal. Cognition and memory are normal.  Nursing note and vitals reviewed.   Results for orders placed or performed in visit on 09/05/16  Microscopic Examination  Result Value Ref Range   WBC, UA None seen 0 - 5 /hpf   RBC, UA None seen 0 - 2 /hpf   Epithelial Cells (non renal) None seen 0 - 10 /hpf   Bacteria, UA None seen None seen/Few  Bayer DCA Hb A1c Waived  Result Value Ref Range   Bayer DCA Hb A1c Waived 6.4 <7.0 %  CBC with Differential/Platelet  Result Value Ref Range   WBC 7.8 3.4 - 10.8 x10E3/uL   RBC 4.91 4.14 - 5.80 x10E6/uL   Hemoglobin 14.7 13.0 - 17.7 g/dL   Hematocrit 44.2 37.5 - 51.0 %   MCV 90 79 - 97 fL   MCH 29.9 26.6 - 33.0 pg   MCHC 33.3 31.5 - 35.7 g/dL   RDW 14.5 12.3 - 15.4 %   Platelets 276 150 - 379 x10E3/uL   Neutrophils 57 Not Estab. %   Lymphs 31 Not Estab. %   Monocytes 9 Not Estab. %   Eos 2 Not Estab. %   Basos 1 Not Estab. %   Neutrophils Absolute 4.5 1.4 - 7.0 x10E3/uL   Lymphocytes Absolute 2.4 0.7 - 3.1 x10E3/uL   Monocytes Absolute 0.7 0.1 - 0.9 x10E3/uL   EOS (ABSOLUTE) 0.1 0.0 - 0.4 x10E3/uL   Basophils Absolute 0.1 0.0 - 0.2 x10E3/uL   Immature Granulocytes 0 Not Estab. %   Immature Grans (Abs) 0.0 0.0 - 0.1 x10E3/uL  Comprehensive metabolic panel  Result Value Ref Range   Glucose 100 (H) 65 - 99 mg/dL   BUN 19 8 - 27 mg/dL   Creatinine, Ser 1.10 0.76 - 1.27 mg/dL   GFR calc non Af Amer 71  >59 mL/min/1.73   GFR calc Af Amer 82 >59 mL/min/1.73   BUN/Creatinine Ratio 17 10 - 24   Sodium 141 134 - 144 mmol/L   Potassium 4.6 3.5 - 5.2 mmol/L   Chloride 101 96 - 106 mmol/L   CO2 23 18 - 29 mmol/L   Calcium 10.1 8.6 - 10.2 mg/dL   Total Protein 7.2 6.0 - 8.5 g/dL   Albumin 4.7 3.6 - 4.8 g/dL   Globulin, Total  2.5 1.5 - 4.5 g/dL   Albumin/Globulin Ratio 1.9 1.2 - 2.2   Bilirubin Total 0.3 0.0 - 1.2 mg/dL   Alkaline Phosphatase 88 39 - 117 IU/L   AST 16 0 - 40 IU/L   ALT 14 0 - 44 IU/L  Lipid Panel w/o Chol/HDL Ratio  Result Value Ref Range   Cholesterol, Total 326 (H) 100 - 199 mg/dL   Triglycerides 212 (H) 0 - 149 mg/dL   HDL 48 >39 mg/dL   VLDL Cholesterol Cal 42 (H) 5 - 40 mg/dL   LDL Calculated 236 (H) 0 - 99 mg/dL   Comment: Comment   Microalbumin, Urine Waived  Result Value Ref Range   Microalb, Ur Waived 30 (H) 0 - 19 mg/L   Creatinine, Urine Waived 200 10 - 300 mg/dL   Microalb/Creat Ratio <30 <30 mg/g  PSA  Result Value Ref Range   Prostate Specific Ag, Serum 2.7 0.0 - 4.0 ng/mL  TSH  Result Value Ref Range   TSH 1.210 0.450 - 4.500 uIU/mL  UA/M w/rflx Culture, Routine  Result Value Ref Range   Specific Gravity, UA 1.025 1.005 - 1.030   pH, UA 7.0 5.0 - 7.5   Color, UA Yellow Yellow   Appearance Ur Clear Clear   Leukocytes, UA Negative Negative   Protein, UA Negative Negative/Trace   Glucose, UA Negative Negative   Ketones, UA Negative Negative   RBC, UA Negative Negative   Bilirubin, UA Negative Negative   Urobilinogen, Ur 0.2 0.2 - 1.0 mg/dL   Nitrite, UA Negative Negative   Microscopic Examination See below:   VITAMIN D 25 Hydroxy (Vit-D Deficiency, Fractures)  Result Value Ref Range   Vit D, 25-Hydroxy 25.5 (L) 30.0 - 100.0 ng/mL  B12 and Folate Panel  Result Value Ref Range   Vitamin B-12 919 232 - 1,245 pg/mL   Folate 15.2 >3.0 ng/mL      Assessment & Plan:   Problem List Items Addressed This Visit      Cardiovascular and  Mediastinum   Hypertension    Still elevated. Will increase his lisinopril to 30mg  as he has only been taking 20 and will recheck in 1 month.       Relevant Medications   lisinopril (PRINIVIL,ZESTRIL) 30 MG tablet   niacin (NIASPAN) 1000 MG CR tablet     Other   Hyperlipidemia - Primary    Rechecking levels today. Improved to 280 from 326-- still not at goal. Will increase niacin to 2000mg  qHS and recheck at follow up. Consider repatha.       Relevant Medications   lisinopril (PRINIVIL,ZESTRIL) 30 MG tablet   niacin (NIASPAN) 1000 MG CR tablet   Other Relevant Orders   LP+ALT+AST [place], Waived   Acute anxiety    Resolved. Feeling better. Off medicine.           Follow up plan: Return in about 4 weeks (around 10/21/2016) for BP follow up.

## 2016-09-23 NOTE — Assessment & Plan Note (Addendum)
Rechecking levels today. Improved to 280 from 326-- still not at goal. Will increase niacin to 2000mg  qHS and recheck at follow up. Consider repatha.

## 2016-09-23 NOTE — Assessment & Plan Note (Signed)
Resolved. Feeling better. Off medicine.

## 2016-10-08 ENCOUNTER — Ambulatory Visit (INDEPENDENT_AMBULATORY_CARE_PROVIDER_SITE_OTHER): Payer: Medicare Other | Admitting: Family Medicine

## 2016-10-08 ENCOUNTER — Encounter: Payer: Self-pay | Admitting: Family Medicine

## 2016-10-08 VITALS — BP 144/91 | HR 74 | Temp 97.5°F | Ht 65.0 in | Wt 168.7 lb

## 2016-10-08 DIAGNOSIS — Z0000 Encounter for general adult medical examination without abnormal findings: Secondary | ICD-10-CM

## 2016-10-08 DIAGNOSIS — I10 Essential (primary) hypertension: Secondary | ICD-10-CM | POA: Diagnosis not present

## 2016-10-08 DIAGNOSIS — E782 Mixed hyperlipidemia: Secondary | ICD-10-CM | POA: Diagnosis not present

## 2016-10-08 MED ORDER — SILDENAFIL CITRATE 100 MG PO TABS: 50.0000 mg | ORAL_TABLET | Freq: Every day | ORAL | 11 refills | Status: DC | PRN

## 2016-10-08 NOTE — Patient Instructions (Addendum)
Preventative Services:  Health Risk Assessment and Personalized Prevention Plan: Done today Bone Mass Measurements: N/A CVD Screening: Done today Colon Cancer Screening: Up to date Depression Screening: Done today Diabetes Screening: Done today Glaucoma Screening: See your eye doctor Hepatitis B vaccine: N/A Hepatitis C screening: up to date HIV Screening: up to date Flu Vaccine: Declined Lung cancer Screening: N/A Obesity Screening: Done today Pneumonia Vaccines (2): STI Screening: N/A PSA screening: up to date   Health Maintenance, Male A healthy lifestyle and preventive care is important for your health and wellness. Ask your health care provider about what schedule of regular examinations is right for you. What should I know about weight and diet?  Eat a Healthy Diet  Eat plenty of vegetables, fruits, whole grains, low-fat dairy products, and lean protein.  Do not eat a lot of foods high in solid fats, added sugars, or salt. Maintain a Healthy Weight  Regular exercise can help you achieve or maintain a healthy weight. You should:  Do at least 150 minutes of exercise each week. The exercise should increase your heart rate and make you sweat (moderate-intensity exercise).  Do strength-training exercises at least twice a week. Watch Your Levels of Cholesterol and Blood Lipids  Have your blood tested for lipids and cholesterol every 5 years starting at 65 years of age. If you are at high risk for heart disease, you should start having your blood tested when you are 65 years old. You may need to have your cholesterol levels checked more often if:  Your lipid or cholesterol levels are high.  You are older than 65 years of age.  You are at high risk for heart disease. What should I know about cancer screening? Many types of cancers can be detected early and may often be prevented. Lung Cancer  You should be screened every year for lung cancer if:  You are a current smoker  who has smoked for at least 30 years.  You are a former smoker who has quit within the past 15 years.  Talk to your health care provider about your screening options, when you should start screening, and how often you should be screened. Colorectal Cancer  Routine colorectal cancer screening usually begins at 65 years of age and should be repeated every 5-10 years until you are 65 years old. You may need to be screened more often if early forms of precancerous polyps or small growths are found. Your health care provider may recommend screening at an earlier age if you have risk factors for colon cancer.  Your health care provider may recommend using home test kits to check for hidden blood in the stool.  A small camera at the end of a tube can be used to examine your colon (sigmoidoscopy or colonoscopy). This checks for the earliest forms of colorectal cancer. Prostate and Testicular Cancer  Depending on your age and overall health, your health care provider may do certain tests to screen for prostate and testicular cancer.  Talk to your health care provider about any symptoms or concerns you have about testicular or prostate cancer. Skin Cancer  Check your skin from head to toe regularly.  Tell your health care provider about any new moles or changes in moles, especially if:  There is a change in a mole's size, shape, or color.  You have a mole that is larger than a pencil eraser.  Always use sunscreen. Apply sunscreen liberally and repeat throughout the day.  Protect yourself by  wearing long sleeves, pants, a wide-brimmed hat, and sunglasses when outside. What should I know about heart disease, diabetes, and high blood pressure?  If you are 34-44 years of age, have your blood pressure checked every 3-5 years. If you are 56 years of age or older, have your blood pressure checked every year. You should have your blood pressure measured twice-once when you are at a hospital or clinic,  and once when you are not at a hospital or clinic. Record the average of the two measurements. To check your blood pressure when you are not at a hospital or clinic, you can use:  An automated blood pressure machine at a pharmacy.  A home blood pressure monitor.  Talk to your health care provider about your target blood pressure.  If you are between 11-68 years old, ask your health care provider if you should take aspirin to prevent heart disease.  Have regular diabetes screenings by checking your fasting blood sugar level.  If you are at a normal weight and have a low risk for diabetes, have this test once every three years after the age of 34.  If you are overweight and have a high risk for diabetes, consider being tested at a younger age or more often.  A one-time screening for abdominal aortic aneurysm (AAA) by ultrasound is recommended for men aged 10-75 years who are current or former smokers. What should I know about preventing infection? Hepatitis B  If you have a higher risk for hepatitis B, you should be screened for this virus. Talk with your health care provider to find out if you are at risk for hepatitis B infection. Hepatitis C  Blood testing is recommended for:  Everyone born from [birth year] through 1964.  Anyone with known risk factors for hepatitis C. Sexually Transmitted Diseases (STDs)  You should be screened each year for STDs including gonorrhea and chlamydia if:  You are sexually active and are younger than 65 years of age.  You are older than 65 years of age and your health care provider tells you that you are at risk for this type of infection.  Your sexual activity has changed since you were last screened and you are at an increased risk for chlamydia or gonorrhea. Ask your health care provider if you are at risk.  Talk with your health care provider about whether you are at high risk of being infected with HIV. Your health care provider may recommend a  prescription medicine to help prevent HIV infection. What else can I do?  Schedule regular health, dental, and eye exams.  Stay current with your vaccines (immunizations).  Do not use any tobacco products, such as cigarettes, chewing tobacco, and e-cigarettes. If you need help quitting, ask your health care provider.  Limit alcohol intake to no more than 2 drinks per day. One drink equals 12 ounces of beer, 5 ounces of wine, or 1 ounces of hard liquor.  Do not use street drugs.  Do not share needles.  Ask your health care provider for help if you need support or information about quitting drugs.  Tell your health care provider if you often feel depressed.  Tell your health care provider if you have ever been abused or do not feel safe at home. This information is not intended to replace advice given to you by your health care provider. Make sure you discuss any questions you have with your health care provider. Document Released: 12/14/2007 Document Revised: 02/14/2016 Document Reviewed:  03/21/2015 Elsevier Interactive Patient Education  2017 [employer].

## 2016-10-08 NOTE — Progress Notes (Deleted)
There were no vitals taken for this visit.   Subjective:    Patient ID: Nathan Fields, male    DOB: May 09, 1951, 66 y.o.   MRN: [id number]  HPI: Nathan Fields is a 66 y.o. male presenting on 10/08/2016 for comprehensive medical examination. Current medical complaints include:{Blank single:19197::"none","***"}  He currently lives with: Interim Problems from his last visit: {Blank single:19197::"yes","no"}  Depression Screen done today and results listed below:  Depression screen [facility] 2/9 09/23/2016 09/05/2016 02/05/2016 01/08/2016 02/15/2015  Decreased Interest 0 0 3 3 0  Down, Depressed, Hopeless 0 1 0 3 0  PHQ - 2 Score 0 1 3 6  0  Altered sleeping - - 0 0 -  Tired, decreased energy - - 3 3 -  Change in appetite - - 0 0 -  Feeling bad or failure about yourself  - - 1 1 -  Trouble concentrating - - 0 3 -  Moving slowly or fidgety/restless - - 0 2 -  Suicidal thoughts - - 0 0 -  PHQ-9 Score - - 7 15 -  Difficult doing work/chores - - - Very difficult -    Past Medical History:  Past Medical History:  Diagnosis Date  . Arthritis    right foot  . CAD (coronary artery disease)   . Chronic low back pain   . Diabetes mellitus without complication ([place])   . Diabetes mellitus without complication ([place])   . GERD (gastroesophageal reflux disease)   . Hyperlipidemia   . Hypertension   . Impaired fasting glucose   . Neuromuscular disorder ([place])   . Plantar fasciitis   . Syncope     Surgical History:  Past Surgical History:  Procedure Laterality Date  . CARDIAC CATHETERIZATION    . COLONOSCOPY  2011   Polyp removed, follow up in 5 years    Medications:  Current Outpatient Prescriptions on File Prior to Visit  Medication Sig  . aspirin 81 MG tablet Take 81 mg by mouth daily.  Marland Kitchen lisinopril (PRINIVIL,ZESTRIL) 30 MG tablet Take 1 tablet (30 mg total) by mouth daily.  . niacin (NIASPAN) 1000 MG CR tablet Take 2 tablets (2,000 mg total) by mouth at bedtime.   No current  facility-administered medications on file prior to visit.     Allergies:  Allergies  Allergen Reactions  . Statins Other (See Comments)    fatigue  . Zetia [Ezetimibe] Other (See Comments)    Constipation    Social History:  Social History   Social History  . Marital status: Married    Spouse name: N/A  . Number of children: 2  . Years of education: 9th grade   Occupational History  . Not on file.   Social History Main Topics  . Smoking status: Never Smoker  . Smokeless tobacco: Never Used  . Alcohol use 0.0 oz/week     Comment: on rare occasion  . Drug use: No  . Sexual activity: Not Currently   Other Topics Concern  . Not on file   Social History Narrative  . No narrative on file   History  Smoking Status  . Never Smoker  Smokeless Tobacco  . Never Used   History  Alcohol Use  . 0.0 oz/week    Comment: on rare occasion    Family History:  Family History  Problem Relation Age of Onset  . Heart disease Mother   . Hyperlipidemia Mother   . Diabetes Father   . Heart attack Father   .  Hyperlipidemia Father     Past medical history, surgical history, medications, allergies, family history and social history reviewed with patient today and changes made to appropriate areas of the chart.   ROS  All other ROS negative except what is listed above and in the HPI.      Objective:    There were no vitals taken for this visit.  Wt Readings from Last 3 Encounters:  09/23/16 168 lb (76.2 kg)  09/05/16 165 lb (74.8 kg)  08/21/16 167 lb (75.8 kg)    Physical Exam  Results for orders placed or performed in visit on 09/23/16  LP+ALT+AST Piccolo, Waived  Result Value Ref Range   ALT (SGPT) Piccolo, Waived 19 10 - 47 U/L   AST (SGOT) Piccolo, Waived 32 11 - 38 U/L   Cholesterol Piccolo, Waived 280 (H) <200 mg/dL   HDL Chol Piccolo, Waived 67 >59 mg/dL   Triglycerides Piccolo,Waived 117 <150 mg/dL   Chol/HDL Ratio Piccolo,Waive 4.2 mg/dL   LDL Chol  Calc Piccolo Waived 190 (H) <100 mg/dL   VLDL Chol Calc Piccolo,Waive 23 <30 mg/dL      Assessment & Plan:   Problem List Items Addressed This Visit    None       Discussed aspirin prophylaxis for myocardial infarction prevention and decision was made to continue ASA  LABORATORY TESTING:  Health maintenance labs ordered last visit as discussed above.   The natural history of prostate cancer and ongoing controversy regarding screening and potential treatment outcomes of prostate cancer has been discussed with the patient. The meaning of a false positive PSA and a false negative PSA has been discussed. He indicates understanding of the limitations of this screening test and wished to proceed with screening PSA testing last visit.   IMMUNIZATIONS:   - Tdap: Tetanus vaccination status reviewed: {tetanus status:315746}. - Influenza: {Blank single:19197::"Up to date","Administered today","Postponed to flu season","Refused","Given elsewhere"} - Pneumovax: {Blank single:19197::"Up to date","Administered today","Not applicable","Refused","Given elsewhere"}  SCREENING: - Colonoscopy: Up to date  Discussed with patient purpose of the colonoscopy is to detect colon cancer at curable precancerous or early stages   PATIENT COUNSELING:    Sexuality: Discussed sexually transmitted diseases, partner selection, use of condoms, avoidance of unintended pregnancy  and contraceptive alternatives.   Advised to avoid cigarette smoking.  I discussed with the patient that most people either abstain from alcohol or drink within safe limits (<=14/week and <=4 drinks/occasion for males, <=7/weeks and <= 3 drinks/occasion for females) and that the risk for alcohol disorders and other health effects rises proportionally with the number of drinks per week and how often a drinker exceeds daily limits.  Discussed cessation/primary prevention of drug use and availability of treatment for abuse.   Diet: Encouraged  to adjust caloric intake to maintain  or achieve ideal body weight, to reduce intake of dietary saturated fat and total fat, to limit sodium intake by avoiding high sodium foods and not adding table salt, and to maintain adequate dietary potassium and calcium preferably from fresh fruits, vegetables, and low-fat dairy products.    stressed the importance of regular exercise  Injury prevention: Discussed safety belts, safety helmets, smoke detector, smoking near bedding or upholstery.   Dental health: Discussed importance of regular tooth brushing, flossing, and dental visits.   Follow up plan: NEXT PREVENTATIVE PHYSICAL DUE IN 1 YEAR. No Follow-up on file.

## 2016-10-08 NOTE — Assessment & Plan Note (Signed)
Rechecking levels today. Await results. Call with any concerns.  

## 2016-10-08 NOTE — Progress Notes (Signed)
BP (!) 144/91 (BP Location: Left Arm, Patient Position: Sitting, Cuff Size: Normal)   Pulse 74   Temp 97.5 F (36.4 C)   Ht 5\' 5"  (1.651 m)   Wt 168 lb 11.2 oz (76.5 kg)   BMI 28.07 kg/m    Subjective:    Patient ID: Nathan Fields, male    DOB: [birth year], 66 y.o.   MRN: [id number]  HPI: TAYSHAWN PURNELL is a 66 y.o. male presenting on 10/08/2016 for comprehensive medical examination. Current medical complaints include:  HYPERTENSION / HYPERLIPIDEMIA Satisfied with current treatment? yes Duration of hypertension: chronic BP monitoring frequency: not checking BP medication side effects: no Past BP meds: lisinopril Duration of hyperlipidemia: chronic Cholesterol medication side effects: yes Cholesterol supplements: niacin Past cholesterol medications: side effects to zetia and statins Medication compliance: excellent compliance Aspirin: yes Recent stressors: no Recurrent headaches: no Visual changes: no Palpitations: no Dyspnea: no Chest pain: no Lower extremity edema: no Dizzy/lightheaded: no  He currently lives with: wife Interim Problems from his last visit: no  Functional Status Survey: Is the patient deaf or have difficulty hearing?: No Does the patient have difficulty seeing, even when wearing glasses/contacts?: No Does the patient have difficulty concentrating, remembering, or making decisions?: No Does the patient have difficulty walking or climbing stairs?: No Does the patient have difficulty dressing or bathing?: No Does the patient have difficulty doing errands alone such as visiting a doctor's office or shopping?: No  FALL RISK: Fall Risk  10/08/2016 09/05/2016 02/15/2015  Falls in the past year? No No No    Depression Screen Depression screen [facility] 2/9 10/08/2016 09/23/2016 09/05/2016 02/05/2016 01/08/2016  Decreased Interest 0 0 0 3 3  Down, Depressed, Hopeless 0 0 1 0 3  PHQ - 2 Score 0 0 1 3 6   Altered sleeping - - - 0 0  Tired, decreased energy - - - 3  3  Change in appetite - - - 0 0  Feeling bad or failure about yourself  - - - 1 1  Trouble concentrating - - - 0 3  Moving slowly or fidgety/restless - - - 0 2  Suicidal thoughts - - - 0 0  PHQ-9 Score - - - 7 15  Difficult doing work/chores - - - - Very difficult    Advanced Directives See appropriate area of chart  Past Medical History:  Past Medical History:  Diagnosis Date  . Arthritis    right foot  . CAD (coronary artery disease)   . Chronic low back pain   . Diabetes mellitus without complication ([place])   . Diabetes mellitus without complication ([place])   . GERD (gastroesophageal reflux disease)   . Hyperlipidemia   . Hypertension   . Impaired fasting glucose   . Neuromuscular disorder ([place])   . Plantar fasciitis   . Syncope     Surgical History:  Past Surgical History:  Procedure Laterality Date  . CARDIAC CATHETERIZATION    . COLONOSCOPY  2011   Polyp removed, follow up in 5 years    Medications:  Current Outpatient Prescriptions on File Prior to Visit  Medication Sig  . aspirin 81 MG tablet Take 81 mg by mouth daily.  Marland Kitchen lisinopril (PRINIVIL,ZESTRIL) 30 MG tablet Take 1 tablet (30 mg total) by mouth daily.  . niacin (NIASPAN) 1000 MG CR tablet Take 2 tablets (2,000 mg total) by mouth at bedtime.   No current facility-administered medications on file prior to visit.     Allergies:  Allergies  Allergen Reactions  . Statins Other (See Comments)    fatigue  . Zetia [Ezetimibe] Other (See Comments)    Constipation    Social History:  Social History   Social History  . Marital status: Married    Spouse name: N/A  . Number of children: 2  . Years of education: 9th grade   Occupational History  . Not on file.   Social History Main Topics  . Smoking status: Never Smoker  . Smokeless tobacco: Never Used  . Alcohol use 0.0 oz/week     Comment: on rare occasion  . Drug use: No  . Sexual activity: Not Currently   Other Topics Concern  . Not on  file   Social History Narrative  . No narrative on file   History  Smoking Status  . Never Smoker  Smokeless Tobacco  . Never Used   History  Alcohol Use  . 0.0 oz/week    Comment: on rare occasion    Family History:  Family History  Problem Relation Age of Onset  . Heart disease Mother   . Hyperlipidemia Mother   . Diabetes Father   . Heart attack Father   . Hyperlipidemia Father     Past medical history, surgical history, medications, allergies, family history and social history reviewed with patient today and changes made to appropriate areas of the chart.   Review of Systems  Constitutional: Negative.   HENT: Negative.   Eyes: Negative.   Respiratory: Positive for cough. Negative for hemoptysis, sputum production, shortness of breath and wheezing.   Cardiovascular: Negative.   Gastrointestinal: Negative.   Genitourinary: Negative.   Musculoskeletal: Positive for back pain and myalgias. Negative for falls, joint pain and neck pain.  Skin: Negative.   Neurological: Negative.   Endo/Heme/Allergies: Negative.   Psychiatric/Behavioral: Negative.     All other ROS negative except what is listed above and in the HPI.      Objective:    BP (!) 144/91 (BP Location: Left Arm, Patient Position: Sitting, Cuff Size: Normal)   Pulse 74   Temp 97.5 F (36.4 C)   Ht 5\' 5"  (1.651 m)   Wt 168 lb 11.2 oz (76.5 kg)   BMI 28.07 kg/m   Wt Readings from Last 3 Encounters:  10/08/16 168 lb 11.2 oz (76.5 kg)  09/23/16 168 lb (76.2 kg)  09/05/16 165 lb (74.8 kg)     Hearing Screening   125Hz  250Hz  500Hz  1000Hz  2000Hz  3000Hz  4000Hz  6000Hz  8000Hz   Right ear:   20 20 20   Fail    Left ear:   20 20 20   Fail      Physical Exam  Constitutional: He is oriented to person, place, and time. He appears well-developed and well-nourished. No distress.  HENT:  Head: Normocephalic and atraumatic.  Right Ear: Hearing, tympanic membrane, external ear and ear canal normal.  Left Ear:  Hearing, tympanic membrane, external ear and ear canal normal.  Nose: Nose normal.  Mouth/Throat: Uvula is midline, oropharynx is clear and moist and mucous membranes are normal. No oropharyngeal exudate.  Eyes: Conjunctivae, EOM and lids are normal. Pupils are equal, round, and reactive to light. Right eye exhibits no discharge. Left eye exhibits no discharge. No scleral icterus.  Neck: Normal range of motion. Neck supple. No JVD present. No tracheal deviation present. No thyromegaly present.  Cardiovascular: Normal rate, regular rhythm, normal heart sounds and intact distal pulses.  Exam reveals no gallop and no friction rub.  No murmur heard. Pulmonary/Chest: Effort normal and breath sounds normal. No stridor. No respiratory distress. He has no wheezes. He has no rales. He exhibits no tenderness.  Abdominal: Soft. Bowel sounds are normal. He exhibits no distension and no mass. There is no tenderness. There is no rebound and no guarding.  Genitourinary:  Genitourinary Comments: Penis and rectal exam deferred at patient's request  Musculoskeletal: Normal range of motion. He exhibits no edema, tenderness or deformity.  Lymphadenopathy:    He has no cervical adenopathy.  Neurological: He is alert and oriented to person, place, and time. He has normal reflexes. He displays normal reflexes. No cranial nerve deficit. He exhibits normal muscle tone. Coordination normal.  Skin: Skin is warm, dry and intact. No rash noted. He is not diaphoretic. No erythema. No pallor.  Psychiatric: He has a normal mood and affect. His speech is normal and behavior is normal. Judgment and thought content normal. Cognition and memory are normal.  Nursing note and vitals reviewed.   6CIT Screen 10/08/2016  What Year? 0 points  What month? 3 points  What time? 0 points  Count back from 20 0 points  Months in reverse 0 points  Repeat phrase 4 points  Total Score 7        Results for orders placed or performed  in visit on 09/23/16  LP+ALT+AST Piccolo, [employer]  Result Value Ref Range   ALT (SGPT) Piccolo, Waived 19 10 - 47 U/L   AST (SGOT) Piccolo, Waived 32 11 - 38 U/L   Cholesterol Piccolo, Waived 280 (H) <200 mg/dL   HDL Chol Piccolo, Waived 67 >59 mg/dL   Triglycerides Piccolo,Waived 117 <150 mg/dL   Chol/HDL Ratio Piccolo,Waive 4.2 mg/dL   LDL Chol Calc Piccolo Waived 190 (H) <100 mg/dL   VLDL Chol Calc Piccolo,Waive 23 <30 mg/dL      Assessment & Plan:   Problem List Items Addressed This Visit      Cardiovascular and Mediastinum   Hypertension    Still elevated today. Will give lisinopril a little bit more time and recheck in 1 month. Call with any concerns.       Relevant Medications   sildenafil (VIAGRA) 100 MG tablet   Other Relevant Orders   Comprehensive metabolic panel     Other   Hyperlipidemia    Rechecking levels today. Await results. Call with any concerns       Relevant Medications   sildenafil (VIAGRA) 100 MG tablet   Other Relevant Orders   Comprehensive metabolic panel   Lipid Panel w/o Chol/HDL Ratio    Other Visit Diagnoses    Medicare annual wellness visit, subsequent    -  Primary   Preventative care discussed as below. Declines vaccines. Screening labs last visit. Colonoscopy up to date. Call with any concerns.        Preventative Services:  Health Risk Assessment and Personalized Prevention Plan: Done today Bone Mass Measurements: N/A CVD Screening: Done today Colon Cancer Screening: Up to date Depression Screening: Done today Diabetes Screening: Done today Glaucoma Screening: See your eye doctor Hepatitis B vaccine: N/A Hepatitis C screening: up to date HIV Screening: up to date Flu Vaccine: Declined Lung cancer Screening: N/A Obesity Screening: Done today Pneumonia Vaccines (2): STI Screening: N/A PSA screening: up to date  Discussed aspirin prophylaxis for myocardial infarction prevention and decision was made to continue  ASA  LABORATORY TESTING:  Health maintenance labs ordered last visit as discussed above.   The natural history  of prostate cancer and ongoing controversy regarding screening and potential treatment outcomes of prostate cancer has been discussed with the patient. The meaning of a false positive PSA and a false negative PSA has been discussed. He indicates understanding of the limitations of this screening test and wished  to proceed with screening PSA testing, which was done last visit.   IMMUNIZATIONS:   - Tdap: Tetanus vaccination status reviewed: Declined. - Influenza: Postponed to flu season - Pneumovax: Refused - Prevnar: Not applicable - Zostavax vaccine: Not applicable  SCREENING: - Colonoscopy: Up to date  Discussed with patient purpose of the colonoscopy is to detect colon cancer at curable precancerous or early stages   PATIENT COUNSELING:    Sexuality: Discussed sexually transmitted diseases, partner selection, use of condoms, avoidance of unintended pregnancy  and contraceptive alternatives.   Advised to avoid cigarette smoking.  I discussed with the patient that most people either abstain from alcohol or drink within safe limits (<=14/week and <=4 drinks/occasion for males, <=7/weeks and <= 3 drinks/occasion for females) and that the risk for alcohol disorders and other health effects rises proportionally with the number of drinks per week and how often a drinker exceeds daily limits.  Discussed cessation/primary prevention of drug use and availability of treatment for abuse.   Diet: Encouraged to adjust caloric intake to maintain  or achieve ideal body weight, to reduce intake of dietary saturated fat and total fat, to limit sodium intake by avoiding high sodium foods and not adding table salt, and to maintain adequate dietary potassium and calcium preferably from fresh fruits, vegetables, and low-fat dairy products.    stressed the importance of regular exercise  Injury  prevention: Discussed safety belts, safety helmets, smoke detector, smoking near bedding or upholstery.   Dental health: Discussed importance of regular tooth brushing, flossing, and dental visits.   Follow up plan: NEXT PREVENTATIVE PHYSICAL DUE IN 1 YEAR. Return in about 4 weeks (around 11/05/2016) for BP follow up.

## 2016-10-08 NOTE — Assessment & Plan Note (Signed)
Still elevated today. Will give lisinopril a little bit more time and recheck in 1 month. Call with any concerns.

## 2016-10-09 LAB — COMPREHENSIVE METABOLIC PANEL
ALBUMIN: 4.4 g/dL (ref 3.6–4.8)
ALT: 17 IU/L (ref 0–44)
AST: 18 IU/L (ref 0–40)
Albumin/Globulin Ratio: 2.1 (ref 1.2–2.2)
Alkaline Phosphatase: 85 IU/L (ref 39–117)
BILIRUBIN TOTAL: 0.3 mg/dL (ref 0.0–1.2)
BUN / CREAT RATIO: 15 (ref 10–24)
BUN: 15 mg/dL (ref 8–27)
CALCIUM: 9.4 mg/dL (ref 8.6–10.2)
CO2: 24 mmol/L (ref 18–29)
CREATININE: 0.98 mg/dL (ref 0.76–1.27)
Chloride: 103 mmol/L (ref 96–106)
GFR calc non Af Amer: 81 mL/min/{1.73_m2} (ref >59)
GFR, EST AFRICAN AMERICAN: 94 mL/min/{1.73_m2} (ref >59)
GLUCOSE: 117 mg/dL — AB (ref 65–99)
Globulin, Total: 2.1 g/dL (ref 1.5–4.5)
Potassium: 4.5 mmol/L (ref 3.5–5.2)
Sodium: 142 mmol/L (ref 134–144)
TOTAL PROTEIN: 6.5 g/dL (ref 6.0–8.5)

## 2016-10-09 LAB — LIPID PANEL W/O CHOL/HDL RATIO
CHOLESTEROL TOTAL: 254 mg/dL — AB (ref 100–199)
HDL: 66 mg/dL (ref >39)
LDL CALC: 167 mg/dL — AB (ref 0–99)
TRIGLYCERIDES: 106 mg/dL (ref 0–149)
VLDL CHOLESTEROL CAL: 21 mg/dL (ref 5–40)

## 2016-11-07 ENCOUNTER — Ambulatory Visit: Payer: Medicare Other | Admitting: Family Medicine

## 2016-11-11 ENCOUNTER — Encounter: Payer: Self-pay | Admitting: Family Medicine

## 2016-11-11 ENCOUNTER — Ambulatory Visit (INDEPENDENT_AMBULATORY_CARE_PROVIDER_SITE_OTHER): Payer: Medicare Other | Admitting: Family Medicine

## 2016-11-11 VITALS — BP 120/74 | HR 73 | Wt 166.0 lb

## 2016-11-11 DIAGNOSIS — T63451A Toxic effect of venom of hornets, accidental (unintentional), initial encounter: Secondary | ICD-10-CM

## 2016-11-11 DIAGNOSIS — I10 Essential (primary) hypertension: Secondary | ICD-10-CM

## 2016-11-11 DIAGNOSIS — E782 Mixed hyperlipidemia: Secondary | ICD-10-CM | POA: Diagnosis not present

## 2016-11-11 DIAGNOSIS — K625 Hemorrhage of anus and rectum: Secondary | ICD-10-CM

## 2016-11-11 NOTE — Assessment & Plan Note (Signed)
Unable to tolerate niacin due to rash. Call with any concerns.

## 2016-11-11 NOTE — Assessment & Plan Note (Signed)
Under good control. Not orthostatic. Patient complaining of dizziness when looking up. Will have him be careful when going from sitting to standing and monitor his BP at home. Will leave regimen alone. Call with any concerns.

## 2016-11-11 NOTE — Progress Notes (Signed)
BP 120/74   Pulse 73   Wt 166 lb (75.3 kg)   SpO2 97%   BMI 27.62 kg/m    Subjective:    Patient ID: Nathan Fields, male    DOB: 04-23-1951, 66 y.o.   MRN: [id number]  HPI: Nathan Fields is a 66 y.o. male  Chief Complaint  Patient presents with  . Follow-up  . Insect Bite    Left hand. Hornet.   . Blood In Stools  . Dizziness   HYPERTENSION Hypertension status: controlled  Satisfied with current treatment? no Duration of hypertension: chronic BP monitoring frequency:  not checking BP medication side effects:  yes Medication compliance: good compliance Previous BP meds: lisinopril Aspirin: yes Recurrent headaches: no Visual changes: no Palpitations: no Dyspnea: no Chest pain: no Lower extremity edema: no Dizzy/lightheaded: yes- only when he looks up, only happened 1x this morning.  Niacin has been giving him a rash  LUMP- but by a hornet on his L hand Duration: This morning Location: L thumb Onset: sudden Painful: yes Discomfort: yes Status:  not changing Trauma: yes Redness: no Bruising: no Recent infection: no Swollen lymph nodes: no  RECTAL BLEEDING Duration: since the beginning of the year Bright red rectal bleeding: yes  Amount of blood: moderate  Frequency: every time he goes to the bathroom Melena: no  Spotting on toilet tissue: yes  Anal fullness: no  Perianal pain: no  Severity: moderate Perianal irritation/itching: no  Constipation: no  Chronic straining/valsava:  yes  Anal trauma/intercourse: no  Hemorrhoids: yes  Previous colonoscopy: yes   Relevant past medical, surgical, family and social history reviewed and updated as indicated. Interim medical history since our last visit reviewed. Allergies and medications reviewed and updated.  Review of Systems  Constitutional: Negative.   Respiratory: Negative.   Cardiovascular: Negative.   Gastrointestinal: Positive for anal bleeding and blood in stool. Negative for abdominal  distention, abdominal pain, constipation, diarrhea, nausea, rectal pain and vomiting.  Musculoskeletal: Positive for arthralgias, back pain and myalgias. Negative for gait problem, joint swelling, neck pain and neck stiffness.  Neurological: Positive for dizziness and light-headedness. Negative for tremors, seizures, syncope, facial asymmetry, speech difficulty, weakness, numbness and headaches.  Psychiatric/Behavioral: Negative.     Per HPI unless specifically indicated above     Objective:    BP 120/74   Pulse 73   Wt 166 lb (75.3 kg)   SpO2 97%   BMI 27.62 kg/m   Wt Readings from Last 3 Encounters:  11/11/16 166 lb (75.3 kg)  10/08/16 168 lb 11.2 oz (76.5 kg)  09/23/16 168 lb (76.2 kg)    Orthostatic VS for the past 24 hrs:  BP- Lying Pulse- Lying BP- Sitting Pulse- Sitting BP- Standing at 0 minutes Pulse- Standing at 0 minutes  11/11/16 1445 133/70 68 120/75 76 121/69 72    Physical Exam  Constitutional: He is oriented to person, place, and time. He appears well-developed and well-nourished. No distress.  HENT:  Head: Normocephalic and atraumatic.  Right Ear: Hearing normal.  Left Ear: Hearing normal.  Nose: Nose normal.  Eyes: Conjunctivae and lids are normal. Right eye exhibits no discharge. Left eye exhibits no discharge. No scleral icterus.  Cardiovascular: Normal rate, regular rhythm, normal heart sounds and intact distal pulses.  Exam reveals no gallop and no friction rub.   No murmur heard. Pulmonary/Chest: Effort normal and breath sounds normal. No respiratory distress. He has no wheezes. He has no rales. He exhibits no tenderness.  Musculoskeletal: Normal range of motion. He exhibits edema (swelling and tenderness L thumb).  Neurological: He is alert and oriented to person, place, and time.  Skin: Skin is warm, dry and intact. Rash (erythematous papules all over chest) noted. He is not diaphoretic. No erythema. No pallor.  Psychiatric: He has a normal mood and  affect. His speech is normal and behavior is normal. Judgment and thought content normal. Cognition and memory are normal.  Nursing note and vitals reviewed.   Results for orders placed or performed in visit on 10/08/16  Comprehensive metabolic panel  Result Value Ref Range   Glucose 117 (H) 65 - 99 mg/dL   BUN 15 8 - 27 mg/dL   Creatinine, Ser 0.98 0.76 - 1.27 mg/dL   GFR calc non Af Amer 81 >59 mL/min/1.73   GFR calc Af Amer 94 >59 mL/min/1.73   BUN/Creatinine Ratio 15 10 - 24   Sodium 142 134 - 144 mmol/L   Potassium 4.5 3.5 - 5.2 mmol/L   Chloride 103 96 - 106 mmol/L   CO2 24 18 - 29 mmol/L   Calcium 9.4 8.6 - 10.2 mg/dL   Total Protein 6.5 6.0 - 8.5 g/dL   Albumin 4.4 3.6 - 4.8 g/dL   Globulin, Total 2.1 1.5 - 4.5 g/dL   Albumin/Globulin Ratio 2.1 1.2 - 2.2   Bilirubin Total 0.3 0.0 - 1.2 mg/dL   Alkaline Phosphatase 85 39 - 117 IU/L   AST 18 0 - 40 IU/L   ALT 17 0 - 44 IU/L  Lipid Panel w/o Chol/HDL Ratio  Result Value Ref Range   Cholesterol, Total 254 (H) 100 - 199 mg/dL   Triglycerides 106 0 - 149 mg/dL   HDL 66 >39 mg/dL   VLDL Cholesterol Cal 21 5 - 40 mg/dL   LDL Calculated 167 (H) 0 - 99 mg/dL      Assessment & Plan:   Problem List Items Addressed This Visit      Cardiovascular and Mediastinum   Hypertension - Primary    Under good control. Not orthostatic. Patient complaining of dizziness when looking up. Will have him be careful when going from sitting to standing and monitor his BP at home. Will leave regimen alone. Call with any concerns.       Relevant Orders   Basic metabolic panel     Other   Hyperlipidemia    Unable to tolerate niacin due to rash. Call with any concerns.        Other Visit Diagnoses    Rectal bleeding       Referral to GI made today.   Relevant Orders   Ambulatory referral to Gastroenterology   Hornet sting, accidental or unintentional, initial encounter       Use ice. Call with any concerns.        Follow up  plan: Return for September- Follow up.

## 2016-11-12 LAB — BASIC METABOLIC PANEL
BUN/Creatinine Ratio: 19 (ref 10–24)
BUN: 16 mg/dL (ref 8–27)
CALCIUM: 9.1 mg/dL (ref 8.6–10.2)
CHLORIDE: 105 mmol/L (ref 96–106)
CO2: 23 mmol/L (ref 18–29)
CREATININE: 0.83 mg/dL (ref 0.76–1.27)
GFR calc Af Amer: 107 mL/min/{1.73_m2} (ref >59)
GFR calc non Af Amer: 92 mL/min/{1.73_m2} (ref >59)
GLUCOSE: 100 mg/dL — AB (ref 65–99)
Potassium: 4 mmol/L (ref 3.5–5.2)
Sodium: 141 mmol/L (ref 134–144)

## 2016-12-10 ENCOUNTER — Encounter: Payer: Self-pay | Admitting: Family Medicine

## 2016-12-11 ENCOUNTER — Encounter: Payer: Self-pay | Admitting: Family Medicine

## 2016-12-17 ENCOUNTER — Ambulatory Visit (INDEPENDENT_AMBULATORY_CARE_PROVIDER_SITE_OTHER): Payer: Medicare Other | Admitting: Gastroenterology

## 2016-12-17 ENCOUNTER — Encounter: Payer: Self-pay | Admitting: Gastroenterology

## 2016-12-17 ENCOUNTER — Other Ambulatory Visit: Payer: Self-pay

## 2016-12-17 VITALS — BP 139/79 | HR 54 | Temp 97.9°F | Ht 65.0 in | Wt 162.8 lb

## 2016-12-17 DIAGNOSIS — K625 Hemorrhage of anus and rectum: Secondary | ICD-10-CM

## 2016-12-17 MED ORDER — NA SULFATE-K SULFATE-MG SULF 17.5-3.13-1.6 GM/177ML PO SOLN: 1.0000 | Freq: Once | ORAL | 0 refills | Status: AC

## 2016-12-17 NOTE — Progress Notes (Signed)
[name] MD, MRCP(U.K) [address]  [place]  [place], [place] [address]  Main: [phone]  Fax: [phone]   Gastroenterology Consultation  Referring Provider:     Valerie Roys, DO Primary Care Physician:  Valerie Roys, DO Primary Gastroenterologist:  Dr. [name]  Reason for Consultation:     Rectal bleeding         HPI:   OLUMIDE DOLINGER is a 66 y.o. y/o male referred for consultation & management  by Dr. Wynetta Emery, Megan P, DO.    He has been referred for rectal bleeding .   Rectal bleeding :  Onset and where was blood seen  :Since X mas- every day , blood when he wipes,no perianal itching  Frequency of bowel movements :1-2 times a day  Consistency : last few weeks since he started niacin - harder  Change in shape of stool: normal  Pain associated with bowel movements:none  Blood thinner usage:no  NSAID's: no  Prior colonoscopy :in 2007 -recalls h ehad a few polyps- was asked to return in 5 years Family history of colon cancer or polyps:none  Weight loss: 5-10 lbs - trying to stay away from "junk food"   Past Medical History:  Diagnosis Date  . Arthritis    right foot  . CAD (coronary artery disease)   . Chronic low back pain   . Diabetes mellitus without complication ([place])   . Diabetes mellitus without complication ([place])   . GERD (gastroesophageal reflux disease)   . Hyperlipidemia   . Hypertension   . Impaired fasting glucose   . Neuromuscular disorder ([place])   . Plantar fasciitis   . Syncope     Past Surgical History:  Procedure Laterality Date  . CARDIAC CATHETERIZATION    . COLONOSCOPY  2011   Polyp removed, follow up in 5 years    Prior to Admission medications   Medication Sig Start Date End Date Taking? Authorizing Provider  aspirin 81 MG tablet Take 81 mg by mouth daily.    [provider]  Cholecalciferol (VITAMIN D3) 3000 units TABS Take by mouth.    [provider]  Cyanocobalamin (VITAMIN B 12  PO) Take by mouth.    [provider]  lisinopril (PRINIVIL,ZESTRIL) 30 MG tablet Take 1 tablet (30 mg total) by mouth daily. 09/23/16   Park Liter P, DO  niacin (NIASPAN) 500 MG CR tablet  11/15/16   [provider]  QUEtiapine (SEROQUEL) 25 MG tablet  11/15/16   [provider]  sildenafil (VIAGRA) 100 MG tablet Take 0.5-1 tablets (50-100 mg total) by mouth daily as needed for erectile dysfunction. 10/08/16   Valerie Roys, DO    Family History  Problem Relation Age of Onset  . Heart disease Mother   . Hyperlipidemia Mother   . Diabetes Father   . Heart attack Father   . Hyperlipidemia Father      Social History  Substance Use Topics  . Smoking status: Never Smoker  . Smokeless tobacco: Never Used  . Alcohol use 0.0 oz/week     Comment: on rare occasion    Allergies as of 12/17/2016 - Review Complete 11/11/2016  Allergen Reaction Noted  . Statins Other (See Comments) 02/05/2016  . Zetia [ezetimibe] Other (See Comments) 02/05/2016  . Niacin and related Rash 11/11/2016    Review of Systems:    All systems reviewed and negative except where noted in HPI.   Physical Exam:  There  were no vitals taken for this visit. No LMP for male patient. Psych:  Alert and cooperative. Normal mood and affect. General:   Alert,  Well-developed, well-nourished, pleasant and cooperative in NAD Head:  Normocephalic and atraumatic. Eyes:  Sclera clear, no icterus.   Conjunctiva pink. Ears:  Normal auditory acuity. Nose:  No deformity, discharge, or lesions. Mouth:  No deformity or lesions,oropharynx pink & moist. Neck:  Supple; no masses or thyromegaly. Lungs:  Respirations even and unlabored.  Clear throughout to auscultation.   No wheezes, crackles, or rhonchi. No acute distress. Heart:  Regular rate and rhythm; no murmurs, clicks, rubs, or gallops. Abdomen:  Normal bowel sounds.  No bruits.  Soft, non-tender and non-distended without masses, hepatosplenomegaly  or hernias noted.  No guarding or rebound tenderness.    Msk:  Symmetrical without gross deformities. Good, equal movement & strength bilaterally. Neurologic:  Alert and oriented x3;  grossly normal neurologically. Skin:  Intact without significant lesions or rashes. No jaundice. Lymph Nodes:  No significant cervical adenopathy. Psych:  Alert and cooperative. Normal mood and affect.  Imaging Studies: No results found.  Assessment and Plan:   FRIEND DORFMAN is a 31 y.o. y/o male has been referred for Rectal bleeding .   Plan   1. Colonoscopy    I have discussed alternative options, risks & benefits,  which include, but are not limited to, bleeding, infection, perforation,respiratory complication & drug reaction.  The patient agrees with this plan & written consent will be obtained.    Follow up as needed   Dr [name] MD,MRCP(U.K)

## 2016-12-23 ENCOUNTER — Other Ambulatory Visit: Payer: Self-pay

## 2016-12-23 DIAGNOSIS — K625 Hemorrhage of anus and rectum: Secondary | ICD-10-CM

## 2016-12-23 MED ORDER — PEG 3350-KCL-NA BICARB-NACL 420 G PO SOLR: 4000.0000 mL | Freq: Once | ORAL | 0 refills | Status: AC

## 2017-01-14 ENCOUNTER — Ambulatory Visit: Payer: Medicare Other | Admitting: Anesthesiology

## 2017-01-14 ENCOUNTER — Encounter
Admission: RE | Disposition: A | Discharge status: Home / Self Care | Payer: Self-pay | Source: Ambulatory Visit | Attending: Gastroenterology

## 2017-01-14 ENCOUNTER — Ambulatory Visit
Admission: RE | Admit: 2017-01-14 | Discharge: 2017-01-14 | Disposition: A | Discharge status: Home / Self Care | Payer: Medicare Other | Source: Ambulatory Visit | Attending: Gastroenterology | Admitting: Gastroenterology

## 2017-01-14 DIAGNOSIS — G8929 Other chronic pain: Secondary | ICD-10-CM | POA: Insufficient documentation

## 2017-01-14 DIAGNOSIS — K621 Rectal polyp: Secondary | ICD-10-CM

## 2017-01-14 DIAGNOSIS — F419 Anxiety disorder, unspecified: Secondary | ICD-10-CM | POA: Diagnosis not present

## 2017-01-14 DIAGNOSIS — I10 Essential (primary) hypertension: Secondary | ICD-10-CM | POA: Diagnosis not present

## 2017-01-14 DIAGNOSIS — M545 Low back pain: Secondary | ICD-10-CM | POA: Insufficient documentation

## 2017-01-14 DIAGNOSIS — E785 Hyperlipidemia, unspecified: Secondary | ICD-10-CM | POA: Diagnosis not present

## 2017-01-14 DIAGNOSIS — K5730 Diverticulosis of large intestine without perforation or abscess without bleeding: Secondary | ICD-10-CM

## 2017-01-14 DIAGNOSIS — K5790 Diverticulosis of intestine, part unspecified, without perforation or abscess without bleeding: Secondary | ICD-10-CM | POA: Diagnosis not present

## 2017-01-14 DIAGNOSIS — M19071 Primary osteoarthritis, right ankle and foot: Secondary | ICD-10-CM | POA: Insufficient documentation

## 2017-01-14 DIAGNOSIS — K635 Polyp of colon: Secondary | ICD-10-CM | POA: Insufficient documentation

## 2017-01-14 DIAGNOSIS — E119 Type 2 diabetes mellitus without complications: Secondary | ICD-10-CM | POA: Diagnosis not present

## 2017-01-14 DIAGNOSIS — I2510 Atherosclerotic heart disease of native coronary artery without angina pectoris: Secondary | ICD-10-CM | POA: Diagnosis not present

## 2017-01-14 DIAGNOSIS — Z888 Allergy status to other drugs, medicaments and biological substances status: Secondary | ICD-10-CM | POA: Diagnosis not present

## 2017-01-14 DIAGNOSIS — F329 Major depressive disorder, single episode, unspecified: Secondary | ICD-10-CM | POA: Diagnosis not present

## 2017-01-14 DIAGNOSIS — K640 First degree hemorrhoids: Secondary | ICD-10-CM

## 2017-01-14 DIAGNOSIS — K625 Hemorrhage of anus and rectum: Secondary | ICD-10-CM | POA: Diagnosis not present

## 2017-01-14 DIAGNOSIS — D128 Benign neoplasm of rectum: Secondary | ICD-10-CM | POA: Diagnosis not present

## 2017-01-14 DIAGNOSIS — Z79899 Other long term (current) drug therapy: Secondary | ICD-10-CM | POA: Diagnosis not present

## 2017-01-14 DIAGNOSIS — Z7982 Long term (current) use of aspirin: Secondary | ICD-10-CM | POA: Insufficient documentation

## 2017-01-14 DIAGNOSIS — K648 Other hemorrhoids: Secondary | ICD-10-CM | POA: Diagnosis not present

## 2017-01-14 HISTORY — PX: COLONOSCOPY WITH PROPOFOL: SHX5780

## 2017-01-14 SURGERY — COLONOSCOPY WITH PROPOFOL
Anesthesia: General

## 2017-01-14 MED ORDER — PROPOFOL 500 MG/50ML IV EMUL
INTRAVENOUS | Status: AC
  Filled 2017-01-14: qty 50

## 2017-01-14 MED ORDER — PROPOFOL 10 MG/ML IV BOLUS
INTRAVENOUS | Status: DC | PRN
  Administered 2017-01-14: 20 mg via INTRAVENOUS
  Administered 2017-01-14: 60 mg via INTRAVENOUS
  Administered 2017-01-14 (×2): 20 mg via INTRAVENOUS

## 2017-01-14 MED ORDER — LIDOCAINE HCL (PF) 2 % IJ SOLN
INTRAMUSCULAR | Status: DC | PRN
  Administered 2017-01-14: 50 mg via INTRADERMAL

## 2017-01-14 MED ORDER — SODIUM CHLORIDE 0.9 % IV SOLN
INTRAVENOUS | Status: DC
  Administered 2017-01-14: 10:00:00 via INTRAVENOUS

## 2017-01-14 MED ORDER — PROPOFOL 500 MG/50ML IV EMUL
INTRAVENOUS | Status: DC | PRN
  Administered 2017-01-14: 125 ug/kg/min via INTRAVENOUS

## 2017-01-14 MED ORDER — SODIUM CHLORIDE 0.9 % IV SOLN
INTRAVENOUS | Status: DC | PRN
  Administered 2017-01-14: 11:00:00 via INTRAVENOUS

## 2017-01-14 NOTE — Op Note (Signed)
[facility] Gastroenterology Patient Name: Nathan Fields Procedure Date: 01/14/2017 10:36 AM MRN: [id number] Account #: [id number] Date of Birth: [birth year] Admit Type: Outpatient Age: 66 Room: [facility] ENDO ROOM 1 Gender: Male Note Status: Finalized Procedure:            Colonoscopy Indications:          Rectal bleeding Providers:            [name] MD, MD Referring MD:         Valerie Roys (Referring MD) Medicines:            Monitored Anesthesia Care Complications:        No immediate complications. Procedure:            Pre-Anesthesia Assessment:                       - Prior to the procedure, a History and Physical was                        performed, and patient medications, allergies and                        sensitivities were reviewed. The patient's tolerance of                        previous anesthesia was reviewed.                       - The risks and benefits of the procedure and the                        sedation options and risks were discussed with the                        patient. All questions were answered and informed                        consent was obtained.                       - ASA Grade Assessment: III - A patient with severe                        systemic disease.                       After obtaining informed consent, the colonoscope was                        passed under direct vision. Throughout the procedure,                        the patient's blood pressure, pulse, and oxygen                        saturations were monitored continuously. The                        Colonoscope was introduced through the anus and                        advanced to  the the cecum, identified by the                        appendiceal orifice, IC valve and transillumination.                        The colonoscopy was performed with ease. The patient                        tolerated the procedure well. The quality of the bowel               preparation was good. Findings:      Non-bleeding internal hemorrhoids were found during retroflexion. The       hemorrhoids were large and Grade I (internal hemorrhoids that do not       prolapse).      Multiple small and large-mouthed diverticula were found in the sigmoid       colon.      A few sessile polyps were found in the rectum. The polyps were       diminutive in size. These polyps were removed with a cold snare.       Resection and retrieval were complete.      The exam was otherwise without abnormality on direct and retroflexion       views. Impression:           - Non-bleeding internal hemorrhoids.                       - Diverticulosis in the sigmoid colon.                       - A few diminutive polyps in the rectum, removed with a                        cold snare. Resected and retrieved.                       - The examination was otherwise normal on direct and                        retroflexion views. Recommendation:       - Discharge patient to home (with escort).                       - Resume previous diet.                       - Continue present medications.                       - Await pathology results.                       - Repeat colonoscopy in 5 years for surveillance.                       - Return to GI office PRN. Procedure Code(s):    --- Professional ---                       [id number], Colonoscopy, flexible; with removal of tumor(s),  polyp(s), or other lesion(s) by snare technique Diagnosis Code(s):    --- Professional ---                       K64.0, First degree hemorrhoids                       K62.5, Hemorrhage of anus and rectum                       K62.1, Rectal polyp                       K57.30, Diverticulosis of large intestine without                        perforation or abscess without bleeding CPT copyright 2016 American Medical Association. All rights reserved. The codes documented in this report  are preliminary and upon coder review may  be revised to meet current compliance requirements. [name], MD [name] MD, MD 01/14/2017 11:02:20 AM This report has been signed electronically. Number of Addenda: 0 Note Initiated On: 01/14/2017 10:36 AM Scope Withdrawal Time: 0 hours 13 minutes 41 seconds  Total Procedure Duration: 0 hours 17 minutes 14 seconds       [facility]

## 2017-01-14 NOTE — Anesthesia Preprocedure Evaluation (Addendum)
Anesthesia Evaluation  Patient identified by MRN, date of birth, ID band Patient awake    Reviewed: Allergy & Precautions, NPO status , Patient's Chart, lab work & pertinent test results  History of Anesthesia Complications Negative for: history of anesthetic complications  Airway Mallampati: II  TM Distance: >3 FB Neck ROM: Full    Dental  (+) Poor Dentition   Pulmonary neg pulmonary ROS, neg sleep apnea, neg COPD,    breath sounds clear to auscultation- rhonchi (-) wheezing      Cardiovascular Exercise Tolerance: Good hypertension, Pt. on medications + CAD (nonocclusive on last cath ~31yrs ago')  (-) Past MI, (-) Cardiac Stents and (-) CABG  Rhythm:Regular Rate:Normal - Systolic murmurs and - Diastolic murmurs    Neuro/Psych PSYCHIATRIC DISORDERS Anxiety Depression negative neurological ROS     GI/Hepatic Neg liver ROS, GERD  ,  Endo/Other  diabetes  Renal/GU Renal disease: hx of nephrolithiasis.     Musculoskeletal  (+) Arthritis ,   Abdominal (+) - obese,   Peds  Hematology negative hematology ROS (+)   Anesthesia Other Findings Past Medical History: No date: Arthritis     Comment:  right foot No date: CAD (coronary artery disease) No date: Chronic low back pain No date: Diabetes mellitus without complication (HCC) No date: Diabetes mellitus without complication (HCC) No date: GERD (gastroesophageal reflux disease) No date: Hyperlipidemia No date: Hypertension No date: Impaired fasting glucose No date: Neuromuscular disorder (HCC) No date: Plantar fasciitis No date: Syncope    Reproductive/Obstetrics                             Anesthesia Physical Anesthesia Plan  ASA: III  Anesthesia Plan: General   Post-op Pain Management:    Induction: Intravenous  PONV Risk Score and Plan: 1 and Propofol  Airway Management Planned: Natural Airway  Additional Equipment:    Intra-op Plan:   Post-operative Plan:   Informed Consent: I have reviewed the patients History and Physical, chart, labs and discussed the procedure including the risks, benefits and alternatives for the proposed anesthesia with the patient or authorized representative who has indicated his/her understanding and acceptance.   Dental advisory given  Plan Discussed with: CRNA and Anesthesiologist  Anesthesia Plan Comments:        Anesthesia Quick Evaluation

## 2017-01-14 NOTE — H&P (Signed)
[name] MD [address] [place], [place] [address] Phone: [phone] Fax : [phone]  Primary Care Physician:  Valerie Roys, DO Primary Gastroenterologist:  Dr. [name]   Pre-Procedure History & Physical: HPI:  Nathan Fields is a 66 y.o. male is here for an colonoscopy.   Past Medical History:  Diagnosis Date  . Arthritis    right foot  . CAD (coronary artery disease)   . Chronic low back pain   . Diabetes mellitus without complication ([place])   . Diabetes mellitus without complication ([place])   . GERD (gastroesophageal reflux disease)   . Hyperlipidemia   . Hypertension   . Impaired fasting glucose   . Neuromuscular disorder ([place])   . Plantar fasciitis   . Syncope     Past Surgical History:  Procedure Laterality Date  . CARDIAC CATHETERIZATION    . COLONOSCOPY  2011   Polyp removed, follow up in 5 years    Prior to Admission medications   Medication Sig Start Date End Date Taking? Authorizing Provider  QUEtiapine (SEROQUEL) 25 MG tablet  11/15/16  Yes [provider]  aspirin 81 MG tablet Take 81 mg by mouth daily.    [provider]  Cholecalciferol (VITAMIN D3) 3000 units TABS Take by mouth.    [provider]  Cyanocobalamin (VITAMIN B 12 PO) Take by mouth.    [provider]  lisinopril (PRINIVIL,ZESTRIL) 30 MG tablet Take 1 tablet (30 mg total) by mouth daily. 09/23/16   Park Liter P, DO  niacin (NIASPAN) 500 MG CR tablet  11/15/16   [provider]  sildenafil (VIAGRA) 100 MG tablet Take 0.5-1 tablets (50-100 mg total) by mouth daily as needed for erectile dysfunction. 10/08/16   Park Liter P, DO    Allergies as of 12/17/2016 - Review Complete 12/17/2016  Allergen Reaction Noted  . Statins Other (See Comments) 02/05/2016  . Zetia [ezetimibe] Other (See Comments) 02/05/2016  . Niacin and related Rash 11/11/2016    Family History  Problem Relation Age of Onset  . Heart disease Mother    . Hyperlipidemia Mother   . Diabetes Mother   . Congestive Heart Failure Mother   . Diabetes Father   . Heart attack Father   . Hyperlipidemia Father     Social History   Social History  . Marital status: Married    Spouse name: N/A  . Number of children: 2  . Years of education: 9th grade   Occupational History  . Not on file.   Social History Main Topics  . Smoking status: Never Smoker  . Smokeless tobacco: Never Used  . Alcohol use 0.0 oz/week     Comment: on rare occasion  . Drug use: No  . Sexual activity: Not Currently   Other Topics Concern  . Not on file   Social History Narrative  . No narrative on file    Review of Systems: See HPI, otherwise negative ROS  Physical Exam: BP 131/73   Pulse 62   Temp 98.3 F (36.8 C) (Oral)   Resp 18   Ht 5\' 6"  (1.676 m)   Wt 170 lb (77.1 kg)   SpO2 99%   BMI 27.44 kg/m  General:   Alert,  pleasant and cooperative in NAD Head:  Normocephalic and atraumatic. Neck:  Supple; no masses or thyromegaly. Lungs:  Clear throughout to auscultation.    Heart:  Regular rate and rhythm. Abdomen:  Soft, nontender and nondistended. Normal  bowel sounds, without guarding, and without rebound.   Neurologic:  Alert and  oriented x4;  grossly normal neurologically.  Impression/Plan: Nathan Fields is here for an colonoscopy to be performed for rectal bleeding   Risks, benefits, limitations, and alternatives regarding  colonoscopy have been reviewed with the patient.  Questions have been answered.  All parties agreeable.   [name], MD  01/14/2017, 10:32 AM

## 2017-01-14 NOTE — Transfer of Care (Signed)
Immediate Anesthesia Transfer of Care Note  Patient: Nathan Fields  Procedure(s) Performed: Procedure(s): COLONOSCOPY WITH PROPOFOL (N/A)  Patient Location: Endoscopy Unit  Anesthesia Type:General  Level of Consciousness: awake and alert   Airway & Oxygen Therapy: Patient Spontanous Breathing and Patient connected to nasal cannula oxygen  Post-op Assessment: Report given to RN and Post -op Vital signs reviewed and stable  Post vital signs: Reviewed and stable  Last Vitals:  Vitals:   01/14/17 0955 01/14/17 1103  BP: 131/73 96/66  Pulse: 62 67  Resp: 18   Temp: 36.8 C (!) 35.6 C    Last Pain:  Vitals:   01/14/17 1103  TempSrc: Tympanic         Complications: No apparent anesthesia complications

## 2017-01-14 NOTE — Anesthesia Post-op Follow-up Note (Signed)
Anesthesia QCDR form completed.        

## 2017-01-15 ENCOUNTER — Encounter: Payer: Self-pay | Admitting: Gastroenterology

## 2017-01-15 LAB — SURGICAL PATHOLOGY

## 2017-01-15 NOTE — Anesthesia Postprocedure Evaluation (Signed)
Anesthesia Post Note  Patient: Nathan Fields  Procedure(s) Performed: Procedure(s) (LRB): COLONOSCOPY WITH PROPOFOL (N/A)  Patient location during evaluation: Endoscopy Anesthesia Type: General Level of consciousness: awake and alert and oriented Pain management: pain level controlled Vital Signs Assessment: post-procedure vital signs reviewed and stable Respiratory status: spontaneous breathing, nonlabored ventilation and respiratory function stable Cardiovascular status: blood pressure returned to baseline and stable Postop Assessment: no signs of nausea or vomiting Anesthetic complications: no     Last Vitals:  Vitals:   01/14/17 1123 01/14/17 1133  BP: 128/71 (!) 148/75  Pulse: 63 (!) 57  Resp: 17 12  Temp:      Last Pain:  Vitals:   01/14/17 1103  TempSrc: Tympanic                 Toan Mort

## 2017-01-19 ENCOUNTER — Encounter: Payer: Self-pay | Admitting: Gastroenterology

## 2017-02-10 ENCOUNTER — Telehealth: Payer: Self-pay

## 2017-02-10 ENCOUNTER — Encounter: Payer: Self-pay | Admitting: Family Medicine

## 2017-02-10 ENCOUNTER — Ambulatory Visit (INDEPENDENT_AMBULATORY_CARE_PROVIDER_SITE_OTHER): Payer: Medicare Other | Admitting: Family Medicine

## 2017-02-10 VITALS — BP 123/78 | HR 54 | Temp 98.4°F | Wt 165.2 lb

## 2017-02-10 DIAGNOSIS — K625 Hemorrhage of anus and rectum: Secondary | ICD-10-CM

## 2017-02-10 DIAGNOSIS — R1084 Generalized abdominal pain: Secondary | ICD-10-CM | POA: Diagnosis not present

## 2017-02-10 DIAGNOSIS — R1011 Right upper quadrant pain: Secondary | ICD-10-CM | POA: Diagnosis not present

## 2017-02-10 LAB — UA/M W/RFLX CULTURE, ROUTINE
BILIRUBIN UA: NEGATIVE
Glucose, UA: NEGATIVE
KETONES UA: NEGATIVE
Leukocytes, UA: NEGATIVE
NITRITE UA: NEGATIVE
PH UA: 6 (ref 5.0–7.5)
PROTEIN UA: NEGATIVE
RBC UA: NEGATIVE
SPEC GRAV UA: 1.015 (ref 1.005–1.030)
Urobilinogen, Ur: 0.2 mg/dL (ref 0.2–1.0)

## 2017-02-10 MED ORDER — HYDROCORTISONE 2.5 % RE CREA: 1.0000 "application " | TOPICAL_CREAM | Freq: Two times a day (BID) | RECTAL | 0 refills | Status: DC

## 2017-02-10 MED ORDER — LOSARTAN POTASSIUM 50 MG PO TABS: 75.0000 mg | ORAL_TABLET | Freq: Every day | ORAL | 1 refills | Status: DC

## 2017-02-10 MED ORDER — POLYETHYLENE GLYCOL 3350 17 GM/SCOOP PO POWD: 17.0000 g | Freq: Two times a day (BID) | ORAL | 1 refills | Status: DC | PRN

## 2017-02-10 MED ORDER — SENNOSIDES-DOCUSATE SODIUM 8.6-50 MG PO TABS: 1.0000 | ORAL_TABLET | Freq: Every day | ORAL | 12 refills | Status: DC

## 2017-02-10 NOTE — Telephone Encounter (Signed)
Called patient to notify him of his [place] appointment. Thursday 02/13/17 at 10:00 (arrive at 9:45). NPO after midnight. [facility] entranace.   No answer, LVM for patient to return my call.

## 2017-02-10 NOTE — Patient Instructions (Addendum)
03/19/17 @ 1:45 with [facility] - [place] [address] [place], [place] [address]  Main: [phone]  Hemorrhoids Hemorrhoids are swollen veins in and around the rectum or anus. Hemorrhoids can cause pain, itching, or bleeding. Most of the time, they do not cause serious problems. They usually get better with diet changes, lifestyle changes, and other home treatments. Follow these instructions at home: Eating and drinking  Eat foods that have fiber, such as whole grains, beans, nuts, fruits, and vegetables. Ask your doctor about taking products that have added fiber (fibersupplements).  Drink enough fluid to keep your pee (urine) clear or pale yellow. For Pain and Swelling  Take a warm-water bath (sitz bath) for 20 minutes to ease pain. Do this 3-4 times a day.  If directed, put ice on the painful area. It may be helpful to use ice between your warm baths. ? Put ice in a plastic bag. ? Place a towel between your skin and the bag. ? Leave the ice on for 20 minutes, 2-3 times a day. General instructions  Take over-the-counter and prescription medicines only as told by your doctor. ? Medicated creams and medicines that are inserted into the anus (suppositories) may be used or applied as told.  Exercise often.  Go to the bathroom when you have the urge to poop (to have a bowel movement). Do not wait.  Avoid pushing too hard (straining) when you poop.  Keep the butt area dry and clean. Use wet toilet paper or moist paper towels.  Do not sit on the toilet for a long time. Contact a doctor if:  You have any of these: ? Pain and swelling that do not get better with treatment or medicine. ? Bleeding that will not stop. ? Trouble pooping or you cannot poop. ? Pain or swelling outside the area of the hemorrhoids. This information is not intended to replace advice given to you by your health care provider. Make sure you discuss any questions you  have with your health care provider. Document Released: 03/26/2008 Document Revised: 11/23/2015 Document Reviewed: 03/01/2015 Elsevier Interactive Patient Education  [employer].

## 2017-02-10 NOTE — Progress Notes (Signed)
BP 123/78 (BP Location: Left Arm, Patient Position: Sitting, Cuff Size: Normal)   Pulse (!) 54   Temp 98.4 F (36.9 C)   Wt 165 lb 4 oz (75 kg)   SpO2 97%   BMI 26.67 kg/m    Subjective:    Patient ID: Nathan Fields, male    DOB: 06/23/1951, 66 y.o.   MRN: [id number]  HPI: Nathan Fields is a 66 y.o. male  Chief Complaint  Patient presents with  . Abdominal Pain    Patient states that he had his colonoscopy on January 14, 2017, they removed some polyps. Patient states that the pain is worse than it was  . Rectal Bleeding  . Back Pain   RECTAL BLEEDING- had a colonoscopy on 7/17. Got better for the 1st week, then started to come back. Notes that they found hemorrhoids. He's been pretty constipated Duration: getting worse for the past month Bright red rectal bleeding: yes  Amount of blood: moderate- soaking his underware  Frequency: constant Melena: no  Spotting on toilet tissue: More than that  Anal fullness: yes  Perianal pain: yes  Severity: severe Perianal irritation/itching: no  Constipation: yes  Chronic straining/valsava:  no  Anal trauma/intercourse: no  Hemorrhoids: yes  Previous colonoscopy: yes   ABDOMINAL PAIN  Duration: 3-4 months Onset: sudden Severity: severe Quality: cramping, muscle spasms Location:  Mainly R side, but diffuse  Episode duration: at least a couple of hous Radiation: yes- low back Frequency: waxes and wanes- but constant Alleviating factors: nothing Aggravating factors: eating something Status: worse Treatments attempted: antacids Fever: no Nausea: no Vomiting: no Weight loss: yes Decreased appetite: no Diarrhea: yes Constipation: yes Blood in stool: yes Heartburn: no Jaundice: no Rash: no Dysuria/urinary frequency: no Hematuria: no History of sexually transmitted disease: no Recurrent NSAID use: no  Back has been acting up again for about 2-5 weeks. He thinks that the pain in his back may be causing some of the R  side pain  Relevant past medical, surgical, family and social history reviewed and updated as indicated. Interim medical history since our last visit reviewed. Allergies and medications reviewed and updated.  Review of Systems  Constitutional: Negative.   Respiratory: Negative.   Cardiovascular: Negative.   Gastrointestinal: Positive for abdominal pain, anal bleeding, blood in stool and constipation. Negative for abdominal distention, diarrhea, nausea, rectal pain and vomiting.  Genitourinary: Negative.   Musculoskeletal: Positive for arthralgias, back pain and myalgias. Negative for gait problem, joint swelling, neck pain and neck stiffness.  Psychiatric/Behavioral: Negative.     Per HPI unless specifically indicated above     Objective:    BP 123/78 (BP Location: Left Arm, Patient Position: Sitting, Cuff Size: Normal)   Pulse (!) 54   Temp 98.4 F (36.9 C)   Wt 165 lb 4 oz (75 kg)   SpO2 97%   BMI 26.67 kg/m   Wt Readings from Last 3 Encounters:  02/10/17 165 lb 4 oz (75 kg)  01/14/17 170 lb (77.1 kg)  12/17/16 162 lb 12.8 oz (73.8 kg)    Physical Exam  Constitutional: He is oriented to person, place, and time. He appears well-developed and well-nourished. No distress.  HENT:  Head: Normocephalic and atraumatic.  Right Ear: Hearing normal.  Left Ear: Hearing normal.  Nose: Nose normal.  Eyes: Conjunctivae and lids are normal. Right eye exhibits no discharge. Left eye exhibits no discharge. No scleral icterus.  Cardiovascular: Normal rate, regular rhythm, normal heart sounds and  intact distal pulses.  Exam reveals no gallop and no friction rub.   No murmur heard. Pulmonary/Chest: Effort normal and breath sounds normal. No respiratory distress. He has no wheezes. He has no rales. He exhibits no tenderness.  Abdominal: Soft. Bowel sounds are normal. He exhibits no distension and no mass. There is tenderness in the right upper quadrant. There is no rebound and no guarding.    Musculoskeletal: Normal range of motion.  Neurological: He is alert and oriented to person, place, and time.  Skin: Skin is warm, dry and intact. No rash noted. He is not diaphoretic. No erythema. No pallor.  Psychiatric: He has a normal mood and affect. His speech is normal and behavior is normal. Judgment and thought content normal. Cognition and memory are normal.  Nursing note and vitals reviewed.     Assessment & Plan:   Problem List Items Addressed This Visit    None    Visit Diagnoses    Rectal bleeding    -  Primary   Likely due to hemorrhoids. Will start stool softener and miralax and anusol. Recheck 2-3 weeks. If not improving, will send to surgery.   Relevant Orders   CBC with Differential/Platelet   RUQ pain       Will check RUQ [place]- await results. ? referral from back or constipation. UA negative. Will await results and treat constipation. Call with any concerns.    Relevant Orders   US Abdomen Limited RUQ   Generalized abdominal pain       Checking RUQ [place] and treating constipation. Getting back into see GI in September. Call with any concerns.    Relevant Orders   UA/M w/rflx Culture, Routine       Follow up plan: Return 2-3 weeks, for follow up rectal bleeding and abdominal pain.

## 2017-02-11 ENCOUNTER — Other Ambulatory Visit: Payer: Self-pay | Admitting: Family Medicine

## 2017-02-11 LAB — CBC WITH DIFFERENTIAL/PLATELET
Basophils Absolute: 0 10*3/uL (ref 0.0–0.2)
Basos: 1 %
EOS (ABSOLUTE): 0.1 10*3/uL (ref 0.0–0.4)
EOS: 2 %
Hematocrit: 44.5 % (ref 37.5–51.0)
Hemoglobin: 14.7 g/dL (ref 13.0–17.7)
Immature Grans (Abs): 0 10*3/uL (ref 0.0–0.1)
Immature Granulocytes: 0 %
LYMPHS ABS: 2.1 10*3/uL (ref 0.7–3.1)
Lymphs: 39 %
MCH: 30.1 pg (ref 26.6–33.0)
MCHC: 33 g/dL (ref 31.5–35.7)
MCV: 91 fL (ref 79–97)
MONOS ABS: 0.5 10*3/uL (ref 0.1–0.9)
Monocytes: 9 %
NEUTROS PCT: 49 %
Neutrophils Absolute: 2.6 10*3/uL (ref 1.4–7.0)
PLATELETS: 259 10*3/uL (ref 150–379)
RBC: 4.89 x10E6/uL (ref 4.14–5.80)
RDW: 13.4 % (ref 12.3–15.4)
WBC: 5.4 10*3/uL (ref 3.4–10.8)

## 2017-02-13 ENCOUNTER — Ambulatory Visit
Admission: RE | Admit: 2017-02-13 | Discharge: 2017-02-13 | Disposition: A | Discharge status: Home / Self Care | Payer: Medicare Other | Source: Ambulatory Visit | Attending: Family Medicine | Admitting: Family Medicine

## 2017-02-13 DIAGNOSIS — K769 Liver disease, unspecified: Secondary | ICD-10-CM | POA: Insufficient documentation

## 2017-02-13 DIAGNOSIS — R1011 Right upper quadrant pain: Secondary | ICD-10-CM | POA: Diagnosis not present

## 2017-02-14 ENCOUNTER — Telehealth: Payer: Self-pay | Admitting: Family Medicine

## 2017-02-14 NOTE — Telephone Encounter (Signed)
Called and let patient know what Nathan Fields said about ultrasound results.

## 2017-02-14 NOTE — Telephone Encounter (Signed)
Routing to provider for results.

## 2017-02-14 NOTE — Telephone Encounter (Signed)
Please let them know there were no gallstones.  The liver is 'fatty" which is common as we get olver and gain weight.  But, his liver functions are normal and it is not the cause of his pain

## 2017-03-19 ENCOUNTER — Ambulatory Visit: Payer: Medicare Other | Admitting: Gastroenterology

## 2017-03-25 ENCOUNTER — Ambulatory Visit: Payer: Medicare Other | Admitting: Family Medicine

## 2017-08-08 ENCOUNTER — Encounter: Payer: Self-pay | Admitting: Family Medicine

## 2017-08-08 ENCOUNTER — Ambulatory Visit (INDEPENDENT_AMBULATORY_CARE_PROVIDER_SITE_OTHER): Payer: Medicare Other | Admitting: Family Medicine

## 2017-08-08 VITALS — BP 173/83 | HR 69 | Wt 171.4 lb

## 2017-08-08 DIAGNOSIS — I10 Essential (primary) hypertension: Secondary | ICD-10-CM | POA: Diagnosis not present

## 2017-08-08 DIAGNOSIS — R079 Chest pain, unspecified: Secondary | ICD-10-CM

## 2017-08-08 DIAGNOSIS — E782 Mixed hyperlipidemia: Secondary | ICD-10-CM | POA: Diagnosis not present

## 2017-08-08 DIAGNOSIS — E538 Deficiency of other specified B group vitamins: Secondary | ICD-10-CM | POA: Diagnosis not present

## 2017-08-08 DIAGNOSIS — R3129 Other microscopic hematuria: Secondary | ICD-10-CM | POA: Diagnosis not present

## 2017-08-08 DIAGNOSIS — R7301 Impaired fasting glucose: Secondary | ICD-10-CM | POA: Diagnosis not present

## 2017-08-08 DIAGNOSIS — I2510 Atherosclerotic heart disease of native coronary artery without angina pectoris: Secondary | ICD-10-CM

## 2017-08-08 DIAGNOSIS — Z125 Encounter for screening for malignant neoplasm of prostate: Secondary | ICD-10-CM

## 2017-08-08 LAB — UA/M W/RFLX CULTURE, ROUTINE
BILIRUBIN UA: NEGATIVE
GLUCOSE, UA: NEGATIVE
Ketones, UA: NEGATIVE
LEUKOCYTES UA: NEGATIVE
Nitrite, UA: NEGATIVE
PROTEIN UA: NEGATIVE
RBC UA: NEGATIVE
SPEC GRAV UA: 1.02 (ref 1.005–1.030)
Urobilinogen, Ur: 0.2 mg/dL (ref 0.2–1.0)
pH, UA: 7 (ref 5.0–7.5)

## 2017-08-08 LAB — MICROALBUMIN, URINE WAIVED
CREATININE, URINE WAIVED: 200 mg/dL (ref 10–300)
MICROALB, UR WAIVED: 30 mg/L — AB (ref 0–19)
Microalb/Creat Ratio: <30 mg/g (ref <30)

## 2017-08-08 MED ORDER — VALSARTAN 80 MG PO TABS: 80.0000 mg | ORAL_TABLET | Freq: Every day | ORAL | 1 refills | Status: DC

## 2017-08-08 NOTE — Assessment & Plan Note (Signed)
Rechecking levels today. Await results. Continue diet and exercise. Call with any concerns.

## 2017-08-08 NOTE — Assessment & Plan Note (Signed)
Not under good control. Off his losartan. Will change to valsartan and recheck in 2-3 weeks.

## 2017-08-08 NOTE — Assessment & Plan Note (Signed)
Will keep BP and cholesterol under control, given symptoms, sounds very much like GERD. Warning signs for which he should go to the ER discussed.

## 2017-08-08 NOTE — Progress Notes (Signed)
BP (!) 173/83 (BP Location: Left Arm, Patient Position: Sitting, Cuff Size: Normal)   Pulse 69   Wt 171 lb 6 oz (77.7 kg)   SpO2 97%   BMI 27.66 kg/m    Subjective:    Patient ID: Nathan Fields, male    DOB: 1951/03/19, 67 y.o.   MRN: [id number]  HPI: Nathan Fields is a 67 y.o. male  Chief Complaint  Patient presents with  . Gastroesophageal Reflux   CHEST PAIN- has been eating a lot more junk since Christmas, has been taking baking soda and seems to be feeling better since then Time since onset: off and on for about a month Duration:weeks Onset: sudden Quality: dull and aching Severity: severe Location: left para substernal Radiation: left arm Episode duration: hours Frequency: intermittent Related to exertion: no Activity when pain started: with eating Trauma: no Anxiety/recent stressors: yes Aggravating factors: eating Alleviating factors: baking soda Status: better Treatments attempted:baking soda   Current pain status: pain free Shortness of breath: no Cough: yes, non-productive Nausea: no Diaphoresis: no Heartburn: yes Palpitations: no   HYPERTENSION- hasn't taken his losartan for about 5 days Hypertension status: uncontrolled  Satisfied with current treatment? no Duration of hypertension: chronic BP monitoring frequency:  not checking BP range:  BP medication side effects:  no Medication compliance: poor compliance Previous BP meds: losartan Aspirin: yes Recurrent headaches: no Visual changes: no Palpitations: no Dyspnea: no Chest pain: yes Lower extremity edema: no Dizzy/lightheaded: no   Impaired Fasting Glucose HbA1C:  Lab Results  Component Value Date   HGBA1C 6.3 11/21/2015   Duration of elevated blood sugar: chronic Polydipsia: no Polyuria: no Weight change: no Visual disturbance: no Glucose Monitoring: no Diabetic Education: Completed Family history of diabetes: yes   Relevant past medical, surgical, family and social  history reviewed and updated as indicated. Interim medical history since our last visit reviewed. Allergies and medications reviewed and updated.  Review of Systems  Constitutional: Negative.   Cardiovascular: Positive for chest pain. Negative for palpitations and leg swelling.  Gastrointestinal: Positive for abdominal pain. Negative for abdominal distention, anal bleeding, blood in stool, constipation, diarrhea, nausea, rectal pain and vomiting.  Neurological: Negative.   Psychiatric/Behavioral: Negative.     Per HPI unless specifically indicated above     Objective:    BP (!) 173/83 (BP Location: Left Arm, Patient Position: Sitting, Cuff Size: Normal)   Pulse 69   Wt 171 lb 6 oz (77.7 kg)   SpO2 97%   BMI 27.66 kg/m   Wt Readings from Last 3 Encounters:  08/08/17 171 lb 6 oz (77.7 kg)  02/10/17 165 lb 4 oz (75 kg)  01/14/17 170 lb (77.1 kg)    Physical Exam  Constitutional: He is oriented to person, place, and time. He appears well-developed and well-nourished. No distress.  HENT:  Head: Normocephalic and atraumatic.  Right Ear: Hearing normal.  Left Ear: Hearing normal.  Nose: Nose normal.  Eyes: Conjunctivae and lids are normal. Right eye exhibits no discharge. Left eye exhibits no discharge. No scleral icterus.  Cardiovascular: Normal rate, regular rhythm, normal heart sounds and intact distal pulses. Exam reveals no gallop and no friction rub.  No murmur heard. Pulmonary/Chest: Effort normal and breath sounds normal. No respiratory distress. He has no wheezes. He has no rales. He exhibits no tenderness.  Abdominal: Soft. Bowel sounds are normal. He exhibits no distension and no mass. There is no tenderness. There is no rebound and no guarding.  Musculoskeletal: Normal range of motion.  Neurological: He is alert and oriented to person, place, and time.  Skin: Skin is warm, dry and intact. No rash noted. He is not diaphoretic. No erythema. No pallor.  Psychiatric: He has  a normal mood and affect. His speech is normal and behavior is normal. Judgment and thought content normal. Cognition and memory are normal.  Nursing note and vitals reviewed.   Results for orders placed or performed in visit on 02/10/17  CBC with Differential/Platelet  Result Value Ref Range   WBC 5.4 3.4 - 10.8 x10E3/uL   RBC 4.89 4.14 - 5.80 x10E6/uL   Hemoglobin 14.7 13.0 - 17.7 g/dL   Hematocrit 44.5 37.5 - 51.0 %   MCV 91 79 - 97 fL   MCH 30.1 26.6 - 33.0 pg   MCHC 33.0 31.5 - 35.7 g/dL   RDW 13.4 12.3 - 15.4 %   Platelets 259 150 - 379 x10E3/uL   Neutrophils 49 Not Estab. %   Lymphs 39 Not Estab. %   Monocytes 9 Not Estab. %   Eos 2 Not Estab. %   Basos 1 Not Estab. %   Neutrophils Absolute 2.6 1.4 - 7.0 x10E3/uL   Lymphocytes Absolute 2.1 0.7 - 3.1 x10E3/uL   Monocytes Absolute 0.5 0.1 - 0.9 x10E3/uL   EOS (ABSOLUTE) 0.1 0.0 - 0.4 x10E3/uL   Basophils Absolute 0.0 0.0 - 0.2 x10E3/uL   Immature Granulocytes 0 Not Estab. %   Immature Grans (Abs) 0.0 0.0 - 0.1 x10E3/uL  UA/M w/rflx Culture, Routine  Result Value Ref Range   Specific Gravity, UA 1.015 1.005 - 1.030   pH, UA 6.0 5.0 - 7.5   Color, UA Yellow Yellow   Appearance Ur Clear Clear   Leukocytes, UA Negative Negative   Protein, UA Negative Negative/Trace   Glucose, UA Negative Negative   Ketones, UA Negative Negative   RBC, UA Negative Negative   Bilirubin, UA Negative Negative   Urobilinogen, Ur 0.2 0.2 - 1.0 mg/dL   Nitrite, UA Negative Negative      Assessment & Plan:   Problem List Items Addressed This Visit      Cardiovascular and Mediastinum   CAD (coronary artery disease)    Will keep BP and cholesterol under control, given symptoms, sounds very much like GERD. Warning signs for which he should go to the ER discussed.       Relevant Medications   valsartan (DIOVAN) 80 MG tablet   Other Relevant Orders   Comprehensive metabolic panel   CBC with Differential/Platelet   TSH   Hypertension     Not under good control. Off his losartan. Will change to valsartan and recheck in 2-3 weeks.       Relevant Medications   valsartan (DIOVAN) 80 MG tablet   Other Relevant Orders   Comprehensive metabolic panel   CBC with Differential/Platelet   Microalbumin, Urine Waived   TSH     Endocrine   Impaired fasting glucose    Rechecking levels today. Await results. Continue diet and exercise. Call with any concerns.       Relevant Orders   Comprehensive metabolic panel   CBC with Differential/Platelet   Bayer DCA Hb A1c Waived   Microalbumin, Urine Waived   TSH     Other   Hyperlipidemia    Rechecking levels today. Await results. Call with any concerns.       Relevant Medications   valsartan (DIOVAN) 80 MG tablet   Other  Relevant Orders   Comprehensive metabolic panel   CBC with Differential/Platelet   Lipid Panel w/o Chol/HDL Ratio   TSH   B12 deficiency    Checking CBC today. Await results. Continue OTC supplementation.       Relevant Orders   Comprehensive metabolic panel   CBC with Differential/Platelet   TSH   Hematuria    Rechecking levels today. Await results. Call with any concerns.       Relevant Orders   Comprehensive metabolic panel   CBC with Differential/Platelet   TSH   UA/M w/rflx Culture, Routine    Other Visit Diagnoses    Chest pain, unspecified type    -  Primary   Likely GERD. Warning signs discussed. Given CAD, low threshold to send back to cardiology. Recheck 2 weeks.    Relevant Orders   EKG 12-Lead (Completed)   Comprehensive metabolic panel   CBC with Differential/Platelet   TSH   Screening for prostate cancer       Labs drawn today. Await results.    Relevant Orders   PSA       Follow up plan: Return 2-3 weeks, for follow up BP.

## 2017-08-08 NOTE — Assessment & Plan Note (Signed)
Rechecking levels today. Await results. Call with any concerns.  

## 2017-08-08 NOTE — Assessment & Plan Note (Signed)
Checking CBC today. Await results. Continue OTC supplementation.

## 2017-08-09 LAB — COMPREHENSIVE METABOLIC PANEL
A/G RATIO: 1.8 (ref 1.2–2.2)
ALBUMIN: 4.5 g/dL (ref 3.6–4.8)
ALK PHOS: 95 IU/L (ref 39–117)
ALT: 20 IU/L (ref 0–44)
AST: 20 IU/L (ref 0–40)
BILIRUBIN TOTAL: 0.3 mg/dL (ref 0.0–1.2)
BUN / CREAT RATIO: 14 (ref 10–24)
BUN: 15 mg/dL (ref 8–27)
CHLORIDE: 102 mmol/L (ref 96–106)
CO2: 23 mmol/L (ref 20–29)
Calcium: 9.8 mg/dL (ref 8.6–10.2)
Creatinine, Ser: 1.11 mg/dL (ref 0.76–1.27)
GFR calc Af Amer: 80 mL/min/{1.73_m2} (ref >59)
GFR calc non Af Amer: 69 mL/min/{1.73_m2} (ref >59)
GLOBULIN, TOTAL: 2.5 g/dL (ref 1.5–4.5)
Glucose: 111 mg/dL — ABNORMAL HIGH (ref 65–99)
Potassium: 4.3 mmol/L (ref 3.5–5.2)
SODIUM: 142 mmol/L (ref 134–144)
Total Protein: 7 g/dL (ref 6.0–8.5)

## 2017-08-09 LAB — LIPID PANEL W/O CHOL/HDL RATIO
CHOLESTEROL TOTAL: 244 mg/dL — AB (ref 100–199)
HDL: 43 mg/dL (ref >39)
LDL Calculated: 172 mg/dL — ABNORMAL HIGH (ref 0–99)
Triglycerides: 146 mg/dL (ref 0–149)
VLDL Cholesterol Cal: 29 mg/dL (ref 5–40)

## 2017-08-09 LAB — CBC WITH DIFFERENTIAL/PLATELET
BASOS ABS: 0.1 10*3/uL (ref 0.0–0.2)
Basos: 1 %
EOS (ABSOLUTE): 0.1 10*3/uL (ref 0.0–0.4)
Eos: 2 %
HEMATOCRIT: 44 % (ref 37.5–51.0)
Hemoglobin: 14.1 g/dL (ref 13.0–17.7)
Immature Grans (Abs): 0 10*3/uL (ref 0.0–0.1)
Immature Granulocytes: 0 %
LYMPHS ABS: 2 10*3/uL (ref 0.7–3.1)
Lymphs: 38 %
MCH: 29.1 pg (ref 26.6–33.0)
MCHC: 32 g/dL (ref 31.5–35.7)
MCV: 91 fL (ref 79–97)
MONOS ABS: 0.4 10*3/uL (ref 0.1–0.9)
Monocytes: 7 %
Neutrophils Absolute: 2.8 10*3/uL (ref 1.4–7.0)
Neutrophils: 52 %
Platelets: 258 10*3/uL (ref 150–379)
RBC: 4.84 x10E6/uL (ref 4.14–5.80)
RDW: 13.6 % (ref 12.3–15.4)
WBC: 5.3 10*3/uL (ref 3.4–10.8)

## 2017-08-09 LAB — PSA: PROSTATE SPECIFIC AG, SERUM: 1.6 ng/mL (ref 0.0–4.0)

## 2017-08-09 LAB — TSH: TSH: 1.87 u[IU]/mL (ref 0.450–4.500)

## 2017-08-22 ENCOUNTER — Encounter: Payer: Self-pay | Admitting: Family Medicine

## 2017-08-22 ENCOUNTER — Emergency Department
Admission: EM | Admit: 2017-08-22 | Discharge: 2017-08-22 | Discharge status: Against Medical Advice | Payer: Medicare Other | Attending: Emergency Medicine | Admitting: Emergency Medicine

## 2017-08-22 ENCOUNTER — Emergency Department: Payer: Medicare Other

## 2017-08-22 ENCOUNTER — Ambulatory Visit (INDEPENDENT_AMBULATORY_CARE_PROVIDER_SITE_OTHER): Payer: Medicare Other | Admitting: Family Medicine

## 2017-08-22 ENCOUNTER — Encounter: Payer: Self-pay | Admitting: Emergency Medicine

## 2017-08-22 VITALS — BP 181/93 | HR 51 | Temp 98.3°F | Wt 178.1 lb

## 2017-08-22 DIAGNOSIS — I2510 Atherosclerotic heart disease of native coronary artery without angina pectoris: Secondary | ICD-10-CM | POA: Diagnosis not present

## 2017-08-22 DIAGNOSIS — I10 Essential (primary) hypertension: Secondary | ICD-10-CM

## 2017-08-22 DIAGNOSIS — R55 Syncope and collapse: Secondary | ICD-10-CM | POA: Insufficient documentation

## 2017-08-22 DIAGNOSIS — Z5321 Procedure and treatment not carried out due to patient leaving prior to being seen by health care provider: Secondary | ICD-10-CM | POA: Insufficient documentation

## 2017-08-22 DIAGNOSIS — R61 Generalized hyperhidrosis: Secondary | ICD-10-CM

## 2017-08-22 DIAGNOSIS — R079 Chest pain, unspecified: Secondary | ICD-10-CM

## 2017-08-22 LAB — CBC
HEMATOCRIT: 43.8 % (ref 40.0–52.0)
HEMOGLOBIN: 14.2 g/dL (ref 13.0–18.0)
MCH: 29.4 pg (ref 26.0–34.0)
MCHC: 32.4 g/dL (ref 32.0–36.0)
MCV: 90.5 fL (ref 80.0–100.0)
Platelets: 235 10*3/uL (ref 150–440)
RBC: 4.84 MIL/uL (ref 4.40–5.90)
RDW: 14.1 % (ref 11.5–14.5)
WBC: 5.7 10*3/uL (ref 3.8–10.6)

## 2017-08-22 LAB — BASIC METABOLIC PANEL
ANION GAP: 8 (ref 5–15)
BUN: 23 mg/dL — ABNORMAL HIGH (ref 6–20)
CO2: 23 mmol/L (ref 22–32)
Calcium: 9.1 mg/dL (ref 8.9–10.3)
Chloride: 107 mmol/L (ref 101–111)
Creatinine, Ser: 1.05 mg/dL (ref 0.61–1.24)
GFR calc Af Amer: >60 mL/min (ref >60)
Glucose, Bld: 162 mg/dL — ABNORMAL HIGH (ref 65–99)
POTASSIUM: 4.3 mmol/L (ref 3.5–5.1)
Sodium: 138 mmol/L (ref 135–145)

## 2017-08-22 LAB — TROPONIN I

## 2017-08-22 MED ORDER — AMLODIPINE BESYLATE 10 MG PO TABS: 10.0000 mg | ORAL_TABLET | Freq: Every day | ORAL | 3 refills | Status: DC

## 2017-08-22 NOTE — ED Notes (Signed)
Called patient in main ED waiting room.  No answer.

## 2017-08-22 NOTE — Assessment & Plan Note (Addendum)
Not under good control. Will add amlodipine and recheck in 2 weeks. Getting him into see cardiology. To see them on Tuesday. However, given his other symptoms right now, he is to go to the ER for further evaluation. Labs held as he is going to ER.

## 2017-08-22 NOTE — Patient Instructions (Signed)
YOU HAVE AN APPOINTMENT WITH CARDIOLOGY  08/26/17 11AM  Dr. Clayborn Bigness [address], [place] [address] Phone: [phone]

## 2017-08-22 NOTE — ED Triage Notes (Signed)
Pt comes into the ED via POV c/o a couple of episodes of near syncope.  EKG was completed at this doctors office and the wanted him sent here.  Patient states that he has had two episodes this week where he has gotten pale and diaphoretic and feels extremely weak like he is going to pass out.  Patient has even and unlabored respirations at this time.  Patient has mild chest "heaviness" and shortness of breath but denies any nausea.  Patient states that last week he had left chest pain that radiated into the left arm.

## 2017-08-22 NOTE — ED Notes (Signed)
Called in waiting room x 3.  Looked for patient in main waiting and bathroom.  No response.

## 2017-08-22 NOTE — Progress Notes (Signed)
BP (!) 181/93 (BP Location: Left Arm, Patient Position: Sitting, Cuff Size: Large)   Pulse (!) 51   Temp 98.3 F (36.8 C)   Wt 178 lb 1 oz (80.8 kg)   SpO2 98%   BMI 28.74 kg/m    Subjective:    Patient ID: Nathan Fields, male    DOB: 11-05-1950, 67 y.o.   MRN: [id number]  HPI: TEE RICHESON is a 67 y.o. male  Chief Complaint  Patient presents with  . Hypertension    Patient states that he doesnt feel well   Nathan Fields presents today with his wife. He states that he has not felt well since last visit. He thought that it was from his new medication (Valsartan). 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 He passed out at that time for an unknown length of time. He has been feeling tired, and sick since then. Has been feeing nauseous and extremely tired. He has felt like his legs have been feeling really heavy. His wife notes that he has been very tired and pale. He has been having blurred vision. He has not been having any focal weakness. Has been having some discomfort on his L arm for about a month. + SOB.  HYPERTENSION Hypertension status: uncontrolled  Satisfied with current treatment? no Duration of hypertension: chronic BP monitoring frequency:  not checking BP medication side effects:  no Medication compliance: excellent compliance Previous BP meds: losartan, lisinpril, valasartan Aspirin: yes Recurrent headaches: yes Visual changes: yes Palpitations: yes Dyspnea: yes Chest pain: yes Lower extremity edema: no Dizzy/lightheaded: yes   Relevant past medical, surgical, family and social history reviewed and updated as indicated. Interim medical history since our last visit reviewed. Allergies and medications reviewed and updated.  Review of Systems  Constitutional: Positive for diaphoresis and fatigue. Negative for activity change, appetite change, chills, fever and unexpected  weight change.  HENT: Negative.   Respiratory: Positive for chest tightness and shortness of breath. Negative for apnea, cough, choking, wheezing and stridor.   Cardiovascular: Positive for chest pain and palpitations. Negative for leg swelling.  Gastrointestinal: Positive for nausea. Negative for abdominal pain, anal bleeding, blood in stool, constipation, diarrhea, rectal pain and vomiting.  Endocrine: Negative.   Neurological: Positive for syncope and light-headedness. Negative for dizziness, tremors, seizures, facial asymmetry, speech difficulty, weakness, numbness and headaches.  Psychiatric/Behavioral: Negative.     Per HPI unless specifically indicated above     Objective:    BP (!) 181/93 (BP Location: Left Arm, Patient Position: Sitting, Cuff Size: Large)   Pulse (!) 51   Temp 98.3 F (36.8 C)   Wt 178 lb 1 oz (80.8 kg)   SpO2 98%   BMI 28.74 kg/m   Wt Readings from Last 3 Encounters:  08/22/17 178 lb 1 oz (80.8 kg)  08/08/17 171 lb 6 oz (77.7 kg)  02/10/17 165 lb 4 oz (75 kg)    Physical Exam  Constitutional: He is oriented to person, place, and time. He appears well-developed and well-nourished. He appears ill. He appears distressed.  HENT:  Head: Normocephalic and atraumatic.  Right Ear: Hearing normal.  Left Ear: Hearing normal.  Nose: Nose normal.  Eyes: Conjunctivae and lids are normal. Right eye exhibits no discharge. Left eye exhibits no discharge. No scleral icterus.  Cardiovascular: Normal rate, regular rhythm, normal heart sounds and intact distal pulses. Exam  reveals no gallop and no friction rub.  No murmur heard. Pulmonary/Chest: Effort normal and breath sounds normal. No respiratory distress. He has no wheezes. He has no rales. He exhibits no tenderness.  Abdominal: Soft. Bowel sounds are normal. He exhibits no distension and no mass. There is no tenderness. There is no rebound and no guarding.  Musculoskeletal: Normal range of motion.  Neurological:  He is alert and oriented to person, place, and time.  Skin: Skin is warm and intact. No rash noted. He is diaphoretic. No erythema. No pallor.  Psychiatric: He has a normal mood and affect. His speech is normal and behavior is normal. Judgment and thought content normal. Cognition and memory are normal.  Nursing note and vitals reviewed.   Results for orders placed or performed in visit on 08/08/17  Comprehensive metabolic panel  Result Value Ref Range   Glucose 111 (H) 65 - 99 mg/dL   BUN 15 8 - 27 mg/dL   Creatinine, Ser 1.11 0.76 - 1.27 mg/dL   GFR calc non Af Amer 69 >59 mL/min/1.73   GFR calc Af Amer 80 >59 mL/min/1.73   BUN/Creatinine Ratio 14 10 - 24   Sodium 142 134 - 144 mmol/L   Potassium 4.3 3.5 - 5.2 mmol/L   Chloride 102 96 - 106 mmol/L   CO2 23 20 - 29 mmol/L   Calcium 9.8 8.6 - 10.2 mg/dL   Total Protein 7.0 6.0 - 8.5 g/dL   Albumin 4.5 3.6 - 4.8 g/dL   Globulin, Total 2.5 1.5 - 4.5 g/dL   Albumin/Globulin Ratio 1.8 1.2 - 2.2   Bilirubin Total 0.3 0.0 - 1.2 mg/dL   Alkaline Phosphatase 95 39 - 117 IU/L   AST 20 0 - 40 IU/L   ALT 20 0 - 44 IU/L  CBC with Differential/Platelet  Result Value Ref Range   WBC 5.3 3.4 - 10.8 x10E3/uL   RBC 4.84 4.14 - 5.80 x10E6/uL   Hemoglobin 14.1 13.0 - 17.7 g/dL   Hematocrit 44.0 37.5 - 51.0 %   MCV 91 79 - 97 fL   MCH 29.1 26.6 - 33.0 pg   MCHC 32.0 31.5 - 35.7 g/dL   RDW 13.6 12.3 - 15.4 %   Platelets 258 150 - 379 x10E3/uL   Neutrophils 52 Not Estab. %   Lymphs 38 Not Estab. %   Monocytes 7 Not Estab. %   Eos 2 Not Estab. %   Basos 1 Not Estab. %   Neutrophils Absolute 2.8 1.4 - 7.0 x10E3/uL   Lymphocytes Absolute 2.0 0.7 - 3.1 x10E3/uL   Monocytes Absolute 0.4 0.1 - 0.9 x10E3/uL   EOS (ABSOLUTE) 0.1 0.0 - 0.4 x10E3/uL   Basophils Absolute 0.1 0.0 - 0.2 x10E3/uL   Immature Granulocytes 0 Not Estab. %   Immature Grans (Abs) 0.0 0.0 - 0.1 x10E3/uL  Lipid Panel w/o Chol/HDL Ratio  Result Value Ref Range   Cholesterol,  Total 244 (H) 100 - 199 mg/dL   Triglycerides 146 0 - 149 mg/dL   HDL 43 >39 mg/dL   VLDL Cholesterol Cal 29 5 - 40 mg/dL   LDL Calculated 172 (H) 0 - 99 mg/dL  Microalbumin, Urine Waived  Result Value Ref Range   Microalb, Ur Waived 30 (H) 0 - 19 mg/L   Creatinine, Urine Waived 200 10 - 300 mg/dL   Microalb/Creat Ratio <30 <30 mg/g  PSA  Result Value Ref Range   Prostate Specific Ag, Serum 1.6 0.0 - 4.0 ng/mL  TSH  Result Value Ref Range   TSH 1.870 0.450 - 4.500 uIU/mL  UA/M w/rflx Culture, Routine  Result Value Ref Range   Specific Gravity, UA 1.020 1.005 - 1.030   pH, UA 7.0 5.0 - 7.5   Color, UA Yellow Yellow   Appearance Ur Clear Clear   Leukocytes, UA Negative Negative   Protein, UA Negative Negative/Trace   Glucose, UA Negative Negative   Ketones, UA Negative Negative   RBC, UA Negative Negative   Bilirubin, UA Negative Negative   Urobilinogen, Ur 0.2 0.2 - 1.0 mg/dL   Nitrite, UA Negative Negative      Assessment & Plan:   Problem List Items Addressed This Visit      Cardiovascular and Mediastinum   CAD (coronary artery disease)    Known CAD- will get him to the ER today for evaluation. Appointment scheduled with cardiology for Tuesday.       Relevant Medications   amLODipine (NORVASC) 10 MG tablet   Other Relevant Orders   EKG 12-Lead (Completed)   Ambulatory referral to Cardiology   Hypertension - Primary    Not under good control. Will add amlodipine and recheck in 2 weeks. Getting him into see cardiology. To see them on Tuesday. However, given his other symptoms right now, he is to go to the ER for further evaluation. Labs held as he is going to ER.      Relevant Medications   amLODipine (NORVASC) 10 MG tablet   Other Relevant Orders   EKG 12-Lead (Completed)   Ambulatory referral to Cardiology    Other Visit Diagnoses    Chest pain, unspecified type       EKG normal today, however, pain returned as the appointment continued- to ER for evaluation  now. Patient to go by private car. Call to ER made.    Relevant Orders   EKG 12-Lead (Completed)   Ambulatory referral to Cardiology   Diaphoresis       EKG normal today, however, pain returned as the appointment continued- to ER for evaluation now. Patient to go by private car. Call to ER made.    Relevant Orders   EKG 12-Lead (Completed)   Ambulatory referral to Cardiology   Syncope, unspecified syncope type       Unclear if and how long he was out. To ER and then to cardiology if not admitted.    Relevant Medications   amLODipine (NORVASC) 10 MG tablet   Other Relevant Orders   EKG 12-Lead (Completed)   Ambulatory referral to Cardiology       Follow up plan: Return Pending ER evaluation (prob next week).

## 2017-08-22 NOTE — Assessment & Plan Note (Addendum)
Known CAD- will get him to the ER today for evaluation. Appointment scheduled with cardiology for Tuesday.

## 2017-08-26 DIAGNOSIS — I2510 Atherosclerotic heart disease of native coronary artery without angina pectoris: Secondary | ICD-10-CM | POA: Diagnosis not present

## 2017-08-26 DIAGNOSIS — I208 Other forms of angina pectoris: Secondary | ICD-10-CM | POA: Diagnosis not present

## 2017-08-26 DIAGNOSIS — R0602 Shortness of breath: Secondary | ICD-10-CM | POA: Diagnosis not present

## 2017-08-26 DIAGNOSIS — R001 Bradycardia, unspecified: Secondary | ICD-10-CM | POA: Diagnosis not present

## 2017-08-28 ENCOUNTER — Ambulatory Visit: Payer: Medicare Other | Admitting: Cardiovascular Disease

## 2017-09-01 ENCOUNTER — Encounter: Payer: Self-pay | Admitting: Family Medicine

## 2017-09-15 ENCOUNTER — Encounter: Payer: Self-pay | Admitting: Family Medicine

## 2017-09-15 ENCOUNTER — Ambulatory Visit: Payer: Medicare Other | Admitting: Family Medicine

## 2017-09-15 VITALS — BP 153/78 | HR 62 | Wt 172.9 lb

## 2017-09-15 DIAGNOSIS — E782 Mixed hyperlipidemia: Secondary | ICD-10-CM

## 2017-09-15 DIAGNOSIS — R7301 Impaired fasting glucose: Secondary | ICD-10-CM

## 2017-09-15 DIAGNOSIS — I10 Essential (primary) hypertension: Secondary | ICD-10-CM

## 2017-09-15 DIAGNOSIS — I2510 Atherosclerotic heart disease of native coronary artery without angina pectoris: Secondary | ICD-10-CM | POA: Diagnosis not present

## 2017-09-15 MED ORDER — HYDROCHLOROTHIAZIDE 25 MG PO TABS
25.0000 mg | ORAL_TABLET | Freq: Every day | ORAL | 3 refills | Status: DC
Start: 2017-09-15 — End: 2018-02-23

## 2017-09-15 MED ORDER — ROSUVASTATIN CALCIUM 5 MG PO TABS: 5.0000 mg | ORAL_TABLET | ORAL | 3 refills | Status: DC

## 2017-09-15 NOTE — Assessment & Plan Note (Signed)
Seeing cardiology. Working on getting tests done. Get tests done. Call with any concerns.

## 2017-09-15 NOTE — Assessment & Plan Note (Signed)
Doing better, but not quite there. Will add HCTZ and recheck 1 month. BMP drawn today.

## 2017-09-15 NOTE — Assessment & Plan Note (Signed)
A1c not drawn last visit- will recheck next visit.

## 2017-09-15 NOTE — Assessment & Plan Note (Signed)
Would like to retry statin. Will try crestor 5mg  every other day and see how he does.

## 2017-09-15 NOTE — Patient Instructions (Addendum)
Co-Q10  DASH Eating Plan DASH stands for "Dietary Approaches to Stop Hypertension." The DASH eating plan is a healthy eating plan that has been shown to reduce high blood pressure (hypertension). It may also reduce your risk for type 2 diabetes, heart disease, and stroke. The DASH eating plan may also help with weight loss. What are tips for following this plan? General guidelines  Avoid eating more than 2,300 mg (milligrams) of salt (sodium) a day. If you have hypertension, you may need to reduce your sodium intake to 1,500 mg a day.  Limit alcohol intake to no more than 1 drink a day for nonpregnant women and 2 drinks a day for men. One drink equals 12 oz of beer, 5 oz of wine, or 1 oz of hard liquor.  Work with your health care provider to maintain a healthy body weight or to lose weight. Ask what an ideal weight is for you.  Get at least 30 minutes of exercise that causes your heart to beat faster (aerobic exercise) most days of the week. Activities may include walking, swimming, or biking.  Work with your health care provider or diet and nutrition specialist (dietitian) to adjust your eating plan to your individual calorie needs. Reading food labels  Check food labels for the amount of sodium per serving. Choose foods with less than 5 percent of the Daily Value of sodium. Generally, foods with less than 300 mg of sodium per serving fit into this eating plan.  To find whole grains, look for the word "whole" as the first word in the ingredient list. Shopping  Buy products labeled as "low-sodium" or "no salt added."  Buy fresh foods. Avoid canned foods and premade or frozen meals. Cooking  Avoid adding salt when cooking. Use salt-free seasonings or herbs instead of table salt or sea salt. Check with your health care provider or pharmacist before using salt substitutes.  Do not fry foods. Cook foods using healthy methods such as baking, boiling, grilling, and broiling instead.  Cook  with heart-healthy oils, such as olive, canola, soybean, or sunflower oil. Meal planning   Eat a balanced diet that includes: ? 5 or more servings of fruits and vegetables each day. At each meal, try to fill half of your plate with fruits and vegetables. ? Up to 6-8 servings of whole grains each day. ? Less than 6 oz of lean meat, poultry, or fish each day. A 3-oz serving of meat is about the same size as a deck of cards. One egg equals 1 oz. ? 2 servings of low-fat dairy each day. ? A serving of nuts, seeds, or beans 5 times each week. ? Heart-healthy fats. Healthy fats called Omega-3 fatty acids are found in foods such as flaxseeds and coldwater fish, like sardines, salmon, and mackerel.  Limit how much you eat of the following: ? Canned or prepackaged foods. ? Food that is high in trans fat, such as fried foods. ? Food that is high in saturated fat, such as fatty meat. ? Sweets, desserts, sugary drinks, and other foods with added sugar. ? Full-fat dairy products.  Do not salt foods before eating.  Try to eat at least 2 vegetarian meals each week.  Eat more home-cooked food and less restaurant, buffet, and fast food.  When eating at a restaurant, ask that your food be prepared with less salt or no salt, if possible. What foods are recommended? The items listed may not be a complete list. Talk with your dietitian  about what dietary choices are best for you. Grains Whole-grain or whole-wheat bread. Whole-grain or whole-wheat pasta. Brown rice. [place]. Bulgur. Whole-grain and low-sodium cereals. Pita bread. Low-fat, low-sodium crackers. Whole-wheat flour tortillas. Vegetables Fresh or frozen vegetables (raw, steamed, roasted, or grilled). Low-sodium or reduced-sodium tomato and vegetable juice. Low-sodium or reduced-sodium tomato sauce and tomato paste. Low-sodium or reduced-sodium canned vegetables. Fruits All fresh, dried, or frozen fruit. Canned fruit in natural juice  (without added sugar). Meat and other protein foods Skinless chicken or [place]. Ground chicken or [place]. Pork with fat trimmed off. Fish and seafood. Egg whites. Dried beans, peas, or lentils. Unsalted nuts, nut butters, and seeds. Unsalted canned beans. Lean cuts of beef with fat trimmed off. Low-sodium, lean deli meat. Dairy Low-fat (1%) or fat-free (skim) milk. Fat-free, low-fat, or reduced-fat cheeses. Nonfat, low-sodium ricotta or cottage cheese. Low-fat or nonfat yogurt. Low-fat, low-sodium cheese. Fats and oils Soft margarine without trans fats. Vegetable oil. Low-fat, reduced-fat, or light mayonnaise and salad dressings (reduced-sodium). Canola, safflower, olive, soybean, and sunflower oils. Avocado. Seasoning and other foods Herbs. Spices. Seasoning mixes without salt. Unsalted popcorn and pretzels. Fat-free sweets. What foods are not recommended? The items listed may not be a complete list. Talk with your dietitian about what dietary choices are best for you. Grains Baked goods made with fat, such as croissants, muffins, or some breads. Dry pasta or rice meal packs. Vegetables Creamed or fried vegetables. Vegetables in a cheese sauce. Regular canned vegetables (not low-sodium or reduced-sodium). Regular canned tomato sauce and paste (not low-sodium or reduced-sodium). Regular tomato and vegetable juice (not low-sodium or reduced-sodium). Angie Fava. Olives. Fruits Canned fruit in a light or heavy syrup. Fried fruit. Fruit in cream or butter sauce. Meat and other protein foods Fatty cuts of meat. Ribs. Fried meat. Berniece Salines. Sausage. Bologna and other processed lunch meats. Salami. Fatback. Hotdogs. Bratwurst. Salted nuts and seeds. Canned beans with added salt. Canned or smoked fish. Whole eggs or egg yolks. Chicken or [place] with skin. Dairy Whole or 2% milk, cream, and half-and-half. Whole or full-fat cream cheese. Whole-fat or sweetened yogurt. Full-fat cheese. Nondairy creamers. Whipped  toppings. Processed cheese and cheese spreads. Fats and oils Butter. Stick margarine. Lard. Shortening. Ghee. Bacon fat. Tropical oils, such as coconut, palm kernel, or palm oil. Seasoning and other foods Salted popcorn and pretzels. Onion salt, garlic salt, seasoned salt, table salt, and sea salt. Worcestershire sauce. Tartar sauce. Barbecue sauce. Teriyaki sauce. Soy sauce, including reduced-sodium. Steak sauce. Canned and packaged gravies. Fish sauce. Oyster sauce. Cocktail sauce. Horseradish that you find on the shelf. Ketchup. Mustard. Meat flavorings and tenderizers. Bouillon cubes. Hot sauce and Tabasco sauce. Premade or packaged marinades. Premade or packaged taco seasonings. Relishes. Regular salad dressings. Where to find more information:  National Heart, Lung, and [place]: https://wilson-eaton.com/  American Heart Association: www.heart.org Summary  The DASH eating plan is a healthy eating plan that has been shown to reduce high blood pressure (hypertension). It may also reduce your risk for type 2 diabetes, heart disease, and stroke.  With the DASH eating plan, you should limit salt (sodium) intake to 2,300 mg a day. If you have hypertension, you may need to reduce your sodium intake to 1,500 mg a day.  When on the DASH eating plan, aim to eat more fresh fruits and vegetables, whole grains, lean proteins, low-fat dairy, and heart-healthy fats.  Work with your health care provider or diet and nutrition specialist (dietitian) to adjust your eating plan to  your individual calorie needs. This information is not intended to replace advice given to you by your health care provider. Make sure you discuss any questions you have with your health care provider. Document Released: 06/06/2011 Document Revised: 06/10/2016 Document Reviewed: 06/10/2016 Elsevier Interactive Patient Education  [employer].

## 2017-09-15 NOTE — Progress Notes (Signed)
BP (!) 153/78 (BP Location: Left Arm, Cuff Size: Normal)   Pulse 62   Wt 172 lb 14.4 oz (78.4 kg)   SpO2 96%   BMI 27.91 kg/m    Subjective:    Patient ID: Nathan Fields, male    DOB: Oct 18, 1950, 67 y.o.   MRN: [id number]  HPI: Nathan Fields is a 67 y.o. male  Chief Complaint  Patient presents with  . Hypertension  . Hyperlipidemia  . Blood Sugar Problem   Was sent to ER following his appointment here last time. It took too long, and he did not stay to be seen. Patient saw Cardiology after last appointment. He is being set up for a lexiscan myoview and a ECHO. He is also getting a holter and carotid dopplers for his bradycardia and syncope. He is to have those tests and follow up with them in 1 month. He was also referred to neurology by cardiology. He has not done the tests yet because he had a GI bug at that time. Has not gotten it rescheduled.   HYPERTENSION Hypertension status: better  Satisfied with current treatment? no Duration of hypertension: chronic BP monitoring frequency:  not checking BP medication side effects:  no Medication compliance: good compliance Previous BP meds: valsartan, amlodipine Aspirin: no Recurrent headaches: no Visual changes: no Palpitations: yes Dyspnea: yes Chest pain: yes Lower extremity edema: no Dizzy/lightheaded: yes   HYPERLIPIDEMIA Hyperlipidemia status: Hasn't been on anything Satisfied with current treatment?  no Side effects:  yes Medication compliance: poor compliance Past cholesterol meds: atorvasatin, zetia, niacin Supplements: none Aspirin:  no The 10-year ASCVD risk score Mikey Bussing DC Jr., et al., 2013) is: 41.7%   Values used to calculate the score:     Age: 39 years     Sex: Male     Is Non-Hispanic African American: No     Diabetic: Yes     Tobacco smoker: No     Systolic Blood Pressure: 287 mmHg     Is BP treated: Yes     HDL Cholesterol: 43 mg/dL     Total Cholesterol: 244 mg/dL Chest pain:   yes Coronary artery disease:  yes Family history CAD:  yes  Relevant past medical, surgical, family and social history reviewed and updated as indicated. Interim medical history since our last visit reviewed. Allergies and medications reviewed and updated.  Review of Systems  Constitutional: Negative.   Respiratory: Negative.   Cardiovascular: Negative.   Neurological: Positive for syncope. Negative for dizziness, tremors, seizures, facial asymmetry, speech difficulty, weakness, light-headedness, numbness and headaches.  Psychiatric/Behavioral: Negative.     Per HPI unless specifically indicated above    Objective:    BP (!) 153/78 (BP Location: Left Arm, Cuff Size: Normal)   Pulse 62   Wt 172 lb 14.4 oz (78.4 kg)   SpO2 96%   BMI 27.91 kg/m   Wt Readings from Last 3 Encounters:  09/15/17 172 lb 14.4 oz (78.4 kg)  08/22/17 171 lb (77.6 kg)  08/22/17 178 lb 1 oz (80.8 kg)    Physical Exam  Constitutional: He is oriented to person, place, and time. He appears well-developed and well-nourished. No distress.  HENT:  Head: Normocephalic and atraumatic.  Right Ear: Hearing normal.  Left Ear: Hearing normal.  Nose: Nose normal.  Eyes: Conjunctivae and lids are normal. Right eye exhibits no discharge. Left eye exhibits no discharge. No scleral icterus.  Cardiovascular: Normal rate, regular rhythm, normal heart sounds and intact distal  pulses. Exam reveals no gallop and no friction rub.  No murmur heard. Pulmonary/Chest: Effort normal and breath sounds normal. No respiratory distress. He has no wheezes. He has no rales. He exhibits no tenderness.  Musculoskeletal: Normal range of motion.  Neurological: He is alert and oriented to person, place, and time.  Skin: Skin is warm, dry and intact. No rash noted. He is not diaphoretic. No erythema. No pallor.  Psychiatric: He has a normal mood and affect. His speech is normal and behavior is normal. Judgment and thought content normal.  Cognition and memory are normal.  Nursing note and vitals reviewed.   Results for orders placed or performed during the hospital encounter of 60/63/01  Basic metabolic panel  Result Value Ref Range   Sodium 138 135 - 145 mmol/L   Potassium 4.3 3.5 - 5.1 mmol/L   Chloride 107 101 - 111 mmol/L   CO2 23 22 - 32 mmol/L   Glucose, Bld 162 (H) 65 - 99 mg/dL   BUN 23 (H) 6 - 20 mg/dL   Creatinine, Ser 1.05 0.61 - 1.24 mg/dL   Calcium 9.1 8.9 - 10.3 mg/dL   GFR calc non Af Amer >60 >60 mL/min   GFR calc Af Amer >60 >60 mL/min   Anion gap 8 5 - 15  CBC  Result Value Ref Range   WBC 5.7 3.8 - 10.6 K/uL   RBC 4.84 4.40 - 5.90 MIL/uL   Hemoglobin 14.2 13.0 - 18.0 g/dL   HCT 43.8 40.0 - 52.0 %   MCV 90.5 80.0 - 100.0 fL   MCH 29.4 26.0 - 34.0 pg   MCHC 32.4 32.0 - 36.0 g/dL   RDW 14.1 11.5 - 14.5 %   Platelets 235 150 - 440 K/uL  Troponin I  Result Value Ref Range   Troponin I <0.03 <0.03 ng/mL      Assessment & Plan:   Problem List Items Addressed This Visit      Cardiovascular and Mediastinum   CAD (coronary artery disease)    Seeing cardiology. Working on getting tests done. Get tests done. Call with any concerns.       Relevant Medications   rosuvastatin (CRESTOR) 5 MG tablet   hydrochlorothiazide (HYDRODIURIL) 25 MG tablet   Hypertension - Primary    Doing better, but not quite there. Will add HCTZ and recheck 1 month. BMP drawn today.      Relevant Medications   rosuvastatin (CRESTOR) 5 MG tablet   hydrochlorothiazide (HYDRODIURIL) 25 MG tablet   Other Relevant Orders   Basic metabolic panel     Endocrine   Impaired fasting glucose    A1c not drawn last visit- will recheck next visit.         Other   Hyperlipidemia    Would like to retry statin. Will try crestor 5mg  every other day and see how he does.       Relevant Medications   rosuvastatin (CRESTOR) 5 MG tablet   hydrochlorothiazide (HYDRODIURIL) 25 MG tablet       Follow up plan: Return in about  4 weeks (around 10/13/2017) for Follow up BP/Cholesterol.

## 2017-09-16 LAB — BASIC METABOLIC PANEL
BUN/Creatinine Ratio: 14 (ref 10–24)
BUN: 15 mg/dL (ref 8–27)
CALCIUM: 9.3 mg/dL (ref 8.6–10.2)
CHLORIDE: 102 mmol/L (ref 96–106)
CO2: 23 mmol/L (ref 20–29)
Creatinine, Ser: 1.07 mg/dL (ref 0.76–1.27)
GFR calc Af Amer: 84 mL/min/{1.73_m2} (ref >59)
GFR calc non Af Amer: 72 mL/min/{1.73_m2} (ref >59)
GLUCOSE: 87 mg/dL (ref 65–99)
POTASSIUM: 3.9 mmol/L (ref 3.5–5.2)
Sodium: 143 mmol/L (ref 134–144)

## 2017-09-29 DIAGNOSIS — R001 Bradycardia, unspecified: Secondary | ICD-10-CM | POA: Diagnosis not present

## 2017-10-07 ENCOUNTER — Ambulatory Visit: Payer: Medicare Other | Admitting: Family Medicine

## 2017-11-27 ENCOUNTER — Encounter: Payer: Self-pay | Admitting: Family Medicine

## 2017-11-27 ENCOUNTER — Ambulatory Visit (INDEPENDENT_AMBULATORY_CARE_PROVIDER_SITE_OTHER): Payer: Medicare Other | Admitting: Family Medicine

## 2017-11-27 ENCOUNTER — Telehealth: Payer: Self-pay

## 2017-11-27 VITALS — BP 185/81 | HR 68 | Wt 188.0 lb

## 2017-11-27 DIAGNOSIS — S46211A Strain of muscle, fascia and tendon of other parts of biceps, right arm, initial encounter: Secondary | ICD-10-CM | POA: Diagnosis not present

## 2017-11-27 DIAGNOSIS — R55 Syncope and collapse: Secondary | ICD-10-CM

## 2017-11-27 MED ORDER — OXYCODONE-ACETAMINOPHEN 10-325 MG PO TABS: 1.0000 | ORAL_TABLET | Freq: Three times a day (TID) | ORAL | 0 refills | Status: AC | PRN

## 2017-11-27 MED ORDER — KETOROLAC TROMETHAMINE 60 MG/2ML IM SOLN
60.0000 mg | Freq: Once | INTRAMUSCULAR | Status: AC
  Administered 2017-11-27: 60 mg via INTRAMUSCULAR

## 2017-11-27 NOTE — Telephone Encounter (Signed)
Called [facility] for urgent referral for possible right bicep rupture. Appointment made for 12/02/2017 at 2pm at [place] office. Patient aware.

## 2017-11-27 NOTE — Progress Notes (Signed)
Called to lobby due to patient feeling dizzy and sweating, patient was sitting in chair in lobby with eyes closed, patient was diaphoretic, alert and oriented x 3, BP 75/48 HR 84, patient stated he felt nauseated and thought he was going to pass out. Patient just received toradol injection in office. Dr.Johnson present to see patient, BP rechecked at 118/62. Patient states he is feeling better, brought back to exam room for further evaluation. Order for EKG by Dr.Johnson before discharge.

## 2017-11-27 NOTE — Progress Notes (Addendum)
BP (!) 185/81   Pulse 68   Wt 188 lb (85.3 kg)   SpO2 96%   BMI 30.34 kg/m    Subjective:    Patient ID: Nathan Fields, Nathan Fields    DOB: Oct 24, 1950, 67 y.o.   MRN: [id number]  HPI: Nathan Fields is a 67 y.o. Nathan Fields  Chief Complaint  Patient presents with  . Arm Injury    Put was picking up 300 lbs yesterday, heard and felt ripping.   ARM PAIN Duration: 1 day Location: right arm Mechanism of injury: trauma- was picking up a 300lb tree trunk and heard a pop followed by intense pain in his R arm Onset: sudden Severity: 10/10  Quality:  Sharp and severe Frequency: constant Radiation: no Aggravating factors: flexion of elbow   Alleviating factors: not moving it   Status: stable Treatments attempted: rest and aleve  Relief with NSAIDs?:  no Swelling: yes Redness: no  Warmth: no Trauma: yes Chest pain: no  Shortness of breath: no  Fever: no Decreased sensation: no Paresthesias: no Weakness: no  Relevant past medical, surgical, family and social history reviewed and updated as indicated. Interim medical history since our last visit reviewed. Allergies and medications reviewed and updated.  Review of Systems  Constitutional: Negative.   Respiratory: Negative.   Cardiovascular: Negative.   Musculoskeletal: Positive for myalgias. Negative for arthralgias, back pain, gait problem, joint swelling, neck pain and neck stiffness.  Skin: Negative.   Neurological: Negative.   Psychiatric/Behavioral: Negative.     Per HPI unless specifically indicated above     Objective:    BP (!) 185/81   Pulse 68   Wt 188 lb (85.3 kg)   SpO2 96%   BMI 30.34 kg/m   Wt Readings from Last 3 Encounters:  11/27/17 188 lb (85.3 kg)  09/15/17 172 lb 14.4 oz (78.4 kg)  08/22/17 171 lb (77.6 kg)    Physical Exam  Constitutional: He is oriented to person, place, and time. He appears well-developed and well-nourished. No distress.  HENT:  Head: Normocephalic and atraumatic.  Right  Ear: Hearing normal.  Left Ear: Hearing normal.  Nose: Nose normal.  Eyes: Conjunctivae and lids are normal. Right eye exhibits no discharge. Left eye exhibits no discharge. No scleral icterus.  Cardiovascular: Normal rate, regular rhythm, normal heart sounds and intact distal pulses. Exam reveals no gallop and no friction rub.  No murmur heard. Pulmonary/Chest: Effort normal and breath sounds normal. No stridor. No respiratory distress. He has no wheezes. He has no rales. He exhibits no tenderness.  Musculoskeletal: He exhibits edema, tenderness and deformity.  Lump at radial elbow near bicep, tenderness to palpation of the bicep muscle, significantly decreased ROM of the R arm and shoulder  Neurological: He is alert and oriented to person, place, and time.  Skin: Skin is warm, dry and intact. Capillary refill takes less than 2 seconds. No rash noted. He is not diaphoretic. No erythema. No pallor.  Psychiatric: He has a normal mood and affect. His speech is normal and behavior is normal. Judgment and thought content normal. Cognition and memory are normal.    Results for orders placed or performed in visit on 12/22/74  Basic metabolic panel  Result Value Ref Range   Glucose 87 65 - 99 mg/dL   BUN 15 8 - 27 mg/dL   Creatinine, Ser 1.07 0.76 - 1.27 mg/dL   GFR calc non Af Amer 72 >59 mL/min/1.73   GFR calc Af Amer 84 >  59 mL/min/1.73   BUN/Creatinine Ratio 14 10 - 24   Sodium 143 134 - 144 mmol/L   Potassium 3.9 3.5 - 5.2 mmol/L   Chloride 102 96 - 106 mmol/L   CO2 23 20 - 29 mmol/L   Calcium 9.3 8.6 - 10.2 mg/dL      Assessment & Plan:   Problem List Items Addressed This Visit    None    Visit Diagnoses    Rupture of right proximal biceps tendon, initial encounter    -  Primary   Will get him into ortho. Toradol shot given today for pain. Short term supply of pain medicine until he gets into ortho. Immobilize arm. Call with concerns.    Relevant Medications   ketorolac (TORADOL)  injection 60 mg (Completed)   Other Relevant Orders   Ambulatory referral to Orthopedic Surgery   Vasovagal near syncope       Due to shot. Better with rest, snack and fluids. Information given. Call with any concerns. Continue to monitor.    Relevant Orders   EKG 12-Lead (Completed)   EKG 12-Lead (Completed)       Follow up plan: Return if symptoms worsen or fail to improve.    ADDENDUM 11/27/17 2:00PM- As patient was leaving, he became dizzy and felt like he was going to pass out. BP initially 75/40. Increased to 118/78. Slightly diaphoretic. No chest pain. No SOB. Shakey and nauseous. Feet elevated. Bradycardic with regular rhythem on exam, Lungs clear bilaterally. + diaphoresis and pallor. Cold compress placed on his neck. Brought back to a room and EKG done- normal except brady at 46. Given some water and a snack. Repeat EKG after 10 min. HR improved to 55bpm, t wave inversions- no change from prior in February. BP came back to 163/71. To go home, rest. Fluids. Call with any concerns.   <40 minutes spent in counseling and coordination of care today.

## 2017-11-27 NOTE — Patient Instructions (Addendum)
[facility] 12/02/17, 2PM [phone] Carlynn Spry [address] [place], [place] [address]   Biceps Tendon Disruption (Proximal) The proximal biceps tendon is a strong cord of tissue that connects the biceps muscle, on the front of the upper arm, to the shoulder blade. A proximal biceps tendon disruption can include a partial or complete tear of the tendon near where it connects to the bone near the shoulder. A proximal biceps tendon disruption can interfere with the ability to lift the arm in front of the body, stabilize the shoulder, bend the elbow, and turn the hand palm-up (supination). What are the causes? A biceps tendon disruption happens when the tendon is exposed to too much force. This excess force may be caused by:  The elbow being suddenly straightened from a bent position because of an external force. This could happen, for example, while catching a heavy weight or being pulled when waterskiing.  Wear and tear from physical activity.  Breaking a fall with your hand.  What increases the risk? The following factors may make you more likely to develop this condition:  Playing contact sports.  Doing activities or sports that involve throwing or overhead movements, such as racket sports, gymnastics, or baseball.  Doing activities or sports that involve putting sudden force on the arm, such as weightlifting or waterskiing.  Having a weakened tendon. The tendon may be weak because of: ? Long-lasting (chronic) biceps tendinitis. ? Certain medical conditions, such as diabetes or rheumatoid arthritis. ? Repeated corticosteroid use. ? Repetitive overhead movements.  What are the signs or symptoms? Symptoms of this condition may include:  Sudden sharp pain in the front of the shoulder. Pain may get worse during certain movements, such as: ? Lifting or carrying objects. ? Straightening the elbow. ? Throwing or using overhead movements.  Inflammation or a feeling of unusual  warmth on the front of the shoulder.  Painful tightening (spasm) of the biceps muscle.  A bulge on the inside of the upper arm when the elbow is bent.  Bruising in the shoulder or upper arm. This may develop 24-48 hours after the tendon is injured.  Limited range of motion of the shoulder and elbow.  Weakness in the elbow and forearm when: ? Bending the elbow. ? Rotating the wrist.  How is this diagnosed? This condition is diagnosed based on your symptoms, your medical history, and a physical exam. Your health care provider may test the strength and range of motion of your shoulder and elbow. You may have imaging tests, such as X-rays, MRI, or ultrasound. How is this treated? This condition is treated by resting and icing the injured area, and by doing physical therapy exercises. Depending on the severity of your condition, treatment may also include:  Medicines to help relieve pain and inflammation.  Avoiding certain activities that put stress on your shoulder.  One or more injections of medicines (corticosteroids) into your upper arm to help reduce inflammation (rare).  Surgery to repair the tear. This may be needed if nonsurgical treatments do not improve your condition.  Follow these instructions at home: Managing pain, stiffness, and swelling  If directed, put ice on the injured area: ? Put ice in a plastic bag. ? Place a towel between your skin and the bag. ? Leave the ice on for 20 minutes, 2-3 times a day.  Move your fingers often to avoid stiffness and to lessen swelling.  Raise (elevate) the injured area while you are sitting or lying down. Activity  Return to your normal activities as told by your health care provider. Ask your health care provider what activities are safe for you.  Avoid activities that cause pain or make your condition worse.  Do not lift anything that is heavier than 10 lb (4.5 kg) until your health care provider approves.  Do exercises as  told by your health care provider. General instructions  Take over-the-counter and prescription medicines only as told by your health care provider.  Do not drive or operate heavy machinery while taking prescription pain medicines.  Keep all follow-up visits as told by your health care provider. This is important. How is this prevented?  Warm up and stretch before being active.  Cool down and stretch after being active.  Give your body time to rest between periods of activity.  Make sure to use equipment that fits you.  Be safe and responsible while being active to avoid falls.  Maintain physical fitness, including strength and flexibility. Contact a health care provider if:  You have symptoms that get worse or do not get better after 2 weeks of treatment.  You develop new symptoms. Get help right away if:  You have severe pain.  You develop pain or numbness in your hand.  Your hand feels unusually cold.  Your fingernails turn a dark color, such as blue or gray. This information is not intended to replace advice given to you by your health care provider. Make sure you discuss any questions you have with your health care provider. Document Released: 06/17/2005 Document Revised: 02/22/2016 Document Reviewed: 05/26/2015 Elsevier Interactive Patient Education  2018 [employer].  Vasovagal Syncope, Adult Syncope, which is commonly known as fainting or passing out, is a temporary loss of consciousness. It occurs when the blood flow to the brain is reduced. Vasovagal syncope, also called neurocardiogenic syncope, is a fainting spell that happens when blood flow to the brain is reduced because of a sudden drop in heart rate and blood pressure. Vasovagal syncope is usually harmless. However, you can get injured if you fall during a fainting spell. What are the causes? This condition is caused by a drop in heart rate and blood pressure, usually in response to a trigger. Many things  and situations can trigger an episode, including:  Pain.  Fear.  The sight of blood. This may occur during medical procedures, such as when blood is being drawn from a vein.  Common activities, such as coughing, swallowing, stretching, or going to the bathroom.  Emotional stress.  Being in a confined space.  Prolonged standing, especially in a warm environment.  Lack of sleep or rest.  Not eating for a long time.  Not drinking enough liquids.  Recent illness.  Drinking alcohol.  Taking drugs that affect blood pressure, such as marijuana, cocaine, opiates, or inhalants.  What are the signs or symptoms? Before a fainting episode, you may:  Feel dizzy or light-headed.  Become pale.  Sense that you are going to faint.  Feel like the room is spinning.  Only see directly ahead (tunnel vision).  Feel sick to your stomach (nauseous).  See spots.  Slowly lose vision.  Hear ringing in your ears.  Have a headache.  Feel warm and sweaty.  Feel a sensation of pins and needles.  During the fainting spell, you may twitch or make jerky movements. Fainting spells usually last no longer than a few minutes before you wake up. If you get up too quickly before your body can recover,  you may faint again. How is this diagnosed? This condition is diagnosed based on your symptoms, your medical history, and a physical exam. Tests may be done to rule out other causes of fainting. Tests may include:  Blood tests.  Heart tests, such as an electrocardiogram (ECG), echocardiogram, or electrophysiology study.  A test to check your response to changes in position (tilt table test).  How is this treated? Usually, treatment is not needed for this condition. Your health care provider may suggest ways to help prevent fainting episodes. These may include:  Drinking additional fluids if you are exposed to a trigger.  Sitting or lying down if you notice signs that an episode is  coming.  If your fainting spells continue, your health care provider may recommend that you:  Take medicines to prevent fainting or to help reduce further episodes of fainting.  Do certain exercises.  Wear compression stockings.  Have surgery to place a pacemaker in your body (rare).  Follow these instructions at home:  Learn to identify the signs that an episode is coming.  Sit or lie down at the first sign of a fainting spell. If you sit down, put your head down between your legs. If you lie down, swing your legs up in the air to increase blood flow to the brain.  Avoid hot tubs and saunas.  Avoid standing for a long time. If you have to stand for a long time, try: ? Crossing your legs. ? Flexing and stretching your leg muscles. ? Squatting. ? Moving your legs. ? Bending over.  Drink enough fluid to keep your urine clear or pale yellow.  Make changes to your diet that your health care provider recommends. You may be told to: ? Avoid caffeine. ? Eat more salt.  Take over-the-counter and prescription medicines only as told by your health care provider. Contact a health care provider if:  You continue to have fainting spells despite treatment.  You faint more often despite treatment.  You lose consciousness for more than a few minutes.  You faint during or after exercising or after being startled.  You have twitching or jerky movements for longer than a few seconds during a fainting spell.  You have an episode of twitching or jerky movements without fainting. Get help right away if:  A fainting spell leads to an injury or bleeding.  You have new symptoms that occur with the fainting spells, such as: ? Shortness of breath. ? Chest pain. ? Irregular heartbeat.  You twitch or make jerky movements for more than 5 minutes.  You twitch or make jerky movements during more than one fainting spell. This information is not intended to replace advice given to you by  your health care provider. Make sure you discuss any questions you have with your health care provider. Document Released: 06/03/2012 Document Revised: 11/29/2015 Document Reviewed: 04/15/2015 Elsevier Interactive Patient Education  [employer].

## 2017-11-27 NOTE — Addendum Note (Signed)
Addended by: Valerie Roys on: 11/27/2017 02:55 PM   Modules accepted: Orders, Level of Service

## 2017-12-02 DIAGNOSIS — S46219A Strain of muscle, fascia and tendon of other parts of biceps, unspecified arm, initial encounter: Secondary | ICD-10-CM | POA: Insufficient documentation

## 2017-12-02 DIAGNOSIS — S46211A Strain of muscle, fascia and tendon of other parts of biceps, right arm, initial encounter: Secondary | ICD-10-CM | POA: Diagnosis not present

## 2017-12-10 DIAGNOSIS — S46211A Strain of muscle, fascia and tendon of other parts of biceps, right arm, initial encounter: Secondary | ICD-10-CM | POA: Diagnosis not present

## 2017-12-12 ENCOUNTER — Other Ambulatory Visit: Payer: Self-pay | Admitting: Family Medicine

## 2018-02-09 ENCOUNTER — Other Ambulatory Visit: Payer: Self-pay | Admitting: Family Medicine

## 2018-02-10 NOTE — Telephone Encounter (Signed)
Diovan 80 mg refill Last Refill:  08/08/17  # 90 Last OV: 09/15/17   PCP: Dr. Wynetta Emery Pharmacy:[facility] [phone]

## 2018-02-10 NOTE — Telephone Encounter (Signed)
Needs follow up appointment.  

## 2018-02-11 ENCOUNTER — Encounter: Payer: Self-pay | Admitting: Family Medicine

## 2018-02-11 NOTE — Telephone Encounter (Signed)
LVM on both lines for pt to give a call back. Printing a letter to mail as well.

## 2018-02-23 ENCOUNTER — Encounter: Payer: Self-pay | Admitting: Family Medicine

## 2018-02-23 ENCOUNTER — Ambulatory Visit (INDEPENDENT_AMBULATORY_CARE_PROVIDER_SITE_OTHER): Payer: Medicare Other | Admitting: Family Medicine

## 2018-02-23 VITALS — BP 151/86 | HR 72 | Temp 98.2°F | Ht 64.5 in | Wt 172.1 lb

## 2018-02-23 DIAGNOSIS — I10 Essential (primary) hypertension: Secondary | ICD-10-CM | POA: Diagnosis not present

## 2018-02-23 DIAGNOSIS — R55 Syncope and collapse: Secondary | ICD-10-CM | POA: Diagnosis not present

## 2018-02-23 DIAGNOSIS — E782 Mixed hyperlipidemia: Secondary | ICD-10-CM

## 2018-02-23 DIAGNOSIS — M79602 Pain in left arm: Secondary | ICD-10-CM | POA: Diagnosis not present

## 2018-02-23 DIAGNOSIS — R7301 Impaired fasting glucose: Secondary | ICD-10-CM | POA: Diagnosis not present

## 2018-02-23 DIAGNOSIS — I2510 Atherosclerotic heart disease of native coronary artery without angina pectoris: Secondary | ICD-10-CM

## 2018-02-23 DIAGNOSIS — E538 Deficiency of other specified B group vitamins: Secondary | ICD-10-CM

## 2018-02-23 DIAGNOSIS — Z0000 Encounter for general adult medical examination without abnormal findings: Secondary | ICD-10-CM

## 2018-02-23 DIAGNOSIS — Z125 Encounter for screening for malignant neoplasm of prostate: Secondary | ICD-10-CM

## 2018-02-23 DIAGNOSIS — N200 Calculus of kidney: Secondary | ICD-10-CM

## 2018-02-23 LAB — UA/M W/RFLX CULTURE, ROUTINE
Bilirubin, UA: NEGATIVE
Glucose, UA: NEGATIVE
Ketones, UA: NEGATIVE
LEUKOCYTES UA: NEGATIVE
NITRITE UA: NEGATIVE
PH UA: 5.5 (ref 5.0–7.5)
Protein, UA: NEGATIVE
RBC, UA: NEGATIVE
Specific Gravity, UA: 1.025 (ref 1.005–1.030)
Urobilinogen, Ur: 0.2 mg/dL (ref 0.2–1.0)

## 2018-02-23 LAB — MICROALBUMIN, URINE WAIVED
Creatinine, Urine Waived: 200 mg/dL (ref 10–300)
Microalb, Ur Waived: 30 mg/L — ABNORMAL HIGH (ref 0–19)
Microalb/Creat Ratio: <30 mg/g (ref <30)

## 2018-02-23 LAB — BAYER DCA HB A1C WAIVED: HB A1C: 6.3 % (ref <7.0)

## 2018-02-23 NOTE — Assessment & Plan Note (Signed)
No chest pain. Is having near syncopal episodes. Will get him back into cardiology. Appointment scheduled today.

## 2018-02-23 NOTE — Assessment & Plan Note (Signed)
Rechecking levels. Await results. Call with any concerns.

## 2018-02-23 NOTE — Assessment & Plan Note (Signed)
Slightly elevated today, but having near syncopal episodes. Will hold on changing medicine until he sees cardiology.

## 2018-02-23 NOTE — Progress Notes (Signed)
BP (!) 151/86 (BP Location: Left Arm, Patient Position: Sitting, Cuff Size: Normal)   Pulse 72   Temp 98.2 F (36.8 C)   Ht 5' 4.5" (1.638 m)   Wt 172 lb 1 oz (78 kg)   SpO2 98%   BMI 29.08 kg/m    Subjective:    Patient ID: Nathan Fields, male    DOB: Sep 11, 1950, 67 y.o.   MRN: [id number]  HPI: Nathan Fields is a 67 y.o. male presenting on 02/23/2018 for comprehensive medical examination. Current medical complaints include:  Has been having some issues with near syncope. He states that it comes on out of the blue. He states that he gets very dizzy and he feels like he is going to pass out. He states that it has happened a couple of times since his last appointment.   Arm pain is getting better. Has been doing stuff on his own. Has been getting better.   HYPERTENSION / HYPERLIPIDEMIA Satisfied with current treatment? yes Duration of hypertension: chronic BP monitoring frequency: not checking BP medication side effects: no Past BP meds: amlodipine, valsartan Duration of hyperlipidemia: chronic Cholesterol medication side effects: no Cholesterol supplements: none Past cholesterol medications: crestor Medication compliance: fair compliance Aspirin: yes Recent stressors: no Recurrent headaches: no Visual changes: no Palpitations: no Dyspnea: no Chest pain: no Lower extremity edema: no Dizzy/lightheaded: no  He currently lives with: wife  Functional Status Survey: Is the patient deaf or have difficulty hearing?: No Does the patient have difficulty seeing, even when wearing glasses/contacts?: No Does the patient have difficulty concentrating, remembering, or making decisions?: No Does the patient have difficulty walking or climbing stairs?: No Does the patient have difficulty dressing or bathing?: No Does the patient have difficulty doing errands alone such as visiting a doctor's office or shopping?: No  FALL RISK: Fall Risk  02/23/2018 09/15/2017 08/08/2017 02/10/2017  11/11/2016  Falls in the past year? No No No No No    Depression Screen Depression screen [facility] 2/9 02/23/2018 08/08/2017 02/10/2017 10/08/2016 09/23/2016  Decreased Interest 0 0 0 0 0  Down, Depressed, Hopeless 0 0 0 0 0  PHQ - 2 Score 0 0 0 0 0  Altered sleeping 0 2 1 - -  Tired, decreased energy 0 2 1 - -  Change in appetite 3 3 0 - -  Feeling bad or failure about yourself  0 0 0 - -  Trouble concentrating 0 0 0 - -  Moving slowly or fidgety/restless 1 1 0 - -  Suicidal thoughts 0 0 0 - -  PHQ-9 Score 4 8 2  - -  Difficult doing work/chores Not difficult at all - - - -    Advanced Directives Wants his wife to make his decision  Past Medical History:  Past Medical History:  Diagnosis Date  . Arthritis    right foot  . CAD (coronary artery disease)   . Chronic low back pain   . Diabetes mellitus without complication ([place])   . Diabetes mellitus without complication ([place])   . GERD (gastroesophageal reflux disease)   . Hyperlipidemia   . Hypertension   . Impaired fasting glucose   . Neuromuscular disorder ([place])   . Plantar fasciitis   . Syncope     Surgical History:  Past Surgical History:  Procedure Laterality Date  . CARDIAC CATHETERIZATION    . COLONOSCOPY  2011   Polyp removed, follow up in 5 years  . COLONOSCOPY WITH PROPOFOL N/A 01/14/2017  Procedure: COLONOSCOPY WITH PROPOFOL;  Surgeon: [name], MD;  Location: [facility] ENDOSCOPY;  Service: Endoscopy;  Laterality: N/A;    Medications:  Current Outpatient Medications on File Prior to Visit  Medication Sig  . Cyanocobalamin (VITAMIN B 12 PO) Take 1,000 mcg by mouth daily.  Marland Kitchen amLODipine (NORVASC) 10 MG tablet TAKE 1 TABLET BY MOUTH EVERY DAY  . aspirin 81 MG tablet Take 81 mg by mouth daily.  . valsartan (DIOVAN) 80 MG tablet TAKE 1 TABLET BY MOUTH EVERY DAY   No current facility-administered medications on file prior to visit.     Allergies:  Allergies  Allergen Reactions  . Statins Other (See Comments)     fatigue fatigue  . Zetia [Ezetimibe] Other (See Comments)    Constipation  . Niacin Rash  . Niacin And Related Rash    Social History:  Social History   Socioeconomic History  . Marital status: Married    Spouse name: Not on file  . Number of children: 2  . Years of education: 9th grade  . Highest education level: Not on file  Occupational History  . Not on file  Social Needs  . Financial resource strain: Not on file  . Food insecurity:    Worry: Not on file    Inability: Not on file  . Transportation needs:    Medical: Not on file    Non-medical: Not on file  Tobacco Use  . Smoking status: Never Smoker  . Smokeless tobacco: Never Used  Substance and Sexual Activity  . Alcohol use: Yes    Alcohol/week: 0.0 standard drinks    Comment: on rare occasion  . Drug use: No  . Sexual activity: Not Currently  Lifestyle  . Physical activity:    Days per week: Not on file    Minutes per session: Not on file  . Stress: Not on file  Relationships  . Social connections:    Talks on phone: Not on file    Gets together: Not on file    Attends religious service: Not on file    Active member of club or organization: Not on file    Attends meetings of clubs or organizations: Not on file    Relationship status: Not on file  . Intimate partner violence:    Fear of current or ex partner: Not on file    Emotionally abused: Not on file    Physically abused: Not on file    Forced sexual activity: Not on file  Other Topics Concern  . Not on file  Social History Narrative  . Not on file   Social History   Tobacco Use  Smoking Status Never Smoker  Smokeless Tobacco Never Used   Social History   Substance and Sexual Activity  Alcohol Use Yes  . Alcohol/week: 0.0 standard drinks   Comment: on rare occasion    Family History:  Family History  Problem Relation Age of Onset  . Heart disease Mother   . Hyperlipidemia Mother   . Diabetes Mother   . Congestive Heart Failure  Mother   . Diabetes Father   . Heart attack Father   . Hyperlipidemia Father     Past medical history, surgical history, medications, allergies, family history and social history reviewed with patient today and changes made to appropriate areas of the chart.   Review of Systems  Constitutional: Negative.   HENT: Negative.   Eyes: Negative.   Respiratory: Negative.   Cardiovascular: Negative.   Gastrointestinal:  Negative.   Genitourinary: Negative.   Musculoskeletal: Positive for myalgias. Negative for back pain, falls, joint pain and neck pain.  Skin: Negative.   Neurological: Positive for dizziness and weakness. Negative for tingling, tremors, sensory change, speech change, focal weakness, seizures, loss of consciousness and headaches.  Endo/Heme/Allergies: Negative.   Psychiatric/Behavioral: Negative.     All other ROS negative except what is listed above and in the HPI.      Objective:    BP (!) 151/86 (BP Location: Left Arm, Patient Position: Sitting, Cuff Size: Normal)   Pulse 72   Temp 98.2 F (36.8 C)   Ht 5' 4.5" (1.638 m)   Wt 172 lb 1 oz (78 kg)   SpO2 98%   BMI 29.08 kg/m   Wt Readings from Last 3 Encounters:  02/23/18 172 lb 1 oz (78 kg)  11/27/17 188 lb (85.3 kg)  09/15/17 172 lb 14.4 oz (78.4 kg)    Physical Exam  Constitutional: He is oriented to person, place, and time. He appears well-developed and well-nourished. No distress.  HENT:  Head: Normocephalic and atraumatic.  Right Ear: Hearing, tympanic membrane, external ear and ear canal normal.  Left Ear: Hearing, tympanic membrane, external ear and ear canal normal.  Nose: Nose normal.  Mouth/Throat: Uvula is midline, oropharynx is clear and moist and mucous membranes are normal. No oropharyngeal exudate.  Eyes: Pupils are equal, round, and reactive to light. Conjunctivae, EOM and lids are normal. Right eye exhibits no discharge. Left eye exhibits no discharge. No scleral icterus.  Neck: Normal  range of motion. Neck supple. No JVD present. No tracheal deviation present. No thyromegaly present.  Cardiovascular: Normal rate, regular rhythm, normal heart sounds and intact distal pulses. Exam reveals no gallop and no friction rub.  No murmur heard. Pulmonary/Chest: Effort normal and breath sounds normal. No stridor. No respiratory distress. He has no wheezes. He has no rales. He exhibits no tenderness.  Abdominal: Soft. Bowel sounds are normal. He exhibits no distension and no mass. There is no tenderness. There is no rebound and no guarding. No hernia.  Genitourinary:  Genitourinary Comments: Penis and prostate exams deferred today with shared decision making  Musculoskeletal: Normal range of motion. He exhibits no edema, tenderness or deformity.  Lymphadenopathy:    He has no cervical adenopathy.  Neurological: He is alert and oriented to person, place, and time. He displays normal reflexes. A sensory deficit (weak and decreased sensation R leg) is present. No cranial nerve deficit. He exhibits normal muscle tone. Coordination normal.  Skin: Skin is warm, dry and intact. Capillary refill takes less than 2 seconds. No rash noted. He is not diaphoretic. No erythema. No pallor.  Psychiatric: He has a normal mood and affect. His speech is normal and behavior is normal. Judgment and thought content normal. Cognition and memory are normal.  Nursing note and vitals reviewed.   6CIT Screen 02/23/2018 10/08/2016  What Year? 0 points 0 points  What month? 3 points 3 points  What time? 3 points 0 points  Count back from 20 0 points 0 points  Months in reverse 0 points 0 points  Repeat phrase 4 points 4 points  Total Score 10 7     Results for orders placed or performed in visit on 65/99/35  Basic metabolic panel  Result Value Ref Range   Glucose 87 65 - 99 mg/dL   BUN 15 8 - 27 mg/dL   Creatinine, Ser 1.07 0.76 - 1.27 mg/dL  GFR calc non Af Amer 72 >59 mL/min/1.73   GFR calc Af Amer 84  >59 mL/min/1.73   BUN/Creatinine Ratio 14 10 - 24   Sodium 143 134 - 144 mmol/L   Potassium 3.9 3.5 - 5.2 mmol/L   Chloride 102 96 - 106 mmol/L   CO2 23 20 - 29 mmol/L   Calcium 9.3 8.6 - 10.2 mg/dL      Assessment & Plan:   Problem List Items Addressed This Visit      Cardiovascular and Mediastinum   CAD (coronary artery disease)    No chest pain. Is having near syncopal episodes. Will get him back into cardiology. Appointment scheduled today.      Relevant Orders   CBC with Differential/Platelet   Comprehensive metabolic panel   TSH   UA/M w/rflx Culture, Routine   Hypertension    Slightly elevated today, but having near syncopal episodes. Will hold on changing medicine until he sees cardiology.      Relevant Orders   CBC with Differential/Platelet   Comprehensive metabolic panel   Microalbumin, Urine Waived   TSH   UA/M w/rflx Culture, Routine     Endocrine   Impaired fasting glucose    Rechecking levels. Await results. Call with any concerns.       Relevant Orders   CBC with Differential/Platelet   Bayer DCA Hb A1c Waived   Comprehensive metabolic panel   TSH   UA/M w/rflx Culture, Routine     Genitourinary   Nephrolithiasis    No pain. Rechecking urine today. Await results.       Relevant Orders   TSH   UA/M w/rflx Culture, Routine     Other   Hyperlipidemia    Under good control on current regimen. Continue current regimen. Continue to monitor. Call with any concerns. Refills given.        Relevant Orders   Lipid Panel w/o Chol/HDL Ratio   TSH   UA/M w/rflx Culture, Routine   B12 deficiency    Under good control on current regimen. Continue current regimen. Continue to monitor. Call with any concerns. Refills given.        Relevant Orders   CBC with Differential/Platelet   TSH   UA/M w/rflx Culture, Routine    Other Visit Diagnoses    Wellness examination    -  Primary   Preventative care discussed today as below. Call with any  concerns.    Routine general medical examination at a health care facility       Vaccines declined. Screening labs drawn today. Colonoscopy up to date. Continue diet and exercise. Call with any concerns.    Near syncope       Relevant Orders   EKG 12-Lead (Completed)   CBC with Differential/Platelet   Comprehensive metabolic panel   TSH   UA/M w/rflx Culture, Routine   Pain of left upper extremity       Relevant Orders   EKG 12-Lead (Completed)   CBC with Differential/Platelet   Comprehensive metabolic panel   TSH   UA/M w/rflx Culture, Routine   Screening for prostate cancer       Labs drawn today. Await results.    Relevant Orders   PSA   Vasovagal near syncope       Has continued. No better. Will get him back into see cardiology. Appointment with his cardiologist made today.       Preventative Services:  Health Risk Assessment and Personalized Prevention Plan: Done  today Bone Mass Measurements: N/A CVD Screening: Done today Colon Cancer Screening: Up to date Depression Screening: Done today Diabetes Screening: Done today Glaucoma Screening: See your Eye Doctor Hepatitis B vaccine: N/A Hepatitis C screening: Up to date HIV Screening: up to date Flu Vaccine: Get next month Lung cancer Screening: N/A Obesity Screening: Done today Pneumonia Vaccines (2): Declined STI Screening: N/A PSA screening: Done today  Discussed aspirin prophylaxis for myocardial infarction prevention and decision was made to continue ASA  LABORATORY TESTING:  Health maintenance labs ordered today as discussed above.   The natural history of prostate cancer and ongoing controversy regarding screening and potential treatment outcomes of prostate cancer has been discussed with the patient. The meaning of a false positive PSA and a false negative PSA has been discussed. He indicates understanding of the limitations of this screening test and wishes to proceed with screening PSA  testing.   IMMUNIZATIONS:   - Tdap: Tetanus vaccination status reviewed: Refused. - Influenza: Refused - Pneumovax: Refused - Prevnar: Refused - Zostavax vaccine: Refused  SCREENING: - Colonoscopy: Up to date  Discussed with patient purpose of the colonoscopy is to detect colon cancer at curable precancerous or early stages   PATIENT COUNSELING:    Sexuality: Discussed sexually transmitted diseases, partner selection, use of condoms, avoidance of unintended pregnancy  and contraceptive alternatives.   Advised to avoid cigarette smoking.  I discussed with the patient that most people either abstain from alcohol or drink within safe limits (<=14/week and <=4 drinks/occasion for males, <=7/weeks and <= 3 drinks/occasion for females) and that the risk for alcohol disorders and other health effects rises proportionally with the number of drinks per week and how often a drinker exceeds daily limits.  Discussed cessation/primary prevention of drug use and availability of treatment for abuse.   Diet: Encouraged to adjust caloric intake to maintain  or achieve ideal body weight, to reduce intake of dietary saturated fat and total fat, to limit sodium intake by avoiding high sodium foods and not adding table salt, and to maintain adequate dietary potassium and calcium preferably from fresh fruits, vegetables, and low-fat dairy products.    stressed the importance of regular exercise  Injury prevention: Discussed safety belts, safety helmets, smoke detector, smoking near bedding or upholstery.   Dental health: Discussed importance of regular tooth brushing, flossing, and dental visits.   Follow up plan: NEXT PREVENTATIVE PHYSICAL DUE IN 1 YEAR. Return in about 6 months (around 08/26/2018).

## 2018-02-23 NOTE — Patient Instructions (Addendum)
Preventative Services:  Health Risk Assessment and Personalized Prevention Plan: Done today Bone Mass Measurements: N/A CVD Screening: Done today Colon Cancer Screening: Up to date Depression Screening: Done today Diabetes Screening: Done today Glaucoma Screening: See your Eye Doctor Hepatitis B vaccine: N/A Hepatitis C screening: Up to date HIV Screening: up to date Flu Vaccine: Get next month Lung cancer Screening: N/A Obesity Screening: Done today Pneumonia Vaccines (2): Declined STI Screening: N/A PSA screening: Done today   Health Maintenance, Male A healthy lifestyle and preventive care is important for your health and wellness. Ask your health care provider about what schedule of regular examinations is right for you. What should I know about weight and diet? Eat a Healthy Diet  Eat plenty of vegetables, fruits, whole grains, low-fat dairy products, and lean protein.  Do not eat a lot of foods high in solid fats, added sugars, or salt.  Maintain a Healthy Weight Regular exercise can help you achieve or maintain a healthy weight. You should:  Do at least 150 minutes of exercise each week. The exercise should increase your heart rate and make you sweat (moderate-intensity exercise).  Do strength-training exercises at least twice a week.  Watch Your Levels of Cholesterol and Blood Lipids  Have your blood tested for lipids and cholesterol every 5 years starting at 66 years of age. If you are at high risk for heart disease, you should start having your blood tested when you are 66 years old. You may need to have your cholesterol levels checked more often if: ? Your lipid or cholesterol levels are high. ? You are older than 66 years of age. ? You are at high risk for heart disease.  What should I know about cancer screening? Many types of cancers can be detected early and may often be prevented. Lung Cancer  You should be screened every year for lung cancer if: ? You  are a current smoker who has smoked for at least 30 years. ? You are a former smoker who has quit within the past 15 years.  Talk to your health care provider about your screening options, when you should start screening, and how often you should be screened.  Colorectal Cancer  Routine colorectal cancer screening usually begins at 66 years of age and should be repeated every 5-10 years until you are 66 years old. You may need to be screened more often if early forms of precancerous polyps or small growths are found. Your health care provider may recommend screening at an earlier age if you have risk factors for colon cancer.  Your health care provider may recommend using home test kits to check for hidden blood in the stool.  A small camera at the end of a tube can be used to examine your colon (sigmoidoscopy or colonoscopy). This checks for the earliest forms of colorectal cancer.  Prostate and Testicular Cancer  Depending on your age and overall health, your health care provider may do certain tests to screen for prostate and testicular cancer.  Talk to your health care provider about any symptoms or concerns you have about testicular or prostate cancer.  Skin Cancer  Check your skin from head to toe regularly.  Tell your health care provider about any new moles or changes in moles, especially if: ? There is a change in a mole's size, shape, or color. ? You have a mole that is larger than a pencil eraser.  Always use sunscreen. Apply sunscreen liberally and repeat throughout  the day.  Protect yourself by wearing long sleeves, pants, a wide-brimmed hat, and sunglasses when outside.  What should I know about heart disease, diabetes, and high blood pressure?  If you are 80-47 years of age, have your blood pressure checked every 3-5 years. If you are 52 years of age or older, have your blood pressure checked every year. You should have your blood pressure measured twice-once when you  are at a hospital or clinic, and once when you are not at a hospital or clinic. Record the average of the two measurements. To check your blood pressure when you are not at a hospital or clinic, you can use: ? An automated blood pressure machine at a pharmacy. ? A home blood pressure monitor.  Talk to your health care provider about your target blood pressure.  If you are between 62-20 years old, ask your health care provider if you should take aspirin to prevent heart disease.  Have regular diabetes screenings by checking your fasting blood sugar level. ? If you are at a normal weight and have a low risk for diabetes, have this test once every three years after the age of 51. ? If you are overweight and have a high risk for diabetes, consider being tested at a younger age or more often.  A one-time screening for abdominal aortic aneurysm (AAA) by ultrasound is recommended for men aged 69-75 years who are current or former smokers. What should I know about preventing infection? Hepatitis B If you have a higher risk for hepatitis B, you should be screened for this virus. Talk with your health care provider to find out if you are at risk for hepatitis B infection. Hepatitis C Blood testing is recommended for:  Everyone born from [birth year] through 1964.  Anyone with known risk factors for hepatitis C.  Sexually Transmitted Diseases (STDs)  You should be screened each year for STDs including gonorrhea and chlamydia if: ? You are sexually active and are younger than 66 years of age. ? You are older than 66 years of age and your health care provider tells you that you are at risk for this type of infection. ? Your sexual activity has changed since you were last screened and you are at an increased risk for chlamydia or gonorrhea. Ask your health care provider if you are at risk.  Talk with your health care provider about whether you are at high risk of being infected with HIV. Your health care  provider may recommend a prescription medicine to help prevent HIV infection.  What else can I do?  Schedule regular health, dental, and eye exams.  Stay current with your vaccines (immunizations).  Do not use any tobacco products, such as cigarettes, chewing tobacco, and e-cigarettes. If you need help quitting, ask your health care provider.  Limit alcohol intake to no more than 2 drinks per day. One drink equals 12 ounces of beer, 5 ounces of wine, or 1 ounces of hard liquor.  Do not use street drugs.  Do not share needles.  Ask your health care provider for help if you need support or information about quitting drugs.  Tell your health care provider if you often feel depressed.  Tell your health care provider if you have ever been abused or do not feel safe at home. This information is not intended to replace advice given to you by your health care provider. Make sure you discuss any questions you have with your health care provider. Document  Released: 12/14/2007 Document Revised: 02/14/2016 Document Reviewed: 03/21/2015 Elsevier Interactive Patient Education  [employer].

## 2018-02-23 NOTE — Assessment & Plan Note (Signed)
Under good control on current regimen. Continue current regimen. Continue to monitor. Call with any concerns. Refills given.   

## 2018-02-23 NOTE — Assessment & Plan Note (Signed)
No pain. Rechecking urine today. Await results.

## 2018-02-24 ENCOUNTER — Other Ambulatory Visit: Payer: Self-pay | Admitting: Family Medicine

## 2018-02-24 LAB — CBC WITH DIFFERENTIAL/PLATELET
BASOS ABS: 0 10*3/uL (ref 0.0–0.2)
BASOS: 1 %
EOS (ABSOLUTE): 0.1 10*3/uL (ref 0.0–0.4)
EOS: 2 %
HEMATOCRIT: 43.2 % (ref 37.5–51.0)
HEMOGLOBIN: 14.4 g/dL (ref 13.0–17.7)
Immature Grans (Abs): 0 10*3/uL (ref 0.0–0.1)
Immature Granulocytes: 0 %
LYMPHS ABS: 2.3 10*3/uL (ref 0.7–3.1)
LYMPHS: 38 %
MCH: 30.1 pg (ref 26.6–33.0)
MCHC: 33.3 g/dL (ref 31.5–35.7)
MCV: 90 fL (ref 79–97)
MONOCYTES: 10 %
Monocytes Absolute: 0.6 10*3/uL (ref 0.1–0.9)
NEUTROS ABS: 3.1 10*3/uL (ref 1.4–7.0)
Neutrophils: 49 %
Platelets: 256 10*3/uL (ref 150–450)
RBC: 4.79 x10E6/uL (ref 4.14–5.80)
RDW: 14.4 % (ref 12.3–15.4)
WBC: 6.2 10*3/uL (ref 3.4–10.8)

## 2018-02-24 LAB — COMPREHENSIVE METABOLIC PANEL
ALBUMIN: 4.6 g/dL (ref 3.6–4.8)
ALK PHOS: 78 IU/L (ref 39–117)
ALT: 19 IU/L (ref 0–44)
AST: 12 IU/L (ref 0–40)
Albumin/Globulin Ratio: 2 (ref 1.2–2.2)
BILIRUBIN TOTAL: 0.3 mg/dL (ref 0.0–1.2)
BUN / CREAT RATIO: 20 (ref 10–24)
BUN: 21 mg/dL (ref 8–27)
CHLORIDE: 103 mmol/L (ref 96–106)
CO2: 21 mmol/L (ref 20–29)
CREATININE: 1.07 mg/dL (ref 0.76–1.27)
Calcium: 9.6 mg/dL (ref 8.6–10.2)
GFR calc Af Amer: 83 mL/min/{1.73_m2} (ref >59)
GFR calc non Af Amer: 72 mL/min/{1.73_m2} (ref >59)
GLOBULIN, TOTAL: 2.3 g/dL (ref 1.5–4.5)
GLUCOSE: 105 mg/dL — AB (ref 65–99)
Potassium: 4.4 mmol/L (ref 3.5–5.2)
SODIUM: 141 mmol/L (ref 134–144)
TOTAL PROTEIN: 6.9 g/dL (ref 6.0–8.5)

## 2018-02-24 LAB — TSH: TSH: 1.91 u[IU]/mL (ref 0.450–4.500)

## 2018-02-24 LAB — LIPID PANEL W/O CHOL/HDL RATIO
Cholesterol, Total: 329 mg/dL — ABNORMAL HIGH (ref 100–199)
HDL: 43 mg/dL (ref >39)
LDL CALC: 229 mg/dL — AB (ref 0–99)
Triglycerides: 287 mg/dL — ABNORMAL HIGH (ref 0–149)
VLDL CHOLESTEROL CAL: 57 mg/dL — AB (ref 5–40)

## 2018-02-24 LAB — PSA: Prostate Specific Ag, Serum: 1.7 ng/mL (ref 0.0–4.0)

## 2018-02-24 MED ORDER — ROSUVASTATIN CALCIUM 5 MG PO TABS: 5.0000 mg | ORAL_TABLET | ORAL | 3 refills | Status: DC

## 2018-03-18 ENCOUNTER — Other Ambulatory Visit: Payer: Self-pay | Admitting: Family Medicine

## 2018-05-22 ENCOUNTER — Ambulatory Visit (INDEPENDENT_AMBULATORY_CARE_PROVIDER_SITE_OTHER): Payer: Medicare Other

## 2018-05-22 DIAGNOSIS — Z23 Encounter for immunization: Secondary | ICD-10-CM

## 2018-05-25 ENCOUNTER — Other Ambulatory Visit: Payer: Self-pay | Admitting: Family Medicine

## 2018-05-26 NOTE — Telephone Encounter (Signed)
Requested Prescriptions  Pending Prescriptions Disp Refills  . amLODipine (NORVASC) 10 MG tablet [Pharmacy Med Name: AMLODIPINE BESYLATE 10 MG TAB] 90 tablet 0    Sig: TAKE 1 TABLET BY MOUTH EVERY DAY     Cardiovascular:  Calcium Channel Blockers Failed - 05/25/2018  2:16 AM      Failed - Last BP in normal range    BP Readings from Last 1 Encounters:  02/23/18 (!) 151/86         Passed - Valid encounter within last 6 months    Recent Outpatient Visits          3 months ago Wellness examination   [facility], Megan P, DO   6 months ago Rupture of right proximal biceps tendon, initial encounter   [facility], Megan P, DO   8 months ago Essential hypertension   [place], [place], DO   9 months ago Essential hypertension   [place], Megan P, DO   9 months ago Chest pain, unspecified type   [place], Barb Merino, DO      Future Appointments            In 3 months Wynetta Emery, Barb Merino, DO [employer]

## 2018-07-14 ENCOUNTER — Ambulatory Visit (INDEPENDENT_AMBULATORY_CARE_PROVIDER_SITE_OTHER): Payer: Medicare Other | Admitting: Family Medicine

## 2018-07-14 ENCOUNTER — Encounter: Payer: Self-pay | Admitting: Family Medicine

## 2018-07-14 VITALS — BP 138/82 | HR 80 | Temp 97.8°F | Ht 64.5 in | Wt 165.0 lb

## 2018-07-14 DIAGNOSIS — I10 Essential (primary) hypertension: Secondary | ICD-10-CM | POA: Diagnosis not present

## 2018-07-14 DIAGNOSIS — I2510 Atherosclerotic heart disease of native coronary artery without angina pectoris: Secondary | ICD-10-CM

## 2018-07-14 DIAGNOSIS — E538 Deficiency of other specified B group vitamins: Secondary | ICD-10-CM

## 2018-07-14 DIAGNOSIS — Z8639 Personal history of other endocrine, nutritional and metabolic disease: Secondary | ICD-10-CM

## 2018-07-14 DIAGNOSIS — F332 Major depressive disorder, recurrent severe without psychotic features: Secondary | ICD-10-CM

## 2018-07-14 DIAGNOSIS — E782 Mixed hyperlipidemia: Secondary | ICD-10-CM

## 2018-07-14 DIAGNOSIS — R7301 Impaired fasting glucose: Secondary | ICD-10-CM

## 2018-07-14 LAB — BAYER DCA HB A1C WAIVED: HB A1C (BAYER DCA - WAIVED): 6.1 % (ref <7.0)

## 2018-07-14 MED ORDER — VALSARTAN 80 MG PO TABS: 80.0000 mg | ORAL_TABLET | Freq: Every day | ORAL | 1 refills | Status: DC

## 2018-07-14 MED ORDER — CYANOCOBALAMIN 1000 MCG/ML IJ SOLN
1000.0000 ug | Freq: Once | INTRAMUSCULAR | Status: AC
  Administered 2018-07-14: 1000 ug via INTRAMUSCULAR

## 2018-07-14 MED ORDER — AMLODIPINE BESYLATE 10 MG PO TABS: 10.0000 mg | ORAL_TABLET | Freq: Every day | ORAL | 1 refills | Status: DC

## 2018-07-14 MED ORDER — CYANOCOBALAMIN 1000 MCG/ML IJ SOLN
1000.0000 ug | INTRAMUSCULAR | Status: AC
  Administered 2018-08-14 – 2019-03-01 (×3): 1000 ug via INTRAMUSCULAR

## 2018-07-14 NOTE — Assessment & Plan Note (Signed)
Under good control on current regimen. Continue current regimen. Continue to monitor. Call with any concerns. Refills given. Labs drawn today.   

## 2018-07-14 NOTE — Assessment & Plan Note (Signed)
Stable with A1c of 6.1. Continue diet and exercise. Call with any concerns. Continue to monitor.

## 2018-07-14 NOTE — Assessment & Plan Note (Signed)
Will start B12 shots. 1st given today- may return in 1 month for B12 shot

## 2018-07-14 NOTE — Assessment & Plan Note (Signed)
Under good control on current regimen. Continue current regimen. Continue to monitor. Call with any concerns. Refills given. Labs checked today.  

## 2018-07-14 NOTE — Progress Notes (Signed)
BP 138/82   Pulse 80   Temp 97.8 F (36.6 C) (Oral)   Ht 5' 4.5" (1.638 m)   Wt 165 lb (74.8 kg)   SpO2 100%   BMI 27.88 kg/m    Subjective:    Patient ID: Nathan Fields, male    DOB: 08-20-1950, 68 y.o.   MRN: [id number]  HPI: Nathan Fields is a 68 y.o. male  Chief Complaint  Patient presents with  . Follow-up  . Fatigue    Would like B12 checked  . Medication Refill    Was taking Valsartin Daily vs BID as prescribed now out due to this   HYPERTENSION / [place] Satisfied with current treatment? yes Duration of hypertension: chronic BP monitoring frequency: not checking BP medication side effects: no Past BP meds: amlodipine, valsartan Duration of hyperlipidemia: chronic Cholesterol medication side effects: no Cholesterol supplements: none Past cholesterol medications: crestor Medication compliance: good compliance Aspirin: yes Recent stressors: no Recurrent headaches: no Visual changes: no Palpitations: no Dyspnea: no Chest pain: no Lower extremity edema: no Dizzy/lightheaded: no  Impaired Fasting Glucose HbA1C:  Lab Results  Component Value Date   HGBA1C 6.3 02/23/2018   Duration of elevated blood sugar: chronic Polydipsia: no Polyuria: no Weight change: no Visual disturbance: no Glucose Monitoring: no Family history of diabetes: yes  DEPRESSION Mood status: stable Satisfied with current treatment?: yes Symptom severity: moderate  Duration of current treatment : Not on anything Side effects: no Medication compliance: Not on anything Psychotherapy/counseling: no  Depressed mood: yes Anxious mood: yes Anhedonia: no Significant weight loss or gain: no Insomnia: no  Fatigue: yes Feelings of worthlessness or guilt: no Impaired concentration/indecisiveness: no Suicidal ideations: no Hopelessness: no Crying spells: no Depression screen [facility] 2/9 07/14/2018 02/23/2018 08/08/2017 02/10/2017 10/08/2016  Decreased Interest 0 0 0 0 0  Down,  Depressed, Hopeless 0 0 0 0 0  PHQ - 2 Score 0 0 0 0 0  Altered sleeping 0 0 2 1 -  Tired, decreased energy 3 0 2 1 -  Change in appetite 0 3 3 0 -  Feeling bad or failure about yourself  0 0 0 0 -  Trouble concentrating 0 0 0 0 -  Moving slowly or fidgety/restless 0 1 1 0 -  Suicidal thoughts 0 0 0 0 -  PHQ-9 Score 3 4 8 2  -  Difficult doing work/chores - Not difficult at all - - -     Relevant past medical, surgical, family and social history reviewed and updated as indicated. Interim medical history since our last visit reviewed. Allergies and medications reviewed and updated.  Review of Systems  Constitutional: Negative.   Respiratory: Negative.   Cardiovascular: Negative.   Psychiatric/Behavioral: Negative.     Per HPI unless specifically indicated above     Objective:    BP 138/82   Pulse 80   Temp 97.8 F (36.6 C) (Oral)   Ht 5' 4.5" (1.638 m)   Wt 165 lb (74.8 kg)   SpO2 100%   BMI 27.88 kg/m   Wt Readings from Last 3 Encounters:  07/14/18 165 lb (74.8 kg)  02/23/18 172 lb 1 oz (78 kg)  11/27/17 188 lb (85.3 kg)    Physical Exam Vitals signs and nursing note reviewed.  Constitutional:      General: He is not in acute distress.    Appearance: Normal appearance. He is not ill-appearing, toxic-appearing or diaphoretic.  HENT:     Head: Normocephalic and atraumatic.  Right Ear: External ear normal.     Left Ear: External ear normal.     Nose: Nose normal.     Mouth/Throat:     Mouth: Mucous membranes are moist.     Pharynx: Oropharynx is clear.  Eyes:     General: No scleral icterus.       Right eye: No discharge.        Left eye: No discharge.     Extraocular Movements: Extraocular movements intact.     Conjunctiva/sclera: Conjunctivae normal.     Pupils: Pupils are equal, round, and reactive to light.  Neck:     Musculoskeletal: Normal range of motion and neck supple.  Cardiovascular:     Rate and Rhythm: Normal rate and regular rhythm.      Pulses: Normal pulses.     Heart sounds: Normal heart sounds. No murmur. No friction rub. No gallop.   Pulmonary:     Effort: Pulmonary effort is normal. No respiratory distress.     Breath sounds: Normal breath sounds. No stridor. No wheezing, rhonchi or rales.  Chest:     Chest wall: No tenderness.  Musculoskeletal: Normal range of motion.  Skin:    General: Skin is warm and dry.     Capillary Refill: Capillary refill takes less than 2 seconds.     Coloration: Skin is not jaundiced or pale.     Findings: No bruising, erythema, lesion or rash.  Neurological:     General: No focal deficit present.     Mental Status: He is alert and oriented to person, place, and time. Mental status is at baseline.  Psychiatric:        Mood and Affect: Mood normal.        Behavior: Behavior normal.        Thought Content: Thought content normal.        Judgment: Judgment normal.     Results for orders placed or performed in visit on 02/23/18  CBC with Differential/Platelet  Result Value Ref Range   WBC 6.2 3.4 - 10.8 x10E3/uL   RBC 4.79 4.14 - 5.80 x10E6/uL   Hemoglobin 14.4 13.0 - 17.7 g/dL   Hematocrit 43.2 37.5 - 51.0 %   MCV 90 79 - 97 fL   MCH 30.1 26.6 - 33.0 pg   MCHC 33.3 31.5 - 35.7 g/dL   RDW 14.4 12.3 - 15.4 %   Platelets 256 150 - 450 x10E3/uL   Neutrophils 49 Not Estab. %   Lymphs 38 Not Estab. %   Monocytes 10 Not Estab. %   Eos 2 Not Estab. %   Basos 1 Not Estab. %   Neutrophils Absolute 3.1 1.4 - 7.0 x10E3/uL   Lymphocytes Absolute 2.3 0.7 - 3.1 x10E3/uL   Monocytes Absolute 0.6 0.1 - 0.9 x10E3/uL   EOS (ABSOLUTE) 0.1 0.0 - 0.4 x10E3/uL   Basophils Absolute 0.0 0.0 - 0.2 x10E3/uL   Immature Granulocytes 0 Not Estab. %   Immature Grans (Abs) 0.0 0.0 - 0.1 x10E3/uL  Bayer DCA Hb A1c Waived  Result Value Ref Range   HB A1C (BAYER DCA - WAIVED) 6.3 <7.0 %  Comprehensive metabolic panel  Result Value Ref Range   Glucose 105 (H) 65 - 99 mg/dL   BUN 21 8 - 27 mg/dL    Creatinine, Ser 1.07 0.76 - 1.27 mg/dL   GFR calc non Af Amer 72 >59 mL/min/1.73   GFR calc Af Amer 83 >59 mL/min/1.73   BUN/Creatinine Ratio  20 10 - 24   Sodium 141 134 - 144 mmol/L   Potassium 4.4 3.5 - 5.2 mmol/L   Chloride 103 96 - 106 mmol/L   CO2 21 20 - 29 mmol/L   Calcium 9.6 8.6 - 10.2 mg/dL   Total Protein 6.9 6.0 - 8.5 g/dL   Albumin 4.6 3.6 - 4.8 g/dL   Globulin, Total 2.3 1.5 - 4.5 g/dL   Albumin/Globulin Ratio 2.0 1.2 - 2.2   Bilirubin Total 0.3 0.0 - 1.2 mg/dL   Alkaline Phosphatase 78 39 - 117 IU/L   AST 12 0 - 40 IU/L   ALT 19 0 - 44 IU/L  Lipid Panel w/o Chol/HDL Ratio  Result Value Ref Range   Cholesterol, Total 329 (H) 100 - 199 mg/dL   Triglycerides 287 (H) 0 - 149 mg/dL   HDL 43 >39 mg/dL   VLDL Cholesterol Cal 57 (H) 5 - 40 mg/dL   LDL Calculated 229 (H) 0 - 99 mg/dL   Comment: Comment   Microalbumin, Urine Waived  Result Value Ref Range   Microalb, Ur Waived 30 (H) 0 - 19 mg/L   Creatinine, Urine Waived 200 10 - 300 mg/dL   Microalb/Creat Ratio <30 <30 mg/g  PSA  Result Value Ref Range   Prostate Specific Ag, Serum 1.7 0.0 - 4.0 ng/mL  TSH  Result Value Ref Range   TSH 1.910 0.450 - 4.500 uIU/mL  UA/M w/rflx Culture, Routine  Result Value Ref Range   Specific Gravity, UA 1.025 1.005 - 1.030   pH, UA 5.5 5.0 - 7.5   Color, UA Yellow Yellow   Appearance Ur Clear Clear   Leukocytes, UA Negative Negative   Protein, UA Negative Negative/Trace   Glucose, UA Negative Negative   Ketones, UA Negative Negative   RBC, UA Negative Negative   Bilirubin, UA Negative Negative   Urobilinogen, Ur 0.2 0.2 - 1.0 mg/dL   Nitrite, UA Negative Negative      Assessment & Plan:   Problem List Items Addressed This Visit      Cardiovascular and Mediastinum   CAD (coronary artery disease)    Referral placed earlier for him to go back to cardiology. He did not keep that appointment. Encouraged him to go to cardiology. He declined. Continue to keep BP, cholesterol  and sugars under good control. Continue to monitor.       Relevant Medications   amLODipine (NORVASC) 10 MG tablet   valsartan (DIOVAN) 80 MG tablet   Hypertension - Primary    Under good control on current regimen. Continue current regimen. Continue to monitor. Call with any concerns. Refills given. Labs checked today.       Relevant Medications   amLODipine (NORVASC) 10 MG tablet   valsartan (DIOVAN) 80 MG tablet   Other Relevant Orders   Comprehensive metabolic panel     Endocrine   Impaired fasting glucose    Stable with A1c of 6.1. Continue diet and exercise. Call with any concerns. Continue to monitor.       Relevant Orders   Bayer DCA Hb A1c Waived   Comprehensive metabolic panel     Other   Hyperlipidemia    Under good control on current regimen. Continue current regimen. Continue to monitor. Call with any concerns. Refills given. Labs drawn today.       Relevant Medications   amLODipine (NORVASC) 10 MG tablet   valsartan (DIOVAN) 80 MG tablet   Other Relevant Orders   Comprehensive metabolic  panel   Lipid Panel w/o Chol/HDL Ratio   Depression    Stable off medicine. Continue to monitor. Call with any concerns.       Relevant Orders   Comprehensive metabolic panel   M07 deficiency    Will start B12 shots. 1st given today- may return in 1 month for B12 shot       Other Visit Diagnoses    History of non anemic vitamin B12 deficiency       Relevant Medications   cyanocobalamin ((VITAMIN B-12)) injection 1,000 mcg (Completed)   cyanocobalamin ((VITAMIN B-12)) injection 1,000 mcg (Start on 08/14/2018 12:00 AM)       Follow up plan: Return in about 4 weeks (around 08/11/2018) for Nurse visit B12 shot, 6 months, wellness/physical with me.

## 2018-07-14 NOTE — Assessment & Plan Note (Signed)
Referral placed earlier for him to go back to cardiology. He did not keep that appointment. Encouraged him to go to cardiology. He declined. Continue to keep BP, cholesterol and sugars under good control. Continue to monitor.

## 2018-07-14 NOTE — Assessment & Plan Note (Signed)
Stable off medicine. Continue to monitor. Call with any concerns.

## 2018-07-15 ENCOUNTER — Encounter: Payer: Self-pay | Admitting: Family Medicine

## 2018-07-15 LAB — COMPREHENSIVE METABOLIC PANEL
ALBUMIN: 4.6 g/dL (ref 3.6–4.8)
ALT: 18 IU/L (ref 0–44)
AST: 16 IU/L (ref 0–40)
Albumin/Globulin Ratio: 1.8 (ref 1.2–2.2)
Alkaline Phosphatase: 97 IU/L (ref 39–117)
BUN / CREAT RATIO: 16 (ref 10–24)
BUN: 17 mg/dL (ref 8–27)
Bilirubin Total: 0.3 mg/dL (ref 0.0–1.2)
CALCIUM: 9.7 mg/dL (ref 8.6–10.2)
CO2: 23 mmol/L (ref 20–29)
CREATININE: 1.06 mg/dL (ref 0.76–1.27)
Chloride: 104 mmol/L (ref 96–106)
GFR, EST AFRICAN AMERICAN: 84 mL/min/{1.73_m2} (ref >59)
GFR, EST NON AFRICAN AMERICAN: 73 mL/min/{1.73_m2} (ref >59)
Globulin, Total: 2.5 g/dL (ref 1.5–4.5)
Glucose: 125 mg/dL — ABNORMAL HIGH (ref 65–99)
Potassium: 4.3 mmol/L (ref 3.5–5.2)
Sodium: 140 mmol/L (ref 134–144)
TOTAL PROTEIN: 7.1 g/dL (ref 6.0–8.5)

## 2018-07-15 LAB — LIPID PANEL W/O CHOL/HDL RATIO
Cholesterol, Total: 292 mg/dL — ABNORMAL HIGH (ref 100–199)
HDL: 43 mg/dL (ref >39)
LDL CALC: 219 mg/dL — AB (ref 0–99)
Triglycerides: 149 mg/dL (ref 0–149)
VLDL CHOLESTEROL CAL: 30 mg/dL (ref 5–40)

## 2018-08-14 ENCOUNTER — Ambulatory Visit (INDEPENDENT_AMBULATORY_CARE_PROVIDER_SITE_OTHER): Payer: Medicare Other

## 2018-08-14 DIAGNOSIS — E538 Deficiency of other specified B group vitamins: Secondary | ICD-10-CM | POA: Diagnosis not present

## 2018-08-14 DIAGNOSIS — Z8639 Personal history of other endocrine, nutritional and metabolic disease: Secondary | ICD-10-CM

## 2018-08-20 ENCOUNTER — Other Ambulatory Visit: Payer: Self-pay | Admitting: Family Medicine

## 2018-08-20 NOTE — Telephone Encounter (Signed)
Requested Prescriptions  Pending Prescriptions Disp Refills  . amLODipine (NORVASC) 10 MG tablet [Pharmacy Med Name: AMLODIPINE BESYLATE 10 MG TAB] 90 tablet 1    Sig: TAKE 1 TABLET BY MOUTH EVERY DAY     Cardiovascular:  Calcium Channel Blockers Passed - 08/20/2018  2:48 AM      Passed - Last BP in normal range    BP Readings from Last 1 Encounters:  07/14/18 138/82         Passed - Valid encounter within last 6 months    Recent Outpatient Visits          1 month ago Essential hypertension   [place], [place], DO   5 months ago Wellness examination   [employer], Megan P, DO   8 months ago Rupture of right proximal biceps tendon, initial encounter   [employer], Megan P, DO   11 months ago Essential hypertension   [employer], [place], DO   12 months ago Essential hypertension   [place], [place], DO      Future Appointments            In 4 months Wynetta Emery, Barb Merino, DO [employer], [facility]

## 2018-08-27 ENCOUNTER — Ambulatory Visit: Payer: Medicare Other | Admitting: Family Medicine

## 2019-01-12 ENCOUNTER — Encounter: Payer: Self-pay | Admitting: Family Medicine

## 2019-01-12 ENCOUNTER — Other Ambulatory Visit: Payer: Self-pay

## 2019-01-12 ENCOUNTER — Ambulatory Visit (INDEPENDENT_AMBULATORY_CARE_PROVIDER_SITE_OTHER): Payer: Medicare Other | Admitting: Family Medicine

## 2019-01-12 VITALS — BP 182/85 | HR 64 | Temp 98.3°F

## 2019-01-12 DIAGNOSIS — F332 Major depressive disorder, recurrent severe without psychotic features: Secondary | ICD-10-CM

## 2019-01-12 DIAGNOSIS — E782 Mixed hyperlipidemia: Secondary | ICD-10-CM

## 2019-01-12 DIAGNOSIS — I10 Essential (primary) hypertension: Secondary | ICD-10-CM | POA: Diagnosis not present

## 2019-01-12 DIAGNOSIS — E538 Deficiency of other specified B group vitamins: Secondary | ICD-10-CM

## 2019-01-12 DIAGNOSIS — Z8639 Personal history of other endocrine, nutritional and metabolic disease: Secondary | ICD-10-CM

## 2019-01-12 DIAGNOSIS — R7301 Impaired fasting glucose: Secondary | ICD-10-CM | POA: Diagnosis not present

## 2019-01-12 LAB — BAYER DCA HB A1C WAIVED: HB A1C (BAYER DCA - WAIVED): 6.1 % (ref <7.0)

## 2019-01-12 LAB — MICROALBUMIN, URINE WAIVED
Creatinine, Urine Waived: 50 mg/dL (ref 10–300)
Microalb, Ur Waived: 30 mg/L — ABNORMAL HIGH (ref 0–19)

## 2019-01-12 IMAGING — CR DG CHEST 2V
1 series · 2 of 2 positions shown · non-contrast
Comparison: 09/11/2016

CLINICAL DATA: Episodes of near-syncope.

EXAM:
CHEST  2 VIEW

[Series 1: dg chest 2 view · 0.14mm/px · 2 of 2 slices shown]
[im 1/2]
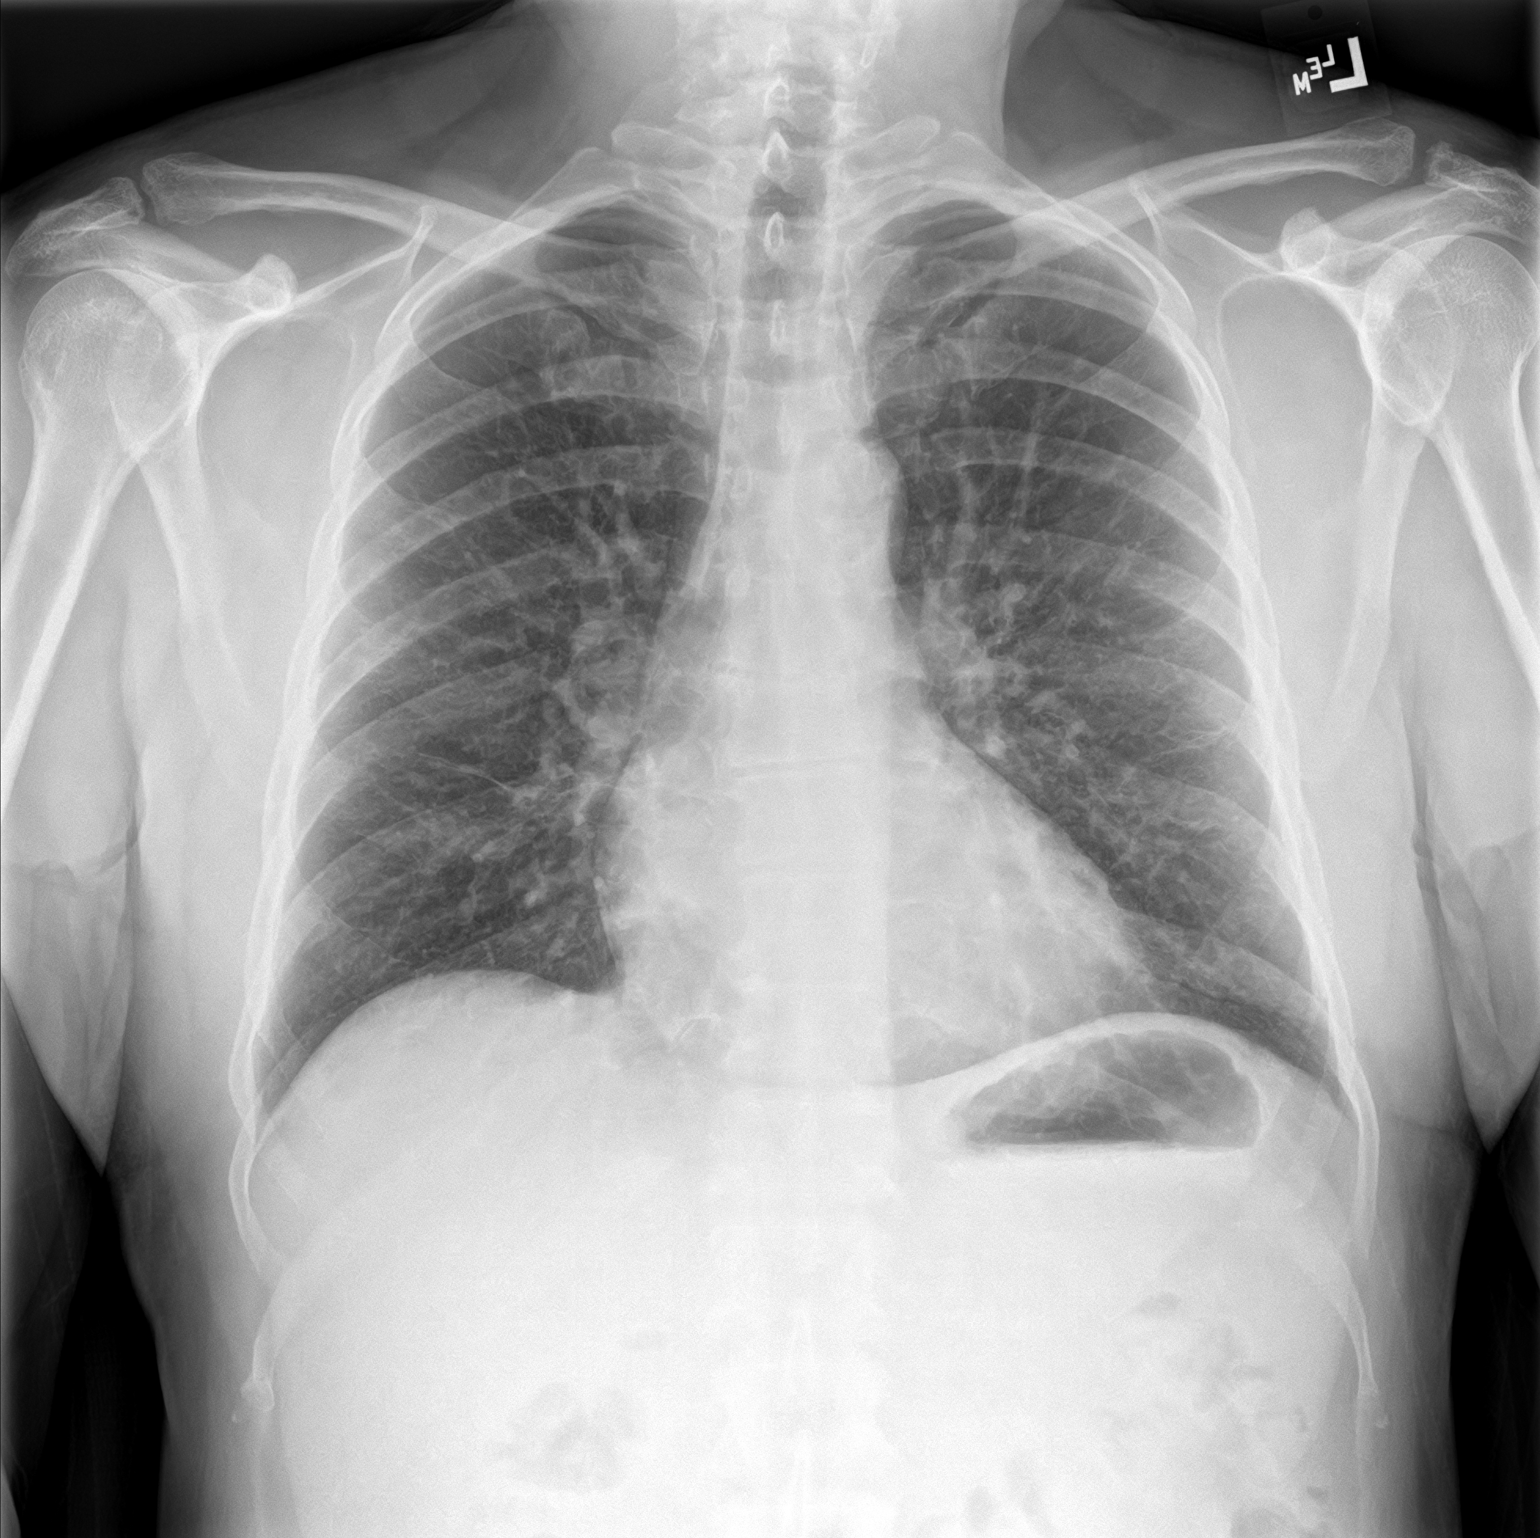
[im 2/2]
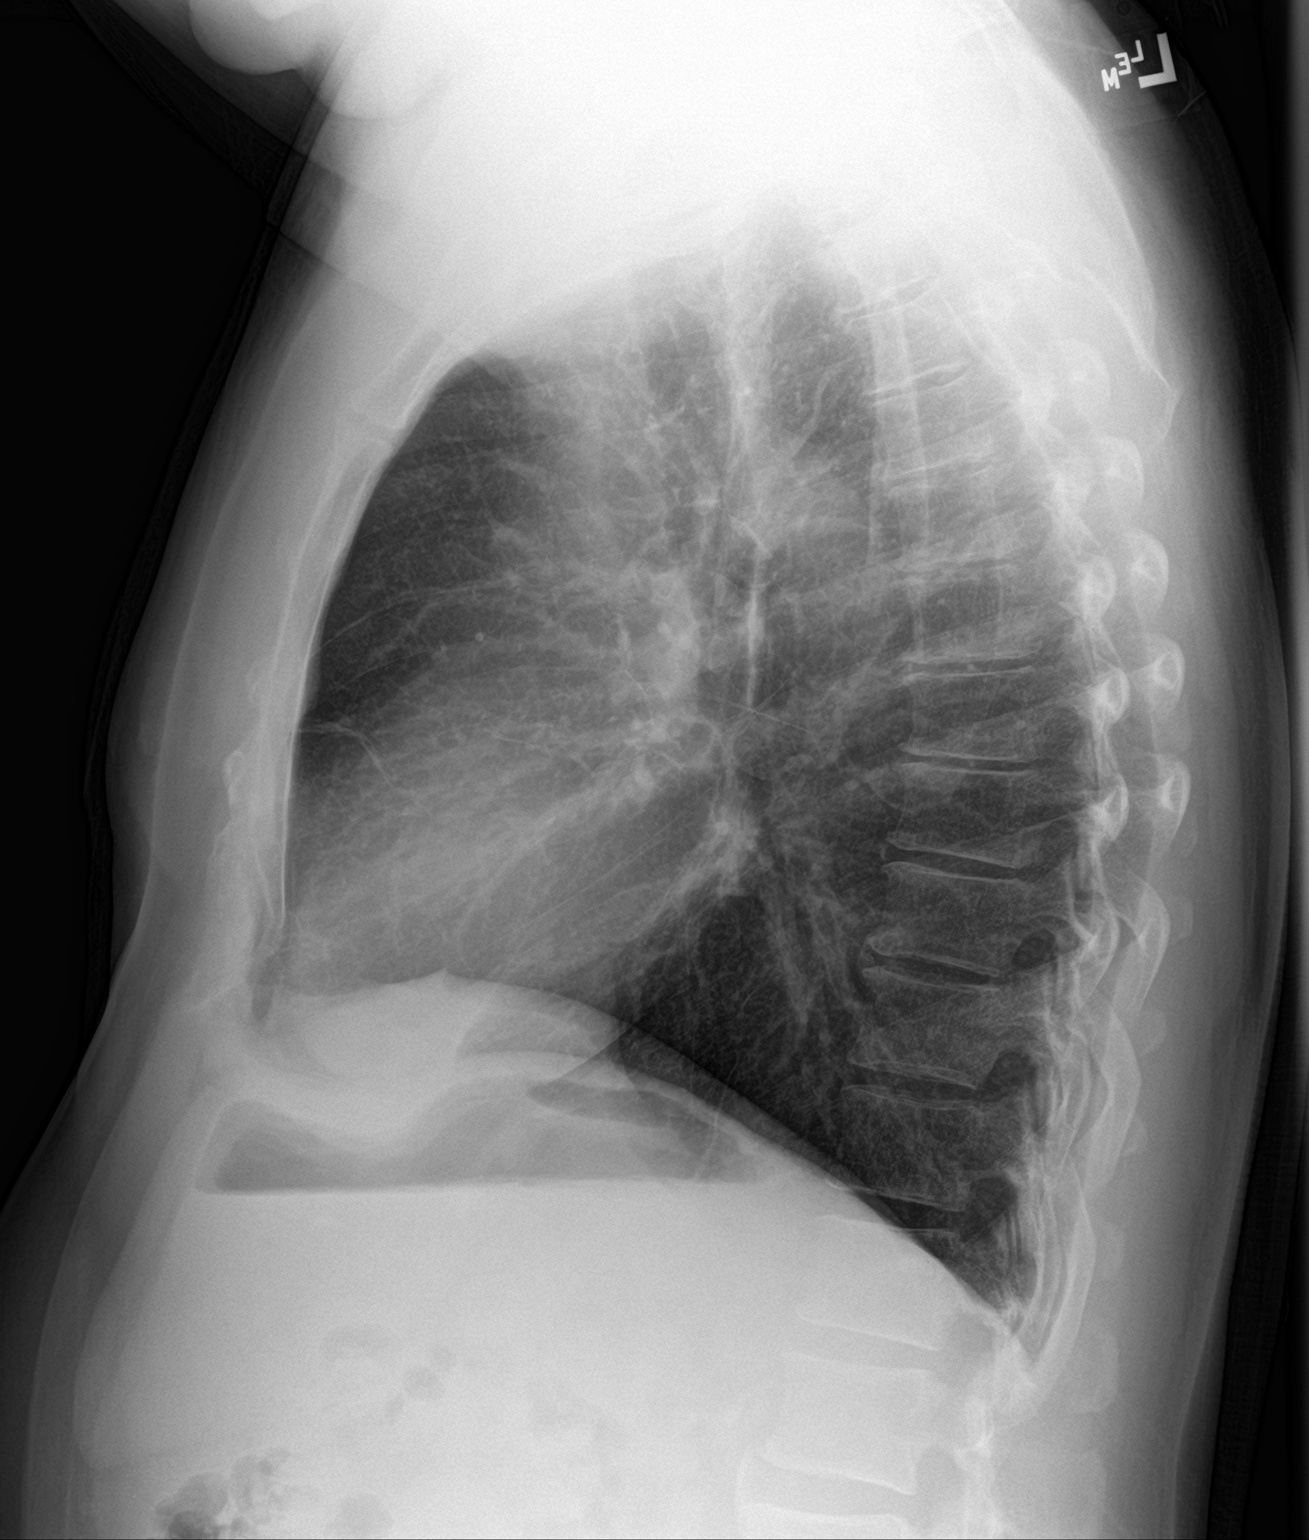

[2 of 2 positions shown; findings below may reference images not displayed]

FINDINGS: Heart size is normal. Mediastinal shadows are normal. Few small
linear pulmonary scars in the right middle lobe. Question mild
venous hypertension. No frank edema. No infiltrate, collapse or
effusion.
IMPRESSION: Question venous hypertension.  No edema.

## 2019-01-12 MED ORDER — AMLODIPINE BESYLATE 10 MG PO TABS: 10.0000 mg | ORAL_TABLET | Freq: Every day | ORAL | 1 refills | Status: DC

## 2019-01-12 MED ORDER — ROSUVASTATIN CALCIUM 5 MG PO TABS: 5.0000 mg | ORAL_TABLET | ORAL | 1 refills | Status: DC

## 2019-01-12 MED ORDER — VALSARTAN 40 MG PO TABS: 40.0000 mg | ORAL_TABLET | Freq: Every day | ORAL | 1 refills | Status: DC

## 2019-01-12 NOTE — Progress Notes (Signed)
BP (!) 182/85   Pulse 64   Temp 98.3 F (36.8 C)   SpO2 97%    Subjective:    Patient ID: Nathan Fields, male    DOB: 05/21/1951, 68 y.o.   MRN: [id number]  HPI: Nathan Fields is a 68 y.o. male  Chief Complaint  Patient presents with  . Hypertension  . Hyperlipidemia  . Depression  . IFG   Impaired Fasting Glucose HbA1C:  Lab Results  Component Value Date   HGBA1C 6.1 07/14/2018   Duration of elevated blood sugar: chronic Polydipsia: no Polyuria: no Weight change: no Visual disturbance: no Glucose Monitoring: no Diabetic Education: Not Completed Family history of diabetes: yes  HYPERTENSION / HYPERLIPIDEMIA- when he was taking the 80mg  valsartan and 10mg  amlodipine and was getting dizzy Satisfied with current treatment? no Duration of hypertension: chronic BP monitoring frequency: not checking BP medication side effects: yes Past BP meds: valsartan, amlodipine Duration of hyperlipidemia: chronic Cholesterol medication side effects: no Cholesterol supplements: none Past cholesterol medications: crestor Medication compliance: excellent compliance Aspirin: yes Recent stressors: yes Recurrent headaches: no Visual changes: no Palpitations: no Dyspnea: no Chest pain: no Lower extremity edema: no Dizzy/lightheaded: no  DEPRESSION Mood status: stable Satisfied with current treatment?: yes Symptom severity: mild  Duration of current treatment : chronic Side effects: not on anything Psychotherapy/counseling: no  Depressed mood: no Anxious mood: no Anhedonia: no Significant weight loss or gain: no Insomnia: no  Fatigue: yes Feelings of worthlessness or guilt: no Impaired concentration/indecisiveness: no Suicidal ideations: no Hopelessness: no Crying spells: no Depression screen [facility] 2/9 01/12/2019 07/14/2018 02/23/2018 08/08/2017 02/10/2017  Decreased Interest 0 0 0 0 0  Down, Depressed, Hopeless 0 0 0 0 0  PHQ - 2 Score 0 0 0 0 0  Altered sleeping 0  0 0 2 1  Tired, decreased energy 0 3 0 2 1  Change in appetite 0 0 3 3 0  Feeling bad or failure about yourself  0 0 0 0 0  Trouble concentrating 0 0 0 0 0  Moving slowly or fidgety/restless 0 0 1 1 0  Suicidal thoughts 0 0 0 0 0  PHQ-9 Score 0 3 4 8 2   Difficult doing work/chores Not difficult at all - Not difficult at all - -     Relevant past medical, surgical, family and social history reviewed and updated as indicated. Interim medical history since our last visit reviewed. Allergies and medications reviewed and updated.  Review of Systems  Constitutional: Negative.   Respiratory: Negative.   Cardiovascular: Negative.   Musculoskeletal: Positive for back pain and myalgias. Negative for arthralgias, gait problem, joint swelling, neck pain and neck stiffness.  Skin: Negative.   Neurological: Negative.   Psychiatric/Behavioral: Negative.     Per HPI unless specifically indicated above     Objective:    BP (!) 182/85   Pulse 64   Temp 98.3 F (36.8 C)   SpO2 97%   Wt Readings from Last 3 Encounters:  07/14/18 165 lb (74.8 kg)  02/23/18 172 lb 1 oz (78 kg)  11/27/17 188 lb (85.3 kg)    Physical Exam Vitals signs and nursing note reviewed.  Constitutional:      General: He is not in acute distress.    Appearance: Normal appearance. He is not ill-appearing, toxic-appearing or diaphoretic.  HENT:     Head: Normocephalic and atraumatic.     Right Ear: External ear normal.     Left Ear:  External ear normal.     Nose: Nose normal.     Mouth/Throat:     Mouth: Mucous membranes are moist.     Pharynx: Oropharynx is clear.  Eyes:     General: No scleral icterus.       Right eye: No discharge.        Left eye: No discharge.     Extraocular Movements: Extraocular movements intact.     Conjunctiva/sclera: Conjunctivae normal.     Pupils: Pupils are equal, round, and reactive to light.  Neck:     Musculoskeletal: Normal range of motion and neck supple.  Cardiovascular:      Rate and Rhythm: Normal rate and regular rhythm.     Pulses: Normal pulses.     Heart sounds: Normal heart sounds. No murmur. No friction rub. No gallop.   Pulmonary:     Effort: Pulmonary effort is normal. No respiratory distress.     Breath sounds: Normal breath sounds. No stridor. No wheezing, rhonchi or rales.  Chest:     Chest wall: No tenderness.  Musculoskeletal: Normal range of motion.  Skin:    General: Skin is warm and dry.     Capillary Refill: Capillary refill takes less than 2 seconds.     Coloration: Skin is not jaundiced or pale.     Findings: No bruising, erythema, lesion or rash.  Neurological:     General: No focal deficit present.     Mental Status: He is alert and oriented to person, place, and time. Mental status is at baseline.  Psychiatric:        Mood and Affect: Mood normal.        Behavior: Behavior normal.        Thought Content: Thought content normal.        Judgment: Judgment normal.     Results for orders placed or performed in visit on 07/14/18  Bayer DCA Hb A1c Waived  Result Value Ref Range   HB A1C (BAYER DCA - WAIVED) 6.1 <7.0 %  Comprehensive metabolic panel  Result Value Ref Range   Glucose 125 (H) 65 - 99 mg/dL   BUN 17 8 - 27 mg/dL   Creatinine, Ser 1.06 0.76 - 1.27 mg/dL   GFR calc non Af Amer 73 >59 mL/min/1.73   GFR calc Af Amer 84 >59 mL/min/1.73   BUN/Creatinine Ratio 16 10 - 24   Sodium 140 134 - 144 mmol/L   Potassium 4.3 3.5 - 5.2 mmol/L   Chloride 104 96 - 106 mmol/L   CO2 23 20 - 29 mmol/L   Calcium 9.7 8.6 - 10.2 mg/dL   Total Protein 7.1 6.0 - 8.5 g/dL   Albumin 4.6 3.6 - 4.8 g/dL   Globulin, Total 2.5 1.5 - 4.5 g/dL   Albumin/Globulin Ratio 1.8 1.2 - 2.2   Bilirubin Total 0.3 0.0 - 1.2 mg/dL   Alkaline Phosphatase 97 39 - 117 IU/L   AST 16 0 - 40 IU/L   ALT 18 0 - 44 IU/L  Lipid Panel w/o Chol/HDL Ratio  Result Value Ref Range   Cholesterol, Total 292 (H) 100 - 199 mg/dL   Triglycerides 149 0 - 149 mg/dL    HDL 43 >39 mg/dL   VLDL Cholesterol Cal 30 5 - 40 mg/dL   LDL Calculated 219 (H) 0 - 99 mg/dL      Assessment & Plan:   Problem List Items Addressed This Visit      Cardiovascular and  Mediastinum   Hypertension - Primary    Not under good control. Will restart amlodipine and recheck 1 month. Call with any concerns.       Relevant Medications   valsartan (DIOVAN) 40 MG tablet   amLODipine (NORVASC) 10 MG tablet   rosuvastatin (CRESTOR) 5 MG tablet   Other Relevant Orders   Comprehensive metabolic panel   Microalbumin, Urine Waived     Endocrine   Impaired fasting glucose    Stable with A1c of 6.1. Continue to monitor. Call with any concerns.       Relevant Orders   Bayer DCA Hb A1c Waived   Comprehensive metabolic panel     Other   Hyperlipidemia    Under good control on current regimen. Continue current regimen. Continue to monitor. Call with any concerns. Refills given. Labs drawn today.       Relevant Medications   valsartan (DIOVAN) 40 MG tablet   amLODipine (NORVASC) 10 MG tablet   rosuvastatin (CRESTOR) 5 MG tablet   Other Relevant Orders   Comprehensive metabolic panel   Lipid Panel w/o Chol/HDL Ratio   Depression    Under good control on current regimen. Continue current regimen. Continue to monitor. Call with any concerns. Refills given. Labs drawn today.       Relevant Orders   Comprehensive metabolic panel   K59 deficiency    B12 given today. Checking labs. Await results. Call with any concerns.       Relevant Orders   Comprehensive metabolic panel   D35 and Folate Panel       Follow up plan: Return in about 4 weeks (around 02/09/2019) for follow up bp.

## 2019-01-12 NOTE — Assessment & Plan Note (Signed)
Under good control on current regimen. Continue current regimen. Continue to monitor. Call with any concerns. Refills given. Labs drawn today.   

## 2019-01-12 NOTE — Assessment & Plan Note (Signed)
Stable with A1c of 6.1. Continue to monitor. Call with any concerns.

## 2019-01-12 NOTE — Assessment & Plan Note (Signed)
B12 given today. Checking labs. Await results. Call with any concerns.

## 2019-01-12 NOTE — Assessment & Plan Note (Signed)
Not under good control. Will restart amlodipine and recheck 1 month. Call with any concerns.

## 2019-01-13 LAB — COMPREHENSIVE METABOLIC PANEL
ALT: 20 IU/L (ref 0–44)
AST: 16 IU/L (ref 0–40)
Albumin/Globulin Ratio: 2.3 — ABNORMAL HIGH (ref 1.2–2.2)
Albumin: 4.5 g/dL (ref 3.8–4.8)
Alkaline Phosphatase: 95 IU/L (ref 39–117)
BUN/Creatinine Ratio: 17 (ref 10–24)
BUN: 18 mg/dL (ref 8–27)
Bilirubin Total: 0.4 mg/dL (ref 0.0–1.2)
CO2: 24 mmol/L (ref 20–29)
Calcium: 9.8 mg/dL (ref 8.6–10.2)
Chloride: 105 mmol/L (ref 96–106)
Creatinine, Ser: 1.09 mg/dL (ref 0.76–1.27)
GFR calc Af Amer: 81 mL/min/{1.73_m2} (ref >59)
GFR calc non Af Amer: 70 mL/min/{1.73_m2} (ref >59)
Globulin, Total: 2 g/dL (ref 1.5–4.5)
Glucose: 81 mg/dL (ref 65–99)
Potassium: 4.4 mmol/L (ref 3.5–5.2)
Sodium: 145 mmol/L — ABNORMAL HIGH (ref 134–144)
Total Protein: 6.5 g/dL (ref 6.0–8.5)

## 2019-01-13 LAB — LIPID PANEL W/O CHOL/HDL RATIO
Cholesterol, Total: 228 mg/dL — ABNORMAL HIGH (ref 100–199)
HDL: 43 mg/dL (ref >39)
LDL Calculated: 151 mg/dL — ABNORMAL HIGH (ref 0–99)
Triglycerides: 172 mg/dL — ABNORMAL HIGH (ref 0–149)
VLDL Cholesterol Cal: 34 mg/dL (ref 5–40)

## 2019-01-13 LAB — B12 AND FOLATE PANEL
Folate: 14.3 ng/mL (ref >3.0)
Vitamin B-12: 653 pg/mL (ref 232–1245)

## 2019-02-15 ENCOUNTER — Ambulatory Visit: Payer: Self-pay | Admitting: Family Medicine

## 2019-03-01 ENCOUNTER — Other Ambulatory Visit: Payer: Self-pay

## 2019-03-01 ENCOUNTER — Ambulatory Visit (INDEPENDENT_AMBULATORY_CARE_PROVIDER_SITE_OTHER): Payer: Medicare Other | Admitting: Family Medicine

## 2019-03-01 ENCOUNTER — Encounter: Payer: Self-pay | Admitting: Family Medicine

## 2019-03-01 VITALS — BP 126/74 | HR 75 | Temp 99.0°F | Ht 64.5 in | Wt 166.0 lb

## 2019-03-01 DIAGNOSIS — I10 Essential (primary) hypertension: Secondary | ICD-10-CM | POA: Diagnosis not present

## 2019-03-01 DIAGNOSIS — M5441 Lumbago with sciatica, right side: Secondary | ICD-10-CM | POA: Diagnosis not present

## 2019-03-01 DIAGNOSIS — Z8639 Personal history of other endocrine, nutritional and metabolic disease: Secondary | ICD-10-CM | POA: Diagnosis not present

## 2019-03-01 MED ORDER — NAPROXEN 500 MG PO TABS: 500.0000 mg | ORAL_TABLET | Freq: Two times a day (BID) | ORAL | 0 refills | Status: DC

## 2019-03-01 MED ORDER — BACLOFEN 20 MG PO TABS: 20.0000 mg | ORAL_TABLET | Freq: Three times a day (TID) | ORAL | 0 refills | Status: DC

## 2019-03-01 NOTE — Assessment & Plan Note (Signed)
Under good control on current regimen. Continue current regimen. Continue to monitor. Call with any concerns. Refills given. Labs drawn today.   

## 2019-03-01 NOTE — Progress Notes (Signed)
BP 126/74   Pulse 75   Temp 99 F (37.2 C) (Oral)   Ht 5' 4.5" (1.638 m)   Wt 166 lb (75.3 kg)   SpO2 96%   BMI 28.05 kg/m    Subjective:    Patient ID: Nathan Fields, male    DOB: 1951-02-04, 68 y.o.   MRN: [id number]  HPI: Nathan Fields is a 68 y.o. male  Chief Complaint  Patient presents with  . Hypertension  . Back Pain    right lower sided. states last wednesday pick up something heavy and started hurting   HYPERTENSION Hypertension status: controlled  Satisfied with current treatment? yes Duration of hypertension: chronic BP monitoring frequency:  not checking BP medication side effects:  no Medication compliance: excellent compliance Previous BP meds: amlodipine, valsartan Aspirin: yes Recurrent headaches: no Visual changes: no Palpitations: no Dyspnea: no Chest pain: no Lower extremity edema: no Dizzy/lightheaded: no   BACK PAIN Duration: 4-5 days Mechanism of injury: lifting Location: Right and low back Onset: sudden Severity: severe Quality: throbbing, dull, aching Frequency: constant Radiation: R leg above the knee Aggravating factors: lifting, movement, walking and laying Alleviating factors: rest Status: better Treatments attempted: rest, ice and heat  Relief with NSAIDs?: mild Nighttime pain:  yes Paresthesias / decreased sensation:  no Bowel / bladder incontinence:  no Fevers:  no Dysuria / urinary frequency:  no   Relevant past medical, surgical, family and social history reviewed and updated as indicated. Interim medical history since our last visit reviewed. Allergies and medications reviewed and updated.  Review of Systems  Constitutional: Negative.   Respiratory: Negative.   Cardiovascular: Negative.   Musculoskeletal: Positive for back pain and myalgias. Negative for arthralgias, gait problem, joint swelling, neck pain and neck stiffness.  Skin: Negative.   Neurological: Negative.   Psychiatric/Behavioral: Negative.      Per HPI unless specifically indicated above     Objective:    BP 126/74   Pulse 75   Temp 99 F (37.2 C) (Oral)   Ht 5' 4.5" (1.638 m)   Wt 166 lb (75.3 kg)   SpO2 96%   BMI 28.05 kg/m   Wt Readings from Last 3 Encounters:  03/01/19 166 lb (75.3 kg)  07/14/18 165 lb (74.8 kg)  02/23/18 172 lb 1 oz (78 kg)    Physical Exam Vitals signs and nursing note reviewed.  Constitutional:      General: He is not in acute distress.    Appearance: Normal appearance. He is not ill-appearing, toxic-appearing or diaphoretic.  HENT:     Head: Normocephalic and atraumatic.     Right Ear: External ear normal.     Left Ear: External ear normal.     Nose: Nose normal.     Mouth/Throat:     Mouth: Mucous membranes are moist.     Pharynx: Oropharynx is clear.  Eyes:     General: No scleral icterus.       Right eye: No discharge.        Left eye: No discharge.     Extraocular Movements: Extraocular movements intact.     Conjunctiva/sclera: Conjunctivae normal.     Pupils: Pupils are equal, round, and reactive to light.  Neck:     Musculoskeletal: Normal range of motion and neck supple.  Cardiovascular:     Rate and Rhythm: Normal rate and regular rhythm.     Pulses: Normal pulses.     Heart sounds: Normal heart sounds. No  murmur. No friction rub. No gallop.   Pulmonary:     Effort: Pulmonary effort is normal. No respiratory distress.     Breath sounds: Normal breath sounds. No stridor. No wheezing, rhonchi or rales.  Chest:     Chest wall: No tenderness.  Musculoskeletal: Normal range of motion.  Skin:    General: Skin is warm and dry.     Capillary Refill: Capillary refill takes less than 2 seconds.     Coloration: Skin is not jaundiced or pale.     Findings: No bruising, erythema, lesion or rash.  Neurological:     General: No focal deficit present.     Mental Status: He is alert and oriented to person, place, and time. Mental status is at baseline.  Psychiatric:         Mood and Affect: Mood normal.        Behavior: Behavior normal.        Thought Content: Thought content normal.        Judgment: Judgment normal.   Back Exam:    Inspection:  Normal spinal curvature.  No deformity, ecchymosis, erythema, or lesions     Palpation:     Midline spinal tenderness: no      Paralumbar tenderness: yes Right     Parathoracic tenderness: no      Buttocks tenderness: no     Range of Motion:      Flexion: Fingers to Knees     Extension:Decreased     Lateral bending:Decreased    Rotation:Decreased    Neuro Exam:Lower extremity DTRs normal & symmetric.  Strength and sensation intact.     Special Tests:      Straight leg raise:negative  Results for orders placed or performed in visit on 01/12/19  Bayer DCA Hb A1c Waived  Result Value Ref Range   HB A1C (BAYER DCA - WAIVED) 6.1 <7.0 %  Comprehensive metabolic panel  Result Value Ref Range   Glucose 81 65 - 99 mg/dL   BUN 18 8 - 27 mg/dL   Creatinine, Ser 1.09 0.76 - 1.27 mg/dL   GFR calc non Af Amer 70 >59 mL/min/1.73   GFR calc Af Amer 81 >59 mL/min/1.73   BUN/Creatinine Ratio 17 10 - 24   Sodium 145 (H) 134 - 144 mmol/L   Potassium 4.4 3.5 - 5.2 mmol/L   Chloride 105 96 - 106 mmol/L   CO2 24 20 - 29 mmol/L   Calcium 9.8 8.6 - 10.2 mg/dL   Total Protein 6.5 6.0 - 8.5 g/dL   Albumin 4.5 3.8 - 4.8 g/dL   Globulin, Total 2.0 1.5 - 4.5 g/dL   Albumin/Globulin Ratio 2.3 (H) 1.2 - 2.2   Bilirubin Total 0.4 0.0 - 1.2 mg/dL   Alkaline Phosphatase 95 39 - 117 IU/L   AST 16 0 - 40 IU/L   ALT 20 0 - 44 IU/L  Lipid Panel w/o Chol/HDL Ratio  Result Value Ref Range   Cholesterol, Total 228 (H) 100 - 199 mg/dL   Triglycerides 172 (H) 0 - 149 mg/dL   HDL 43 >39 mg/dL   VLDL Cholesterol Cal 34 5 - 40 mg/dL   LDL Calculated 151 (H) 0 - 99 mg/dL  Microalbumin, Urine Waived  Result Value Ref Range   Microalb, Ur Waived 30 (H) 0 - 19 mg/L   Creatinine, Urine Waived 50 10 - 300 mg/dL   Microalb/Creat Ratio 30-300  (H) <30 mg/g  B12 and Folate Panel  Result  Value Ref Range   Vitamin B-12 653 232 - 1,245 pg/mL   Folate 14.3 >3.0 ng/mL      Assessment & Plan:   Problem List Items Addressed This Visit      Cardiovascular and Mediastinum   Hypertension - Primary    Under good control on current regimen. Continue current regimen. Continue to monitor. Call with any concerns. Refills given. Labs drawn today.       Relevant Orders   Basic metabolic panel    Other Visit Diagnoses    Acute right-sided low back pain with right-sided sciatica       Will treat with naproxen, baclofen and exercises. Call if not getting better or getting worse. Call with any concerns.    Relevant Medications   naproxen (NAPROSYN) 500 MG tablet   baclofen (LIORESAL) 20 MG tablet       Follow up plan: Return in about 6 months (around 08/29/2019).

## 2019-03-01 NOTE — Patient Instructions (Signed)

## 2019-03-02 LAB — BASIC METABOLIC PANEL
BUN/Creatinine Ratio: 15 (ref 10–24)
BUN: 16 mg/dL (ref 8–27)
CO2: 25 mmol/L (ref 20–29)
Calcium: 9.6 mg/dL (ref 8.6–10.2)
Chloride: 103 mmol/L (ref 96–106)
Creatinine, Ser: 1.06 mg/dL (ref 0.76–1.27)
GFR calc Af Amer: 84 mL/min/{1.73_m2} (ref >59)
GFR calc non Af Amer: 72 mL/min/{1.73_m2} (ref >59)
Glucose: 92 mg/dL (ref 65–99)
Potassium: 4.6 mmol/L (ref 3.5–5.2)
Sodium: 140 mmol/L (ref 134–144)

## 2019-03-04 ENCOUNTER — Encounter: Payer: Self-pay | Admitting: Family Medicine

## 2019-06-28 ENCOUNTER — Other Ambulatory Visit: Payer: Self-pay | Admitting: Family Medicine

## 2019-08-02 ENCOUNTER — Ambulatory Visit: Payer: Medicare HMO | Admitting: Family Medicine

## 2019-08-02 ENCOUNTER — Ambulatory Visit (INDEPENDENT_AMBULATORY_CARE_PROVIDER_SITE_OTHER): Payer: Medicare HMO | Admitting: Family Medicine

## 2019-08-02 ENCOUNTER — Encounter: Payer: Self-pay | Admitting: Family Medicine

## 2019-08-02 DIAGNOSIS — F332 Major depressive disorder, recurrent severe without psychotic features: Secondary | ICD-10-CM

## 2019-08-02 DIAGNOSIS — Z125 Encounter for screening for malignant neoplasm of prostate: Secondary | ICD-10-CM

## 2019-08-02 DIAGNOSIS — I10 Essential (primary) hypertension: Secondary | ICD-10-CM | POA: Diagnosis not present

## 2019-08-02 DIAGNOSIS — R7301 Impaired fasting glucose: Secondary | ICD-10-CM

## 2019-08-02 DIAGNOSIS — E538 Deficiency of other specified B group vitamins: Secondary | ICD-10-CM

## 2019-08-02 DIAGNOSIS — E782 Mixed hyperlipidemia: Secondary | ICD-10-CM | POA: Diagnosis not present

## 2019-08-02 MED ORDER — NAPROXEN 500 MG PO TABS: 500.0000 mg | ORAL_TABLET | Freq: Two times a day (BID) | ORAL | 1 refills | Status: DC

## 2019-08-02 MED ORDER — BACLOFEN 20 MG PO TABS: 20.0000 mg | ORAL_TABLET | Freq: Three times a day (TID) | ORAL | 0 refills | Status: DC

## 2019-08-02 MED ORDER — AMLODIPINE BESYLATE 10 MG PO TABS: 10.0000 mg | ORAL_TABLET | Freq: Every day | ORAL | 1 refills | Status: DC

## 2019-08-02 MED ORDER — VALSARTAN 40 MG PO TABS: 40.0000 mg | ORAL_TABLET | Freq: Every day | ORAL | 1 refills | Status: DC

## 2019-08-02 MED ORDER — ROSUVASTATIN CALCIUM 5 MG PO TABS: 5.0000 mg | ORAL_TABLET | ORAL | 1 refills | Status: DC

## 2019-08-02 NOTE — Assessment & Plan Note (Signed)
States that he is feeling well. Will give refills and get him in for BP and labs ASAP. Call with any concerns. Continue to monitor.

## 2019-08-02 NOTE — Assessment & Plan Note (Signed)
Had a bad fight with his wife. Not feeling well, but hanging in there. Continue to monitor. Call with any concerns.

## 2019-08-02 NOTE — Assessment & Plan Note (Signed)
Needs to come in for BP. States that he is feeling well. Will give refills and get him in for BP and labs ASAP. Call with any concerns. Continue to monitor.

## 2019-08-02 NOTE — Progress Notes (Signed)
There were no vitals taken for this visit.   Subjective:    Patient ID: Nathan Fields, male    DOB: 15-Sep-1950, 69 y.o.   MRN: [id number]  HPI: Nathan Fields is a 69 y.o. male  Chief Complaint  Patient presents with  . Hypertension   HYPERTENSION / [place] Satisfied with current treatment? yes Duration of hypertension: chronic BP monitoring frequency: not checking BP medication side effects: no Past BP meds: valsartan, amlodipine Duration of hyperlipidemia: chronic Cholesterol medication side effects: no Cholesterol supplements: none Past cholesterol medications: crestor Medication compliance: excellent compliance Aspirin: yes Recent stressors: yes Recurrent headaches: no Visual changes: no Palpitations: no Dyspnea: no Chest pain: no Lower extremity edema: no Dizzy/lightheaded: no  Impaired Fasting Glucose HbA1C:  Lab Results  Component Value Date   HGBA1C 6.1 01/12/2019   Duration of elevated blood sugar: chronic Polydipsia: no Polyuria: no Weight change: no Visual disturbance: no Glucose Monitoring: no Diabetic Education: Not Completed Family history of diabetes: yes  DEPRESSION Mood status: better Satisfied with current treatment?: yes Symptom severity: mild  Duration of current treatment : not on anything Depressed mood: no Anxious mood: no Anhedonia: no Significant weight loss or gain: no Insomnia: no  Fatigue: no Feelings of worthlessness or guilt: no Impaired concentration/indecisiveness: no Suicidal ideations: no Hopelessness: no Crying spells: no Depression screen [facility] 2/9 08/02/2019 01/12/2019 07/14/2018 02/23/2018 08/08/2017  Decreased Interest 0 0 0 0 0  Down, Depressed, Hopeless 0 0 0 0 0  PHQ - 2 Score 0 0 0 0 0  Altered sleeping 0 0 0 0 2  Tired, decreased energy 1 0 3 0 2  Change in appetite 1 0 0 3 3  Feeling bad or failure about yourself  0 0 0 0 0  Trouble concentrating 0 0 0 0 0  Moving slowly or fidgety/restless 0 0  0 1 1  Suicidal thoughts 0 0 0 0 0  PHQ-9 Score 2 0 3 4 8   Difficult doing work/chores Not difficult at all Not difficult at all - Not difficult at all -  Some recent data might be hidden    Relevant past medical, surgical, family and social history reviewed and updated as indicated. Interim medical history since our last visit reviewed. Allergies and medications reviewed and updated.  Review of Systems  Constitutional: Negative.   Respiratory: Negative.   Cardiovascular: Negative.   Gastrointestinal: Negative.   Musculoskeletal: Negative.   Neurological: Negative.   Psychiatric/Behavioral: Negative for agitation, behavioral problems, confusion, decreased concentration, dysphoric mood, hallucinations, self-injury, sleep disturbance and suicidal ideas. The patient is nervous/anxious. The patient is not hyperactive.     Per HPI unless specifically indicated above     Objective:    There were no vitals taken for this visit.  Wt Readings from Last 3 Encounters:  03/01/19 166 lb (75.3 kg)  07/14/18 165 lb (74.8 kg)  02/23/18 172 lb 1 oz (78 kg)    Physical Exam Vitals and nursing note reviewed.  Pulmonary:     Effort: Pulmonary effort is normal. No respiratory distress.     Comments: Speaking in full sentences Neurological:     Mental Status: He is alert.  Psychiatric:        Mood and Affect: Mood normal.        Behavior: Behavior normal.        Thought Content: Thought content normal.        Judgment: Judgment normal.     Results for orders  placed or performed in visit on 99991111  Basic metabolic panel  Result Value Ref Range   Glucose 92 65 - 99 mg/dL   BUN 16 8 - 27 mg/dL   Creatinine, Ser 1.06 0.76 - 1.27 mg/dL   GFR calc non Af Amer 72 >59 mL/min/1.73   GFR calc Af Amer 84 >59 mL/min/1.73   BUN/Creatinine Ratio 15 10 - 24   Sodium 140 134 - 144 mmol/L   Potassium 4.6 3.5 - 5.2 mmol/L   Chloride 103 96 - 106 mmol/L   CO2 25 20 - 29 mmol/L   Calcium 9.6 8.6 -  10.2 mg/dL      Assessment & Plan:   Problem List Items Addressed This Visit      Cardiovascular and Mediastinum   Hypertension - Primary    Needs to come in for BP. States that he is feeling well. Will give refills and get him in for BP and labs ASAP. Call with any concerns. Continue to monitor.       Relevant Medications   amLODipine (NORVASC) 10 MG tablet   rosuvastatin (CRESTOR) 5 MG tablet   valsartan (DIOVAN) 40 MG tablet   Other Relevant Orders   CBC with Differential/Platelet   Comprehensive metabolic panel   Microalbumin, Urine Waived   TSH     Endocrine   Impaired fasting glucose    States that he is feeling well. Will give refills and get him in for BP and labs ASAP. Call with any concerns. Continue to monitor.       Relevant Orders   Bayer DCA Hb A1c Waived   CBC with Differential/Platelet   Comprehensive metabolic panel   UA/M w/rflx Culture, Routine     Other   Hyperlipidemia    States that he is feeling well. Will give refills and get him in for BP and labs ASAP. Call with any concerns. Continue to monitor.       Relevant Medications   amLODipine (NORVASC) 10 MG tablet   rosuvastatin (CRESTOR) 5 MG tablet   valsartan (DIOVAN) 40 MG tablet   Other Relevant Orders   CBC with Differential/Platelet   Comprehensive metabolic panel   Lipid Panel w/o Chol/HDL Ratio   Depression    Had a bad fight with his wife. Not feeling well, but hanging in there. Continue to monitor. Call with any concerns.       Relevant Orders   CBC with Differential/Platelet   Comprehensive metabolic panel   TSH   [id number] deficiency    States that he is feeling well. Will give refills and get him in for BP and labs ASAP. Call with any concerns. Continue to monitor.       Relevant Orders   CBC with Differential/Platelet   Comprehensive metabolic panel    Other Visit Diagnoses    Screening for prostate cancer       Labs to be drawn ASAP. Await results.    Relevant Orders    PSA       Follow up plan: Return in about 6 months (around 01/30/2020).   . This visit was completed via telephone due to the restrictions of the COVID-19 pandemic. All issues as above were discussed and addressed but no physical exam was performed. If it was felt that the patient should be evaluated in the office, they were directed there. The patient verbally consented to this visit. Patient was unable to complete an audio/visual visit due to Lack of equipment. Due to the  catastrophic nature of the COVID-19 pandemic, this visit was done through audio contact only. . Location of the patient: home . Location of the provider: work . Those involved with this call:  . Provider: Park Liter, DO . CMA: Tiffany Reel, CMA . Front Desk/Registration: Don Perking  . Time spent on call: 25 minutes on the phone discussing health concerns. 40 minutes total spent in review of patient's record and preparation of their chart.

## 2019-08-09 ENCOUNTER — Telehealth: Payer: Self-pay

## 2019-08-09 NOTE — Telephone Encounter (Signed)
-----   Message from Valerie Roys, [place] sent at 08/02/2019  1:37 PM EST ----- Call to see if he can come in for his blood work/blood pressure

## 2019-08-09 NOTE — Telephone Encounter (Signed)
Called and spoke to patient. Scheduled for labs and BP for 08/10/19

## 2019-08-10 ENCOUNTER — Other Ambulatory Visit: Payer: Self-pay

## 2019-08-10 ENCOUNTER — Other Ambulatory Visit: Payer: Medicare HMO

## 2019-08-10 DIAGNOSIS — I10 Essential (primary) hypertension: Secondary | ICD-10-CM | POA: Diagnosis not present

## 2019-08-10 DIAGNOSIS — E782 Mixed hyperlipidemia: Secondary | ICD-10-CM

## 2019-08-10 DIAGNOSIS — F332 Major depressive disorder, recurrent severe without psychotic features: Secondary | ICD-10-CM

## 2019-08-10 DIAGNOSIS — R7301 Impaired fasting glucose: Secondary | ICD-10-CM | POA: Diagnosis not present

## 2019-08-10 DIAGNOSIS — E538 Deficiency of other specified B group vitamins: Secondary | ICD-10-CM

## 2019-08-10 DIAGNOSIS — Z125 Encounter for screening for malignant neoplasm of prostate: Secondary | ICD-10-CM | POA: Diagnosis not present

## 2019-08-10 LAB — UA/M W/RFLX CULTURE, ROUTINE
Bilirubin, UA: NEGATIVE
Glucose, UA: NEGATIVE
Ketones, UA: NEGATIVE
Leukocytes,UA: NEGATIVE
Nitrite, UA: NEGATIVE
Protein,UA: NEGATIVE
RBC, UA: NEGATIVE
Specific Gravity, UA: 1.025 (ref 1.005–1.030)
Urobilinogen, Ur: 0.2 mg/dL (ref 0.2–1.0)
pH, UA: 5.5 (ref 5.0–7.5)

## 2019-08-10 LAB — MICROALBUMIN, URINE WAIVED
Creatinine, Urine Waived: 200 mg/dL (ref 10–300)
Microalb, Ur Waived: 30 mg/L — ABNORMAL HIGH (ref 0–19)
Microalb/Creat Ratio: <30 mg/g (ref <30)

## 2019-08-10 LAB — BAYER DCA HB A1C WAIVED: HB A1C (BAYER DCA - WAIVED): 5.7 % (ref <7.0)

## 2019-08-11 ENCOUNTER — Telehealth: Payer: Self-pay | Admitting: Family Medicine

## 2019-08-11 LAB — COMPREHENSIVE METABOLIC PANEL
ALT: 21 IU/L (ref 0–44)
AST: 20 IU/L (ref 0–40)
Albumin/Globulin Ratio: 2 (ref 1.2–2.2)
Albumin: 4.5 g/dL (ref 3.8–4.8)
Alkaline Phosphatase: 103 IU/L (ref 39–117)
BUN/Creatinine Ratio: 14 (ref 10–24)
BUN: 15 mg/dL (ref 8–27)
Bilirubin Total: 0.3 mg/dL (ref 0.0–1.2)
CO2: 23 mmol/L (ref 20–29)
Calcium: 10 mg/dL (ref 8.6–10.2)
Chloride: 103 mmol/L (ref 96–106)
Creatinine, Ser: 1.07 mg/dL (ref 0.76–1.27)
GFR calc Af Amer: 83 mL/min/{1.73_m2} (ref >59)
GFR calc non Af Amer: 71 mL/min/{1.73_m2} (ref >59)
Globulin, Total: 2.3 g/dL (ref 1.5–4.5)
Glucose: 97 mg/dL (ref 65–99)
Potassium: 4.3 mmol/L (ref 3.5–5.2)
Sodium: 140 mmol/L (ref 134–144)
Total Protein: 6.8 g/dL (ref 6.0–8.5)

## 2019-08-11 LAB — CBC WITH DIFFERENTIAL/PLATELET
Basophils Absolute: 0.1 10*3/uL (ref 0.0–0.2)
Basos: 1 %
EOS (ABSOLUTE): 0.1 10*3/uL (ref 0.0–0.4)
Eos: 2 %
Hematocrit: 43.9 % (ref 37.5–51.0)
Hemoglobin: 14.9 g/dL (ref 13.0–17.7)
Immature Grans (Abs): 0 10*3/uL (ref 0.0–0.1)
Immature Granulocytes: 0 %
Lymphocytes Absolute: 1.8 10*3/uL (ref 0.7–3.1)
Lymphs: 29 %
MCH: 30.2 pg (ref 26.6–33.0)
MCHC: 33.9 g/dL (ref 31.5–35.7)
MCV: 89 fL (ref 79–97)
Monocytes Absolute: 0.6 10*3/uL (ref 0.1–0.9)
Monocytes: 10 %
Neutrophils Absolute: 3.5 10*3/uL (ref 1.4–7.0)
Neutrophils: 58 %
Platelets: 238 10*3/uL (ref 150–450)
RBC: 4.94 x10E6/uL (ref 4.14–5.80)
RDW: 13 % (ref 11.6–15.4)
WBC: 6.2 10*3/uL (ref 3.4–10.8)

## 2019-08-11 LAB — LIPID PANEL W/O CHOL/HDL RATIO
Cholesterol, Total: 199 mg/dL (ref 100–199)
HDL: 51 mg/dL (ref >39)
LDL Chol Calc (NIH): 117 mg/dL — ABNORMAL HIGH (ref 0–99)
Triglycerides: 174 mg/dL — ABNORMAL HIGH (ref 0–149)
VLDL Cholesterol Cal: 31 mg/dL (ref 5–40)

## 2019-08-11 LAB — TSH: TSH: 1.84 u[IU]/mL (ref 0.450–4.500)

## 2019-08-11 LAB — PSA: Prostate Specific Ag, Serum: 1.8 ng/mL (ref 0.0–4.0)

## 2019-08-11 NOTE — Telephone Encounter (Signed)
Called and LVM letting patient know of normal results.

## 2019-08-11 NOTE — Telephone Encounter (Signed)
Please let Nathan Fields know that his labs all look nice and normal. Thanks!

## 2019-11-10 ENCOUNTER — Ambulatory Visit: Payer: Medicare HMO

## 2019-11-14 ENCOUNTER — Encounter: Payer: Self-pay | Admitting: Family Medicine

## 2019-11-22 ENCOUNTER — Ambulatory Visit: Payer: Medicare HMO

## 2019-11-24 ENCOUNTER — Ambulatory Visit (INDEPENDENT_AMBULATORY_CARE_PROVIDER_SITE_OTHER): Payer: Medicare HMO

## 2019-11-24 VITALS — Ht 66.0 in | Wt 170.0 lb

## 2019-11-24 DIAGNOSIS — Z0000 Encounter for general adult medical examination without abnormal findings: Secondary | ICD-10-CM

## 2019-11-24 NOTE — Progress Notes (Signed)
Subjective:   Nathan Fields is a 69 y.o. male who presents for Medicare Annual/Subsequent preventive examination.  I connected with Nathan Fields today by telephone and verified that I am speaking with the correct person using two identifiers. Location patient: home Location provider: work Persons participating in the virtual visit: patient, provider.   I discussed the limitations, risks, security and privacy concerns of performing an evaluation and management service by telephone and the availability of in person appointments. I also discussed with the patient that there may be a patient responsible charge related to this service. The patient expressed understanding and verbally consented to this telephonic visit.    Interactive audio and video telecommunications were attempted between this provider and patient, however failed, due to patient having technical difficulties OR patient did not have access to video capability.  We continued and completed visit with audio only.  Some vital signs may be absent or patient reported.   Time Spent with patient on telephone encounter: 15 minutes  Review of Systems:   Cardiac Risk Factors include: advanced age (>26men, >78 women);dyslipidemia;hypertension;male gender     Objective:    Vitals: Ht 5\' 6"  (1.676 m)   Wt 170 lb (77.1 kg)   BMI 27.44 kg/m   Body mass index is 27.44 kg/m.  Advanced Directives 11/24/2019 08/22/2017 01/14/2017 10/08/2016 02/15/2015  Does Patient Have a Medical Advance Directive? Yes Yes Yes Yes Yes  Type of Advance Directive Living will;Healthcare Power of [place];Living will [place];Living will [place];Living will -  Does patient want to make changes to medical advance directive? - - - No - Patient declined -  Copy of [place] in Chart? No - copy requested - No - copy requested No - copy requested -    Tobacco Social  History   Tobacco Use  Smoking Status Never Smoker  Smokeless Tobacco Never Used     Counseling given: Not Answered   Clinical Intake:  Pre-visit preparation completed: Yes  Pain : No/denies pain     Nutritional Status: BMI > 30  Obese Nutritional Risks: None Diabetes: No           Past Medical History:  Diagnosis Date  . Anxiety   . Arthritis    right foot  . CAD (coronary artery disease)   . Chronic low back pain   . Diabetes mellitus without complication ([place])   . Diabetes mellitus without complication ([place])   . GERD (gastroesophageal reflux disease)   . Hyperlipidemia   . Hypertension   . Impaired fasting glucose   . Neuromuscular disorder ([place])   . Plantar fasciitis   . Syncope    Past Surgical History:  Procedure Laterality Date  . CARDIAC CATHETERIZATION    . COLONOSCOPY  2011   Polyp removed, follow up in 5 years  . COLONOSCOPY WITH PROPOFOL N/A 01/14/2017   Procedure: COLONOSCOPY WITH PROPOFOL;  Surgeon: [name], MD;  Location: [facility] ENDOSCOPY;  Service: Endoscopy;  Laterality: N/A;   Family History  Problem Relation Age of Onset  . Heart disease Mother   . Hyperlipidemia Mother   . Diabetes Mother   . Congestive Heart Failure Mother   . Hypertension Mother   . Diabetes Father   . Heart attack Father   . Hyperlipidemia Father    Social History   Socioeconomic History  . Marital status: Married    Spouse name: Not on file  . Number  of children: 2  . Years of education: 9th grade  . Highest education level: Not on file  Occupational History  . Not on file  Tobacco Use  . Smoking status: Never Smoker  . Smokeless tobacco: Never Used  Substance and Sexual Activity  . Alcohol use: Yes    Alcohol/week: 0.0 standard drinks    Comment: on rare occasion  . Drug use: No  . Sexual activity: Not Currently  Other Topics Concern  . Not on file  Social History Narrative  . Not on file   Social Determinants of Health   Financial  Resource Strain:   . Difficulty of Paying Living Expenses:   Food Insecurity:   . Worried About Charity fundraiser in the Last Year:   . Arboriculturist in the Last Year:   Transportation Needs:   . Film/video editor (Medical):   Marland Kitchen Lack of Transportation (Non-Medical):   Physical Activity:   . Days of Exercise per Week:   . Minutes of Exercise per Session:   Stress:   . Feeling of Stress :   Social Connections:   . Frequency of Communication with Friends and Family:   . Frequency of Social Gatherings with Friends and Family:   . Attends Religious Services:   . Active Member of Clubs or Organizations:   . Attends Archivist Meetings:   Marland Kitchen Marital Status:     Outpatient Encounter Medications as of 11/24/2019  Medication Sig  . amLODipine (NORVASC) 10 MG tablet Take 1 tablet (10 mg total) by mouth daily.  Marland Kitchen aspirin 81 MG tablet Take 81 mg by mouth daily.  . rosuvastatin (CRESTOR) 5 MG tablet Take 1 tablet (5 mg total) by mouth every other day.  . valsartan (DIOVAN) 40 MG tablet Take 1 tablet (40 mg total) by mouth daily.  . [DISCONTINUED] baclofen (LIORESAL) 20 MG tablet Take 1 tablet (20 mg total) by mouth 3 (three) times daily. Do not drive on this medicine, it will make you sleepy  . [DISCONTINUED] naproxen (NAPROSYN) 500 MG tablet Take 1 tablet (500 mg total) by mouth 2 (two) times daily with a meal.   No facility-administered encounter medications on file as of 11/24/2019.    Activities of Daily Living In your present state of health, do you have any difficulty performing the following activities: 11/24/2019 08/02/2019  Hearing? N N  Comment no hearing aids -  Vision? N N  Comment eyeglassses, [facility] vision -  Difficulty concentrating or making decisions? N N  Walking or climbing stairs? N N  Dressing or bathing? N N  Doing errands, shopping? N N  Preparing Food and eating ? N -  Using the Toilet? N -  In the past six months, have you accidently leaked  urine? N -  Do you have problems with loss of bowel control? N -  Managing your Medications? N -  Managing your Finances? N -  Housekeeping or managing your Housekeeping? N -  Some recent data might be hidden    Patient Care Team: Valerie Roys, DO as PCP - General (Family Medicine) Dionisio David, MD as Consulting Physician (Cardiology)   Assessment:   This is a routine wellness examination for Gilles.  Exercise Activities and Dietary recommendations Current Exercise Habits: The patient has a physically strenuous job, but has no regular exercise apart from work., Exercise limited by: None identified  Goals Addressed   None     Fall Risk: Fall  Risk  11/24/2019 08/02/2019 07/14/2018 02/23/2018 09/15/2017  Falls in the past year? 0 0 0 No No  Number falls in past yr: 0 0 - - -  Injury with Fall? 0 0 - - -  Follow up - - Falls evaluation completed - -    FALL RISK PREVENTION PERTAINING TO THE HOME:  Any stairs in or around the home? No  If so, are there any without handrails? No   Home free of loose throw rugs in walkways, pet beds, electrical cords, etc? Yes  Adequate lighting in your home to reduce risk of falls? Yes   ASSISTIVE DEVICES UTILIZED TO PREVENT FALLS:  Life alert? No  Use of a cane, walker or w/c? No  Grab bars in the bathroom? No  Shower chair or bench in shower? No  Elevated toilet seat or a handicapped toilet? No   TIMED UP AND GO:  Unable to perform   Depression Screen PHQ 2/9 Scores 11/24/2019 08/02/2019 01/12/2019 07/14/2018  PHQ - 2 Score 0 0 0 0  PHQ- 9 Score - 2 0 3    Cognitive Function     6CIT Screen 02/23/2018 10/08/2016  What Year? 0 points 0 points  What month? 3 points 3 points  What time? 3 points 0 points  Count back from 20 0 points 0 points  Months in reverse 0 points 0 points  Repeat phrase 4 points 4 points  Total Score 10 7    Immunization History  Administered Date(s) Administered  . Influenza, High Dose Seasonal PF  05/22/2018    Qualifies for Shingles Vaccine? Yes  Zostavax completed n/a. Due for Shingrix. Education has been provided regarding the importance of this vaccine. Pt has been advised to call insurance company to determine out of pocket expense. Advised may also receive vaccine at local pharmacy or Health Dept. Verbalized acceptance and understanding.  Tdap: Discussed need for TD/TDAP vaccine, patient verbalized understanding that this is not covered as a preventative with there insurance and to call the office if he develops any new skin injuries, ie: cuts, scrapes, bug bites, or open wounds.  Flu Vaccine: Due 03/2020  Pneumococcal Vaccine: declined   Covid-19 Vaccine: declined   Screening Tests Health Maintenance  Topic Date Due  . COVID-19 Vaccine (1) 12/10/2019 (Originally 10/31/1962)  . TETANUS/TDAP  02/29/2020 (Originally [birth year]1970)  . PNA vac Low Risk Adult (1 of 2 - PCV13) 02/29/2020 (Originally 10/30/2016)  . INFLUENZA VACCINE  01/30/2020  . COLONOSCOPY  01/14/2022  . Hepatitis C Screening  Completed   Cancer Screenings:  Colorectal Screening: Completed 2018, due 2023  Lung Cancer Screening: (Low Dose CT Chest recommended if Age 8-80 years, 30 pack-year currently smoking OR have quit w/in 15years.) does not qualify.     Additional Screening:  Hepatitis C Screening: does qualify; Completed 2016  Vision Screening: Recommended annual ophthalmology exams for early detection of glaucoma and other disorders of the eye. Is the patient up to date with their annual eye exam?  Yes  Who is the provider or what is the name of the office in which the pt attends annual eye exams? [facility]   Dental Screening: Recommended annual dental exams for proper oral hygiene  Community Resource Referral:  CRR required this visit?  No        Plan:  I have personally reviewed and addressed the Medicare Annual Wellness questionnaire and have noted the following in the patient's chart:   A. Medical and social history B. Use of  alcohol, tobacco or illicit drugs  C. Current medications and supplements D. Functional ability and status E.  Nutritional status F.  Physical activity G. Advance directives H. List of other physicians I.  Hospitalizations, surgeries, and ER visits in previous 12 months J.  [place] such as hearing and vision if needed, cognitive and depression L. Referrals and appointments   In addition, I have reviewed and discussed with patient certain preventive protocols, quality metrics, and best practice recommendations. A written personalized care plan for preventive services as well as general preventive health recommendations were provided to patient.   Signed,   Bevelyn Ngo, LPN  [id number] Nurse Health Advisor   Nurse Notes:

## 2019-11-24 NOTE — Patient Instructions (Signed)
Mr. Nathan Fields , Thank you for taking time to come for your Medicare Wellness Visit. I appreciate your ongoing commitment to your health goals. Please review the following plan we discussed and let me know if I can assist you in the future.   Screening recommendations/referrals: Colonoscopy: completed 2018, due 2023 Recommended yearly ophthalmology/optometry visit for glaucoma screening and checkup Recommended yearly dental visit for hygiene and checkup  Vaccinations: Influenza vaccine: declined  Pneumococcal vaccine: declined  Tdap vaccine: declined  Shingles vaccine: shingrix eligible   Covid-19: declined   Advanced directives: Please bring a copy of your health care power of attorney and living will to the office at your convenience.  Conditions/risks identified: none   Next appointment: Follow up in one year for your annual wellness visit   Preventive Care 68 Years and Older, Male Preventive care refers to lifestyle choices and visits with your health care provider that can promote health and wellness. What does preventive care include?  A yearly physical exam. This is also called an annual well check.  Dental exams once or twice a year.  Routine eye exams. Ask your health care provider how often you should have your eyes checked.  Personal lifestyle choices, including:  Daily care of your teeth and gums.  Regular physical activity.  Eating a healthy diet.  Avoiding tobacco and drug use.  Limiting alcohol use.  Practicing safe sex.  Taking low doses of aspirin every day.  Taking vitamin and mineral supplements as recommended by your health care provider. What happens during an annual well check? The services and screenings done by your health care provider during your annual well check will depend on your age, overall health, lifestyle risk factors, and family history of disease. Counseling  Your health care provider may ask you questions about your:  Alcohol  use.  Tobacco use.  Drug use.  Emotional well-being.  Home and relationship well-being.  Sexual activity.  Eating habits.  History of falls.  Memory and ability to understand (cognition).  Work and work Statistician. Screening  You may have the following tests or measurements:  Height, weight, and BMI.  Blood pressure.  Lipid and cholesterol levels. These may be checked every 5 years, or more frequently if you are over 68 years old.  Skin check.  Lung cancer screening. You may have this screening every year starting at age 29 if you have a 30-pack-year history of smoking and currently smoke or have quit within the past 15 years.  Fecal occult blood test (FOBT) of the stool. You may have this test every year starting at age 68.  Flexible sigmoidoscopy or colonoscopy. You may have a sigmoidoscopy every 5 years or a colonoscopy every 10 years starting at age 68.  Prostate cancer screening. Recommendations will vary depending on your family history and other risks.  Hepatitis C blood test.  Hepatitis B blood test.  Sexually transmitted disease (STD) testing.  Diabetes screening. This is done by checking your blood sugar (glucose) after you have not eaten for a while (fasting). You may have this done every 1-3 years.  Abdominal aortic aneurysm (AAA) screening. You may need this if you are a current or former smoker.  Osteoporosis. You may be screened starting at age 83 if you are at high risk. Talk with your health care provider about your test results, treatment options, and if necessary, the need for more tests. Vaccines  Your health care provider may recommend certain vaccines, such as:  Influenza vaccine. This  is recommended every year.  Tetanus, diphtheria, and acellular pertussis (Tdap, Td) vaccine. You may need a Td booster every 10 years.  Zoster vaccine. You may need this after age 68.  Pneumococcal 13-valent conjugate (PCV13) vaccine. One dose is  recommended after age 68.  Pneumococcal polysaccharide (PPSV23) vaccine. One dose is recommended after age 68. Talk to your health care provider about which screenings and vaccines you need and how often you need them. This information is not intended to replace advice given to you by your health care provider. Make sure you discuss any questions you have with your health care provider. Document Released: 07/14/2015 Document Revised: 03/06/2016 Document Reviewed: 04/18/2015 Elsevier Interactive Patient Education  2017 [place] Prevention in the Home Falls can cause injuries. They can happen to people of all ages. There are many things you can do to make your home safe and to help prevent falls. What can I do on the outside of my home?  Regularly fix the edges of walkways and driveways and fix any cracks.  Remove anything that might make you trip as you walk through a door, such as a raised step or threshold.  Trim any bushes or trees on the path to your home.  Use bright outdoor lighting.  Clear any walking paths of anything that might make someone trip, such as rocks or tools.  Regularly check to see if handrails are loose or broken. Make sure that both sides of any steps have handrails.  Any raised decks and porches should have guardrails on the edges.  Have any leaves, snow, or ice cleared regularly.  Use sand or salt on walking paths during winter.  Clean up any spills in your garage right away. This includes oil or grease spills. What can I do in the bathroom?  Use night lights.  Install grab bars by the toilet and in the tub and shower. Do not use towel bars as grab bars.  Use non-skid mats or decals in the tub or shower.  If you need to sit down in the shower, use a plastic, non-slip stool.  Keep the floor dry. Clean up any water that spills on the floor as soon as it happens.  Remove soap buildup in the tub or shower regularly.  Attach bath mats  securely with double-sided non-slip rug tape.  Do not have throw rugs and other things on the floor that can make you trip. What can I do in the bedroom?  Use night lights.  Make sure that you have a light by your bed that is easy to reach.  Do not use any sheets or blankets that are too big for your bed. They should not hang down onto the floor.  Have a firm chair that has side arms. You can use this for support while you get dressed.  Do not have throw rugs and other things on the floor that can make you trip. What can I do in the kitchen?  Clean up any spills right away.  Avoid walking on wet floors.  Keep items that you use a lot in easy-to-reach places.  If you need to reach something above you, use a strong step stool that has a grab bar.  Keep electrical cords out of the way.  Do not use floor polish or wax that makes floors slippery. If you must use wax, use non-skid floor wax.  Do not have throw rugs and other things on the floor that can make you  trip. What can I do with my stairs?  Do not leave any items on the stairs.  Make sure that there are handrails on both sides of the stairs and use them. Fix handrails that are broken or loose. Make sure that handrails are as long as the stairways.  Check any carpeting to make sure that it is firmly attached to the stairs. Fix any carpet that is loose or worn.  Avoid having throw rugs at the top or bottom of the stairs. If you do have throw rugs, attach them to the floor with carpet tape.  Make sure that you have a light switch at the top of the stairs and the bottom of the stairs. If you do not have them, ask someone to add them for you. What else can I do to help prevent falls?  Wear shoes that:  Do not have high heels.  Have rubber bottoms.  Are comfortable and fit you well.  Are closed at the toe. Do not wear sandals.  If you use a stepladder:  Make sure that it is fully opened. Do not climb a closed  stepladder.  Make sure that both sides of the stepladder are locked into place.  Ask someone to hold it for you, if possible.  Clearly mark and make sure that you can see:  Any grab bars or handrails.  First and last steps.  Where the edge of each step is.  Use tools that help you move around (mobility aids) if they are needed. These include:  Canes.  Walkers.  Scooters.  Crutches.  Turn on the lights when you go into a dark area. Replace any light bulbs as soon as they burn out.  Set up your furniture so you have a clear path. Avoid moving your furniture around.  If any of your floors are uneven, fix them.  If there are any pets around you, be aware of where they are.  Review your medicines with your doctor. Some medicines can make you feel dizzy. This can increase your chance of falling. Ask your doctor what other things that you can do to help prevent falls. This information is not intended to replace advice given to you by your health care provider. Make sure you discuss any questions you have with your health care provider. Document Released: 04/13/2009 Document Revised: 11/23/2015 Document Reviewed: 07/22/2014 Elsevier Interactive Patient Education  2017 [employer].

## 2020-01-06 ENCOUNTER — Encounter: Payer: Self-pay | Admitting: Family Medicine

## 2020-01-31 ENCOUNTER — Encounter: Payer: Medicare HMO | Admitting: Family Medicine

## 2020-02-17 ENCOUNTER — Encounter: Payer: Self-pay | Admitting: Family Medicine

## 2020-02-17 ENCOUNTER — Other Ambulatory Visit: Payer: Self-pay

## 2020-02-17 ENCOUNTER — Ambulatory Visit (INDEPENDENT_AMBULATORY_CARE_PROVIDER_SITE_OTHER): Payer: Medicare HMO | Admitting: Family Medicine

## 2020-02-17 VITALS — BP 145/77 | HR 64 | Temp 98.7°F | Ht 65.35 in | Wt 174.6 lb

## 2020-02-17 DIAGNOSIS — F332 Major depressive disorder, recurrent severe without psychotic features: Secondary | ICD-10-CM

## 2020-02-17 DIAGNOSIS — Z125 Encounter for screening for malignant neoplasm of prostate: Secondary | ICD-10-CM | POA: Diagnosis not present

## 2020-02-17 DIAGNOSIS — E538 Deficiency of other specified B group vitamins: Secondary | ICD-10-CM | POA: Diagnosis not present

## 2020-02-17 DIAGNOSIS — E782 Mixed hyperlipidemia: Secondary | ICD-10-CM | POA: Diagnosis not present

## 2020-02-17 DIAGNOSIS — R7301 Impaired fasting glucose: Secondary | ICD-10-CM

## 2020-02-17 DIAGNOSIS — Z0000 Encounter for general adult medical examination without abnormal findings: Secondary | ICD-10-CM

## 2020-02-17 DIAGNOSIS — I10 Essential (primary) hypertension: Secondary | ICD-10-CM

## 2020-02-17 DIAGNOSIS — I739 Peripheral vascular disease, unspecified: Secondary | ICD-10-CM

## 2020-02-17 LAB — URINALYSIS, ROUTINE W REFLEX MICROSCOPIC
Bilirubin, UA: NEGATIVE
Glucose, UA: NEGATIVE
Ketones, UA: NEGATIVE
Leukocytes,UA: NEGATIVE
Nitrite, UA: NEGATIVE
Protein,UA: NEGATIVE
RBC, UA: NEGATIVE
Specific Gravity, UA: 1.02 (ref 1.005–1.030)
Urobilinogen, Ur: 0.2 mg/dL (ref 0.2–1.0)
pH, UA: 5.5 (ref 5.0–7.5)

## 2020-02-17 LAB — MICROALBUMIN, URINE WAIVED
Creatinine, Urine Waived: 200 mg/dL (ref 10–300)
Microalb, Ur Waived: 10 mg/L (ref 0–19)
Microalb/Creat Ratio: <30 mg/g (ref <30)

## 2020-02-17 LAB — BAYER DCA HB A1C WAIVED: HB A1C (BAYER DCA - WAIVED): 6.2 % (ref <7.0)

## 2020-02-17 MED ORDER — VALSARTAN 40 MG PO TABS
40.0000 mg | ORAL_TABLET | Freq: Every day | ORAL | 1 refills | Status: DC
Start: 2020-02-17 — End: 2020-10-24

## 2020-02-17 MED ORDER — AMLODIPINE BESYLATE 10 MG PO TABS
10.0000 mg | ORAL_TABLET | Freq: Every day | ORAL | 1 refills | Status: DC
Start: 2020-02-17 — End: 2020-11-28

## 2020-02-17 MED ORDER — CYANOCOBALAMIN 1000 MCG/ML IJ SOLN: 1000.0000 ug | INTRAMUSCULAR | Status: AC

## 2020-02-17 MED ORDER — CYANOCOBALAMIN 1000 MCG/ML IJ SOLN
1000.0000 ug | INTRAMUSCULAR | Status: AC
  Administered 2020-02-17: 1000 ug via INTRAMUSCULAR

## 2020-02-17 MED ORDER — ROSUVASTATIN CALCIUM 5 MG PO TABS
5.0000 mg | ORAL_TABLET | ORAL | 1 refills | Status: DC
Start: 2020-02-17 — End: 2020-08-10

## 2020-02-17 NOTE — Progress Notes (Signed)
BP (!) 145/77 (BP Location: Left Arm, Patient Position: Sitting, Cuff Size: Normal)   Pulse 64   Temp 98.7 F (37.1 C) (Oral)   Ht 5' 5.35" (1.66 m)   Wt 174 lb 9.6 oz (79.2 kg)   SpO2 96%   BMI 28.74 kg/m    Subjective:    Patient ID: Nathan Fields, male    DOB: 1950/10/18, 69 y.o.   MRN: [id number]  HPI: Nathan Fields is a 69 y.o. male presenting on 02/17/2020 for comprehensive medical examination. Current medical complaints include:  Impaired Fasting Glucose HbA1C:  Lab Results  Component Value Date   HGBA1C 6.2 02/17/2020   Duration of elevated blood sugar: chronic Polydipsia: no Polyuria: no Weight change: no Visual disturbance: no Glucose Monitoring: no    Accucheck frequency: Not Checking Diabetic Education: Completed Family history of diabetes: yes  HYPERTENSION / HYPERLIPIDEMIA Satisfied with current treatment? yes Duration of hypertension: chronic BP monitoring frequency: not checking BP medication side effects: no Past BP meds: amlodipine, valsartan Duration of hyperlipidemia: chronic Cholesterol medication side effects: no Cholesterol supplements: none Past cholesterol medications: crestor Medication compliance: good compliance Aspirin: yes Recent stressors: yes Recurrent headaches: no Visual changes: no Palpitations: no Dyspnea: no Chest pain: no Lower extremity edema: no Dizzy/lightheaded: no  He currently lives with: wife Interim Problems from his last visit: no  Depression Screen done today and results listed below:  Depression screen [facility] 2/9 02/17/2020 11/24/2019 08/02/2019 01/12/2019 07/14/2018  Decreased Interest 3 0 0 0 0  Down, Depressed, Hopeless 1 0 0 0 0  PHQ - 2 Score 4 0 0 0 0  Altered sleeping 0 - 0 0 0  Tired, decreased energy 1 - 1 0 3  Change in appetite 0 - 1 0 0  Feeling bad or failure about yourself  0 - 0 0 0  Trouble concentrating 0 - 0 0 0  Moving slowly or fidgety/restless 0 - 0 0 0  Suicidal thoughts 0 - 0 0 0    PHQ-9 Score 5 - 2 0 3  Difficult doing work/chores - - Not difficult at all Not difficult at all -  Some recent data might be hidden    Past Medical History:  Past Medical History:  Diagnosis Date  . Anxiety   . Arthritis    right foot  . CAD (coronary artery disease)   . Chronic low back pain   . Diabetes mellitus without complication ([place])   . Diabetes mellitus without complication ([place])   . GERD (gastroesophageal reflux disease)   . Hyperlipidemia   . Hypertension   . Impaired fasting glucose   . Neuromuscular disorder ([place])   . Plantar fasciitis   . Syncope     Surgical History:  Past Surgical History:  Procedure Laterality Date  . CARDIAC CATHETERIZATION    . COLONOSCOPY  2011   Polyp removed, follow up in 5 years  . COLONOSCOPY WITH PROPOFOL N/A 01/14/2017   Procedure: COLONOSCOPY WITH PROPOFOL;  Surgeon: [name], MD;  Location: [facility] ENDOSCOPY;  Service: Endoscopy;  Laterality: N/A;    Medications:  Current Outpatient Medications on File Prior to Visit  Medication Sig  . aspirin 81 MG tablet Take 81 mg by mouth daily.   No current facility-administered medications on file prior to visit.    Allergies:  Allergies  Allergen Reactions  . Statins Other (See Comments)    fatigue fatigue  . Zetia [Ezetimibe] Other (See Comments)    Constipation  .  Niacin Rash  . Niacin And Related Rash    Social History:  Social History   Socioeconomic History  . Marital status: Married    Spouse name: Not on file  . Number of children: 2  . Years of education: 9th grade  . Highest education level: Not on file  Occupational History  . Not on file  Tobacco Use  . Smoking status: Never Smoker  . Smokeless tobacco: Never Used  Vaping Use  . Vaping Use: Never used  Substance and Sexual Activity  . Alcohol use: Yes    Alcohol/week: 0.0 standard drinks    Comment: on rare occasion  . Drug use: No  . Sexual activity: Not Currently  Other Topics Concern  . Not  on file  Social History Narrative  . Not on file   Social Determinants of Health   Financial Resource Strain:   . Difficulty of Paying Living Expenses: Not on file  Food Insecurity:   . Worried About Charity fundraiser in the Last Year: Not on file  . Ran Out of Food in the Last Year: Not on file  Transportation Needs:   . Lack of Transportation (Medical): Not on file  . Lack of Transportation (Non-Medical): Not on file  Physical Activity:   . Days of Exercise per Week: Not on file  . Minutes of Exercise per Session: Not on file  Stress:   . Feeling of Stress : Not on file  Social Connections:   . Frequency of Communication with Friends and Family: Not on file  . Frequency of Social Gatherings with Friends and Family: Not on file  . Attends Religious Services: Not on file  . Active Member of Clubs or Organizations: Not on file  . Attends Archivist Meetings: Not on file  . Marital Status: Not on file  Intimate Partner Violence:   . Fear of Current or Ex-Partner: Not on file  . Emotionally Abused: Not on file  . Physically Abused: Not on file  . Sexually Abused: Not on file   Social History   Tobacco Use  Smoking Status Never Smoker  Smokeless Tobacco Never Used   Social History   Substance and Sexual Activity  Alcohol Use Yes  . Alcohol/week: 0.0 standard drinks   Comment: on rare occasion    Family History:  Family History  Problem Relation Age of Onset  . Heart disease Mother   . Hyperlipidemia Mother   . Diabetes Mother   . Congestive Heart Failure Mother   . Hypertension Mother   . Diabetes Father   . Heart attack Father   . Hyperlipidemia Father     Past medical history, surgical history, medications, allergies, family history and social history reviewed with patient today and changes made to appropriate areas of the chart.   Review of Systems  Constitutional: Negative.   HENT: Negative.   Eyes: Negative.   Respiratory: Negative.     Cardiovascular: Negative.   Gastrointestinal: Negative.   Genitourinary: Negative.   Musculoskeletal: Positive for joint pain. Negative for back pain, falls, myalgias and neck pain.  Skin: Negative.   Neurological: Negative.   Endo/Heme/Allergies: Negative.   Psychiatric/Behavioral: Negative.     All other ROS negative except what is listed above and in the HPI.      Objective:    BP (!) 145/77 (BP Location: Left Arm, Patient Position: Sitting, Cuff Size: Normal)   Pulse 64   Temp 98.7 F (37.1 C) (Oral)  Ht 5' 5.35" (1.66 m)   Wt 174 lb 9.6 oz (79.2 kg)   SpO2 96%   BMI 28.74 kg/m   Wt Readings from Last 3 Encounters:  02/17/20 174 lb 9.6 oz (79.2 kg)  11/24/19 170 lb (77.1 kg)  03/01/19 166 lb (75.3 kg)    Physical Exam Vitals and nursing note reviewed.  Constitutional:      General: He is not in acute distress.    Appearance: Normal appearance. He is obese. He is not ill-appearing, toxic-appearing or diaphoretic.  HENT:     Head: Normocephalic and atraumatic.     Right Ear: Tympanic membrane, ear canal and external ear normal. There is no impacted cerumen.     Left Ear: Tympanic membrane, ear canal and external ear normal. There is no impacted cerumen.     Nose: Nose normal. No congestion or rhinorrhea.     Mouth/Throat:     Mouth: Mucous membranes are moist.     Pharynx: Oropharynx is clear. No oropharyngeal exudate or posterior oropharyngeal erythema.  Eyes:     General: No scleral icterus.       Right eye: No discharge.        Left eye: No discharge.     Extraocular Movements: Extraocular movements intact.     Conjunctiva/sclera: Conjunctivae normal.     Pupils: Pupils are equal, round, and reactive to light.  Neck:     Vascular: No carotid bruit.  Cardiovascular:     Rate and Rhythm: Normal rate and regular rhythm.     Pulses: Normal pulses.     Heart sounds: No murmur heard.  No friction rub. No gallop.   Pulmonary:     Effort: Pulmonary effort is  normal. No respiratory distress.     Breath sounds: Normal breath sounds. No stridor. No wheezing, rhonchi or rales.  Chest:     Chest wall: No tenderness.  Abdominal:     General: Abdomen is flat. Bowel sounds are normal. There is no distension.     Palpations: Abdomen is soft. There is no mass.     Tenderness: There is no abdominal tenderness. There is no right CVA tenderness, left CVA tenderness, guarding or rebound.     Hernia: No hernia is present.  Genitourinary:    Comments: Genital exam deferred with shared decision making Musculoskeletal:        General: No swelling, tenderness, deformity or signs of injury.     Cervical back: Normal range of motion and neck supple. No rigidity. No muscular tenderness.     Right lower leg: No edema.     Left lower leg: No edema.  Lymphadenopathy:     Cervical: No cervical adenopathy.  Skin:    General: Skin is warm and dry.     Capillary Refill: Capillary refill takes less than 2 seconds.     Coloration: Skin is not jaundiced or pale.     Findings: No bruising, erythema, lesion or rash.  Neurological:     General: No focal deficit present.     Mental Status: He is alert and oriented to person, place, and time.     Cranial Nerves: No cranial nerve deficit.     Sensory: No sensory deficit.     Motor: No weakness.     Coordination: Coordination normal.     Gait: Gait normal.     Deep Tendon Reflexes: Reflexes normal.  Psychiatric:        Mood and Affect: Mood normal.  Behavior: Behavior normal.        Thought Content: Thought content normal.        Judgment: Judgment normal.     Results for orders placed or performed in visit on 02/17/20  Bayer DCA Hb A1c Waived  Result Value Ref Range   HB A1C (BAYER DCA - WAIVED) 6.2 <7.0 %  CBC with Differential/Platelet  Result Value Ref Range   WBC 6.4 3.4 - 10.8 x10E3/uL   RBC 4.81 4.14 - 5.80 x10E6/uL   Hemoglobin 14.7 13.0 - 17.7 g/dL   Hematocrit 43.7 37.5 - 51.0 %   MCV 91 79 -  97 fL   MCH 30.6 26.6 - 33.0 pg   MCHC 33.6 31 - 35 g/dL   RDW 13.2 11.6 - 15.4 %   Platelets 175 150 - 450 x10E3/uL   Neutrophils 46 Not Estab. %   Lymphs 42 Not Estab. %   Monocytes 8 Not Estab. %   Eos 3 Not Estab. %   Basos 1 Not Estab. %   Neutrophils Absolute 3.0 1 - 7 x10E3/uL   Lymphocytes Absolute 2.7 0 - 3 x10E3/uL   Monocytes Absolute 0.5 0 - 0 x10E3/uL   EOS (ABSOLUTE) 0.2 0.0 - 0.4 x10E3/uL   Basophils Absolute 0.1 0 - 0 x10E3/uL   Immature Granulocytes 0 Not Estab. %   Immature Grans (Abs) 0.0 0.0 - 0.1 x10E3/uL  Comprehensive metabolic panel  Result Value Ref Range   Glucose 88 65 - 99 mg/dL   BUN 17 8 - 27 mg/dL   Creatinine, Ser 1.10 0.76 - 1.27 mg/dL   GFR calc non Af Amer 69 >59 mL/min/1.73   GFR calc Af Amer 79 >59 mL/min/1.73   BUN/Creatinine Ratio 15 10 - 24   Sodium 138 134 - 144 mmol/L   Potassium 4.4 3.5 - 5.2 mmol/L   Chloride 103 96 - 106 mmol/L   CO2 25 20 - 29 mmol/L   Calcium 9.8 8.6 - 10.2 mg/dL   Total Protein 6.8 6.0 - 8.5 g/dL   Albumin 4.6 3.8 - 4.8 g/dL   Globulin, Total 2.2 1.5 - 4.5 g/dL   Albumin/Globulin Ratio 2.1 1.2 - 2.2   Bilirubin Total 0.4 0.0 - 1.2 mg/dL   Alkaline Phosphatase 98 48 - 121 IU/L   AST 18 0 - 40 IU/L   ALT 16 0 - 44 IU/L  Lipid Panel w/o Chol/HDL Ratio  Result Value Ref Range   Cholesterol, Total 296 (H) 100 - 199 mg/dL   Triglycerides 178 (H) 0 - 149 mg/dL   HDL 45 >39 mg/dL   VLDL Cholesterol Cal 34 5 - 40 mg/dL   LDL Chol Calc (NIH) 217 (H) 0 - 99 mg/dL  Microalbumin, Urine Waived  Result Value Ref Range   Microalb, Ur Waived 10 0 - 19 mg/L   Creatinine, Urine Waived 200 10 - 300 mg/dL   Microalb/Creat Ratio <30 <30 mg/g  TSH  Result Value Ref Range   TSH 1.850 0.450 - 4.500 uIU/mL  Urinalysis, Routine w reflex microscopic  Result Value Ref Range   Specific Gravity, UA 1.020 1.005 - 1.030   pH, UA 5.5 5.0 - 7.5   Color, UA Yellow Yellow   Appearance Ur Clear Clear   Leukocytes,UA Negative  Negative   Protein,UA Negative Negative/Trace   Glucose, UA Negative Negative   Ketones, UA Negative Negative   RBC, UA Negative Negative   Bilirubin, UA Negative Negative   Urobilinogen, Ur 0.2 0.2 -  1.0 mg/dL   Nitrite, UA Negative Negative  B12  Result Value Ref Range   Vitamin B-12 600 232 - 1,245 pg/mL  PSA  Result Value Ref Range   Prostate Specific Ag, Serum 1.9 0.0 - 4.0 ng/mL      Assessment & Plan:   Problem List Items Addressed This Visit      Cardiovascular and Mediastinum   Hypertension    Running a little high. Work on [employer] and continue to monitor at home. Call with any concerns. Refills given today.       Relevant Medications   amLODipine (NORVASC) 10 MG tablet   rosuvastatin (CRESTOR) 5 MG tablet   valsartan (DIOVAN) 40 MG tablet   Other Relevant Orders   CBC with Differential/Platelet (Completed)   Comprehensive metabolic panel (Completed)   Microalbumin, Urine Waived (Completed)   TSH (Completed)     Endocrine   Impaired fasting glucose    Rechecking labs today. Await results. Treat today. Call with any concerns.       Relevant Orders   Bayer DCA Hb A1c Waived (Completed)   CBC with Differential/Platelet (Completed)   Comprehensive metabolic panel (Completed)   Microalbumin, Urine Waived (Completed)   Urinalysis, Routine w reflex microscopic (Completed)     Other   Hyperlipidemia    Under good control on current regimen. Continue current regimen. Continue to monitor. Call with any concerns. Refills given. Labs drawn today.        Relevant Medications   amLODipine (NORVASC) 10 MG tablet   rosuvastatin (CRESTOR) 5 MG tablet   valsartan (DIOVAN) 40 MG tablet   Other Relevant Orders   CBC with Differential/Platelet (Completed)   Comprehensive metabolic panel (Completed)   Lipid Panel w/o Chol/HDL Ratio (Completed)   Depression    Under good control on current regimen. Continue current regimen. Continue to monitor. Call with any  concerns. Refills given. Labs drawn today.       Relevant Orders   CBC with Differential/Platelet (Completed)   Comprehensive metabolic panel (Completed)   TSH (Completed)   B12 deficiency    Under good control on current regimen. Continue current regimen. Continue to monitor. Call with any concerns. Refills given. Labs drawn today.       Relevant Medications   cyanocobalamin ((VITAMIN B-12)) injection 1,000 mcg   cyanocobalamin ((VITAMIN B-12)) injection 1,000 mcg (Start on 03/16/2020 12:00 AM)   Other Relevant Orders   CBC with Differential/Platelet (Completed)   Comprehensive metabolic panel (Completed)   B12 (Completed)    Other Visit Diagnoses    Routine general medical examination at a health care facility    -  Primary   Vaccines refused. Screening labs checked today. Colonoscopy up to date. Continue diet and exercise. Call with any concerns.    Intermittent claudication [facility])       Referral to vascular surgery made today.    Relevant Orders   Ambulatory referral to Vascular Surgery   Screening for prostate cancer       Labs drawn today. Await results.    Relevant Orders   PSA (Completed)       Discussed aspirin prophylaxis for myocardial infarction prevention and decision was made to continue ASA  LABORATORY TESTING:  Health maintenance labs ordered today as discussed above.   The natural history of prostate cancer and ongoing controversy regarding screening and potential treatment outcomes of prostate cancer has been discussed with the patient. The meaning of a false positive PSA and a false  negative PSA has been discussed. He indicates understanding of the limitations of this screening test and wishes  to proceed with screening PSA testing.   IMMUNIZATIONS:   - Tdap: Tetanus vaccination status reviewed: Refused. - Influenza: Refused - Pneumovax: Refused - Prevnar: Refused - COVID: Refused  SCREENING: - Colonoscopy: Up to date  Discussed with patient purpose  of the colonoscopy is to detect colon cancer at curable precancerous or early stages   PATIENT COUNSELING:    Sexuality: Discussed sexually transmitted diseases, partner selection, use of condoms, avoidance of unintended pregnancy  and contraceptive alternatives.   Advised to avoid cigarette smoking.  I discussed with the patient that most people either abstain from alcohol or drink within safe limits (<=14/week and <=4 drinks/occasion for males, <=7/weeks and <= 3 drinks/occasion for females) and that the risk for alcohol disorders and other health effects rises proportionally with the number of drinks per week and how often a drinker exceeds daily limits.  Discussed cessation/primary prevention of drug use and availability of treatment for abuse.   Diet: Encouraged to adjust caloric intake to maintain  or achieve ideal body weight, to reduce intake of dietary saturated fat and total fat, to limit sodium intake by avoiding high sodium foods and not adding table salt, and to maintain adequate dietary potassium and calcium preferably from fresh fruits, vegetables, and low-fat dairy products.    stressed the importance of regular exercise  Injury prevention: Discussed safety belts, safety helmets, smoke detector, smoking near bedding or upholstery.   Dental health: Discussed importance of regular tooth brushing, flossing, and dental visits.   Follow up plan: NEXT PREVENTATIVE PHYSICAL DUE IN 1 YEAR. Return in about 6 months (around 08/19/2020).

## 2020-02-18 LAB — LIPID PANEL W/O CHOL/HDL RATIO
Cholesterol, Total: 296 mg/dL — ABNORMAL HIGH (ref 100–199)
HDL: 45 mg/dL (ref >39)
LDL Chol Calc (NIH): 217 mg/dL — ABNORMAL HIGH (ref 0–99)
Triglycerides: 178 mg/dL — ABNORMAL HIGH (ref 0–149)
VLDL Cholesterol Cal: 34 mg/dL (ref 5–40)

## 2020-02-18 LAB — CBC WITH DIFFERENTIAL/PLATELET
Basophils Absolute: 0.1 10*3/uL (ref 0.0–0.2)
Basos: 1 %
EOS (ABSOLUTE): 0.2 10*3/uL (ref 0.0–0.4)
Eos: 3 %
Hematocrit: 43.7 % (ref 37.5–51.0)
Hemoglobin: 14.7 g/dL (ref 13.0–17.7)
Immature Grans (Abs): 0 10*3/uL (ref 0.0–0.1)
Immature Granulocytes: 0 %
Lymphocytes Absolute: 2.7 10*3/uL (ref 0.7–3.1)
Lymphs: 42 %
MCH: 30.6 pg (ref 26.6–33.0)
MCHC: 33.6 g/dL (ref 31.5–35.7)
MCV: 91 fL (ref 79–97)
Monocytes Absolute: 0.5 10*3/uL (ref 0.1–0.9)
Monocytes: 8 %
Neutrophils Absolute: 3 10*3/uL (ref 1.4–7.0)
Neutrophils: 46 %
Platelets: 175 10*3/uL (ref 150–450)
RBC: 4.81 x10E6/uL (ref 4.14–5.80)
RDW: 13.2 % (ref 11.6–15.4)
WBC: 6.4 10*3/uL (ref 3.4–10.8)

## 2020-02-18 LAB — COMPREHENSIVE METABOLIC PANEL
ALT: 16 IU/L (ref 0–44)
AST: 18 IU/L (ref 0–40)
Albumin/Globulin Ratio: 2.1 (ref 1.2–2.2)
Albumin: 4.6 g/dL (ref 3.8–4.8)
Alkaline Phosphatase: 98 IU/L (ref 48–121)
BUN/Creatinine Ratio: 15 (ref 10–24)
BUN: 17 mg/dL (ref 8–27)
Bilirubin Total: 0.4 mg/dL (ref 0.0–1.2)
CO2: 25 mmol/L (ref 20–29)
Calcium: 9.8 mg/dL (ref 8.6–10.2)
Chloride: 103 mmol/L (ref 96–106)
Creatinine, Ser: 1.1 mg/dL (ref 0.76–1.27)
GFR calc Af Amer: 79 mL/min/{1.73_m2} (ref >59)
GFR calc non Af Amer: 69 mL/min/{1.73_m2} (ref >59)
Globulin, Total: 2.2 g/dL (ref 1.5–4.5)
Glucose: 88 mg/dL (ref 65–99)
Potassium: 4.4 mmol/L (ref 3.5–5.2)
Sodium: 138 mmol/L (ref 134–144)
Total Protein: 6.8 g/dL (ref 6.0–8.5)

## 2020-02-18 LAB — PSA: Prostate Specific Ag, Serum: 1.9 ng/mL (ref 0.0–4.0)

## 2020-02-18 LAB — TSH: TSH: 1.85 u[IU]/mL (ref 0.450–4.500)

## 2020-02-18 LAB — VITAMIN B12: Vitamin B-12: 600 pg/mL (ref 232–1245)

## 2020-02-20 ENCOUNTER — Encounter: Payer: Self-pay | Admitting: Family Medicine

## 2020-02-20 NOTE — Assessment & Plan Note (Signed)
Running a little high. Work on [employer] and continue to monitor at home. Call with any concerns. Refills given today.

## 2020-02-20 NOTE — Assessment & Plan Note (Signed)
Under good control on current regimen. Continue current regimen. Continue to monitor. Call with any concerns. Refills given. Labs drawn today.   

## 2020-02-20 NOTE — Assessment & Plan Note (Signed)
Rechecking labs today. Await results. Treat today. Call with any concerns.

## 2020-02-27 ENCOUNTER — Encounter: Payer: Self-pay | Admitting: Family Medicine

## 2020-08-10 ENCOUNTER — Other Ambulatory Visit: Payer: Self-pay | Admitting: Family Medicine

## 2020-10-18 ENCOUNTER — Other Ambulatory Visit: Payer: Self-pay | Admitting: Family Medicine

## 2020-10-18 NOTE — Telephone Encounter (Signed)
Requested medication (s) are due for refill today: yes  Requested medication (s) are on the active medication list: yes  Last refill:  07/26/2020  Future visit scheduled:no   Notes to clinic:  over due for 6 month follow up appt   Requested Prescriptions  Pending Prescriptions Disp Refills   valsartan (DIOVAN) 40 MG tablet [Pharmacy Med Name: VALSARTAN 40 MG TABLET] 90 tablet 1    Sig: TAKE 1 TABLET BY MOUTH EVERY DAY      Cardiovascular:  Angiotensin Receptor Blockers Failed - 10/18/2020 12:44 PM      Failed - Cr in normal range and within 180 days    Creatinine  Date Value Ref Range Status  07/19/2012 1.12 0.60 - 1.30 mg/dL Final   Creatinine, Ser  Date Value Ref Range Status  02/17/2020 1.10 0.76 - 1.27 mg/dL Final          Failed - K in normal range and within 180 days    Potassium  Date Value Ref Range Status  02/17/2020 4.4 3.5 - 5.2 mmol/L Final  07/19/2012 3.6 3.5 - 5.1 mmol/L Final          Failed - Last BP in normal range    BP Readings from Last 1 Encounters:  02/17/20 (!) 145/77          Failed - Valid encounter within last 6 months    Recent Outpatient Visits           8 months ago Routine general medical examination at a health care facility   [place], [place], DO   1 year ago Essential hypertension   [place], Barb Merino, DO   1 year ago Essential hypertension   [place], [place], DO   1 year ago Essential hypertension   [place], [place], DO   2 years ago Essential hypertension   [place], [place], DO       Future Appointments             In 1 month  [place], [facility]   In 2 months Ralene Bathe, MD [place] - Patient is not pregnant

## 2020-10-18 NOTE — Telephone Encounter (Signed)
Lvm to make this apt. 

## 2020-10-23 NOTE — Telephone Encounter (Signed)
Lvm to make this apt. 

## 2020-10-24 NOTE — Telephone Encounter (Signed)
Pt is scheduled for 11/20/2020

## 2020-10-25 ENCOUNTER — Other Ambulatory Visit: Payer: Self-pay | Admitting: Family Medicine

## 2020-11-10 ENCOUNTER — Other Ambulatory Visit: Payer: Self-pay | Admitting: Family Medicine

## 2020-11-20 ENCOUNTER — Ambulatory Visit: Payer: Medicare HMO | Admitting: Family Medicine

## 2020-11-21 ENCOUNTER — Telehealth: Payer: Self-pay | Admitting: Family Medicine

## 2020-11-21 NOTE — Telephone Encounter (Signed)
11/21/20 LM re: awv rescheduled to due [facility] schedule change new appt December 04, 2020 at 2:30 by telephone-srs

## 2020-11-24 ENCOUNTER — Ambulatory Visit: Payer: Medicare HMO

## 2020-11-28 ENCOUNTER — Encounter: Payer: Self-pay | Admitting: Family Medicine

## 2020-11-28 ENCOUNTER — Ambulatory Visit: Payer: Medicare HMO | Admitting: Family Medicine

## 2020-11-28 ENCOUNTER — Other Ambulatory Visit: Payer: Self-pay

## 2020-11-28 VITALS — BP 156/86 | HR 68 | Temp 98.2°F | Ht 65.0 in | Wt 164.4 lb

## 2020-11-28 DIAGNOSIS — I10 Essential (primary) hypertension: Secondary | ICD-10-CM

## 2020-11-28 DIAGNOSIS — R7301 Impaired fasting glucose: Secondary | ICD-10-CM

## 2020-11-28 DIAGNOSIS — E782 Mixed hyperlipidemia: Secondary | ICD-10-CM

## 2020-11-28 DIAGNOSIS — F332 Major depressive disorder, recurrent severe without psychotic features: Secondary | ICD-10-CM

## 2020-11-28 DIAGNOSIS — E538 Deficiency of other specified B group vitamins: Secondary | ICD-10-CM

## 2020-11-28 DIAGNOSIS — F419 Anxiety disorder, unspecified: Secondary | ICD-10-CM | POA: Diagnosis not present

## 2020-11-28 DIAGNOSIS — I2510 Atherosclerotic heart disease of native coronary artery without angina pectoris: Secondary | ICD-10-CM

## 2020-11-28 MED ORDER — VALSARTAN 40 MG PO TABS: 40.0000 mg | ORAL_TABLET | Freq: Every day | ORAL | 1 refills | Status: DC

## 2020-11-28 MED ORDER — ROSUVASTATIN CALCIUM 5 MG PO TABS: 5.0000 mg | ORAL_TABLET | ORAL | 1 refills | Status: DC

## 2020-11-28 MED ORDER — AMLODIPINE BESYLATE 2.5 MG PO TABS: 2.5000 mg | ORAL_TABLET | Freq: Every day | ORAL | 1 refills | Status: DC

## 2020-11-28 NOTE — Progress Notes (Signed)
BP (!) 156/86   Pulse 68   Temp 98.2 F (36.8 C)   Ht 5' 5"  (1.651 m)   Wt 164 lb 6.4 oz (74.6 kg)   SpO2 98%   BMI 27.36 kg/m    Subjective:    Patient ID: Nathan Fields, male    DOB: 01/25/1951, 70 y.o.   MRN: [id number]  HPI: Nathan Fields is a 70 y.o. male  Chief Complaint  Patient presents with  . Coronary Artery Disease  . Hypertension  . Hyperlipidemia  . Depression  . IFG   HYPERTENSION / HYPERLIPIDEMIA- has not been taking his amlodipine as he has been feeling dizzy on it Satisfied with current treatment? yes Duration of hypertension: chronic BP monitoring frequency: not checking BP medication side effects: no Past BP meds: valsartan, amlodipine Duration of hyperlipidemia: chronic Cholesterol medication side effects: no Cholesterol supplements: none Past cholesterol medications: crestor Medication compliance: excellent compliance Aspirin: yes Recent stressors: no Recurrent headaches: no Visual changes: no Palpitations: no Dyspnea: no Chest pain: no Lower extremity edema: no Dizzy/lightheaded: no  Impaired Fasting Glucose HbA1C:  Lab Results  Component Value Date   HGBA1C 6.2 11/28/2020   Duration of elevated blood sugar: chronic Polydipsia: no Polyuria: no Weight change: no Visual disturbance: no Glucose Monitoring: no    Accucheck frequency: Not Checking Diabetic Education: Not Completed Family history of diabetes: no  DEPRESSION Mood status: stable Satisfied with current treatment?: yes Symptom severity: mild  Duration of current treatment : not on anything Depressed mood: no Anxious mood: no Anhedonia: no Significant weight loss or gain: no Insomnia: no  Fatigue: no Feelings of worthlessness or guilt: no Impaired concentration/indecisiveness: no Suicidal ideations: no Hopelessness: no Crying spells: no Depression screen [facility] 2/9 11/28/2020 02/17/2020 11/24/2019 08/02/2019 01/12/2019  Decreased Interest 0 3 0 0 0  Down,  Depressed, Hopeless 0 1 0 0 0  PHQ - 2 Score 0 4 0 0 0  Altered sleeping 0 0 - 0 0  Tired, decreased energy 0 1 - 1 0  Change in appetite 0 0 - 1 0  Feeling bad or failure about yourself  0 0 - 0 0  Trouble concentrating 0 0 - 0 0  Moving slowly or fidgety/restless 0 0 - 0 0  Suicidal thoughts 0 0 - 0 0  PHQ-9 Score 0 5 - 2 0  Difficult doing work/chores Not difficult at all - - Not difficult at all Not difficult at all  Some recent data might be hidden   Has "spells" where he gets dizzy and sweaty. He will not be SOB or have CP. He has not seen his cardiologist. He is not sure how often this happens. It has not happened this week.   Relevant past medical, surgical, family and social history reviewed and updated as indicated. Interim medical history since our last visit reviewed. Allergies and medications reviewed and updated.  Review of Systems  Constitutional: Negative.   Respiratory: Negative.   Cardiovascular: Negative.   Gastrointestinal: Negative.   Musculoskeletal: Negative.   Neurological: Negative.   Psychiatric/Behavioral: Negative.     Per HPI unless specifically indicated above     Objective:    BP (!) 156/86   Pulse 68   Temp 98.2 F (36.8 C)   Ht 5' 5"  (1.651 m)   Wt 164 lb 6.4 oz (74.6 kg)   SpO2 98%   BMI 27.36 kg/m   Wt Readings from Last 3 Encounters:  11/28/20 164 lb 6.4  oz (74.6 kg)  02/17/20 174 lb 9.6 oz (79.2 kg)  11/24/19 170 lb (77.1 kg)    Physical Exam Vitals and nursing note reviewed.  Constitutional:      General: He is not in acute distress.    Appearance: Normal appearance. He is not ill-appearing, toxic-appearing or diaphoretic.  HENT:     Head: Normocephalic and atraumatic.     Right Ear: External ear normal.     Left Ear: External ear normal.     Nose: Nose normal.     Mouth/Throat:     Mouth: Mucous membranes are moist.     Pharynx: Oropharynx is clear.  Eyes:     General: No scleral icterus.       Right eye: No  discharge.        Left eye: No discharge.     Extraocular Movements: Extraocular movements intact.     Conjunctiva/sclera: Conjunctivae normal.     Pupils: Pupils are equal, round, and reactive to light.  Cardiovascular:     Rate and Rhythm: Normal rate and regular rhythm.     Pulses: Normal pulses.     Heart sounds: Normal heart sounds. No murmur heard. No friction rub. No gallop.   Pulmonary:     Effort: Pulmonary effort is normal. No respiratory distress.     Breath sounds: Normal breath sounds. No stridor. No wheezing, rhonchi or rales.  Chest:     Chest wall: No tenderness.  Musculoskeletal:        General: Normal range of motion.     Cervical back: Normal range of motion and neck supple.  Skin:    General: Skin is warm and dry.     Capillary Refill: Capillary refill takes less than 2 seconds.     Coloration: Skin is not jaundiced or pale.     Findings: No bruising, erythema, lesion or rash.  Neurological:     General: No focal deficit present.     Mental Status: He is alert and oriented to person, place, and time. Mental status is at baseline.  Psychiatric:        Mood and Affect: Mood normal.        Behavior: Behavior normal.        Thought Content: Thought content normal.        Judgment: Judgment normal.     Results for orders placed or performed in visit on 11/28/20  CBC with Differential/Platelet  Result Value Ref Range   WBC 6.8 3.4 - 10.8 x10E3/uL   RBC 4.56 4.14 - 5.80 x10E6/uL   Hemoglobin 13.8 13.0 - 17.7 g/dL   Hematocrit 40.8 37.5 - 51.0 %   MCV 90 79 - 97 fL   MCH 30.3 26.6 - 33.0 pg   MCHC 33.8 31.5 - 35.7 g/dL   RDW 12.2 11.6 - 15.4 %   Platelets 223 150 - 450 x10E3/uL   Neutrophils 51 Not Estab. %   Lymphs 37 Not Estab. %   Monocytes 8 Not Estab. %   Eos 3 Not Estab. %   Basos 1 Not Estab. %   Neutrophils Absolute 3.5 1.4 - 7.0 x10E3/uL   Lymphocytes Absolute 2.5 0.7 - 3.1 x10E3/uL   Monocytes Absolute 0.5 0.1 - 0.9 x10E3/uL   EOS (ABSOLUTE)  0.2 0.0 - 0.4 x10E3/uL   Basophils Absolute 0.1 0.0 - 0.2 x10E3/uL   Immature Granulocytes 0 Not Estab. %   Immature Grans (Abs) 0.0 0.0 - 0.1 x10E3/uL  Bayer DCA Hb  A1c Waived  Result Value Ref Range   HB A1C (BAYER DCA - WAIVED) 6.2 <7.0 %  Comprehensive metabolic panel  Result Value Ref Range   Glucose 101 (H) 65 - 99 mg/dL   BUN 17 8 - 27 mg/dL   Creatinine, Ser 1.00 0.76 - 1.27 mg/dL   eGFR 81 >59 mL/min/1.73   BUN/Creatinine Ratio 17 10 - 24   Sodium 142 134 - 144 mmol/L   Potassium 4.3 3.5 - 5.2 mmol/L   Chloride 106 96 - 106 mmol/L   CO2 22 20 - 29 mmol/L   Calcium 9.7 8.6 - 10.2 mg/dL   Total Protein 6.6 6.0 - 8.5 g/dL   Albumin 4.6 3.8 - 4.8 g/dL   Globulin, Total 2.0 1.5 - 4.5 g/dL   Albumin/Globulin Ratio 2.3 (H) 1.2 - 2.2   Bilirubin Total 0.3 0.0 - 1.2 mg/dL   Alkaline Phosphatase 99 44 - 121 IU/L   AST 21 0 - 40 IU/L   ALT 19 0 - 44 IU/L  Lipid Panel w/o Chol/HDL Ratio  Result Value Ref Range   Cholesterol, Total 201 (H) 100 - 199 mg/dL   Triglycerides 162 (H) 0 - 149 mg/dL   HDL 44 >39 mg/dL   VLDL Cholesterol Cal 29 5 - 40 mg/dL   LDL Chol Calc (NIH) 128 (H) 0 - 99 mg/dL  B12  Result Value Ref Range   Vitamin B-12 657 232 - 1,245 pg/mL      Assessment & Plan:   Problem List Items Addressed This Visit      Cardiovascular and Mediastinum   CAD (coronary artery disease) - Primary    Has not seen his cardiologist in quite a while. Will get him back into cardiology ASAP. Continue to monitor.       Relevant Medications   amLODipine (NORVASC) 2.5 MG tablet   valsartan (DIOVAN) 40 MG tablet   rosuvastatin (CRESTOR) 5 MG tablet   Other Relevant Orders   CBC with Differential/Platelet (Completed)   Comprehensive metabolic panel (Completed)   Ambulatory referral to Cardiology   Hypertension    Not under good control. Will restart his amlodpine at a lower dose. Recheck 1 month. Continue valsartan.       Relevant Medications   amLODipine (NORVASC) 2.5  MG tablet   valsartan (DIOVAN) 40 MG tablet   rosuvastatin (CRESTOR) 5 MG tablet   Other Relevant Orders   CBC with Differential/Platelet (Completed)   Comprehensive metabolic panel (Completed)     Endocrine   Impaired fasting glucose    Rechecking labs today. Await results. Treat as needed.       Relevant Orders   CBC with Differential/Platelet (Completed)   Bayer DCA Hb A1c Waived (Completed)   Comprehensive metabolic panel (Completed)     Other   Hyperlipidemia    Under good control on current regimen. Continue current regimen. Continue to monitor. Call with any concerns. Refills given. Labs drawn today.        Relevant Medications   amLODipine (NORVASC) 2.5 MG tablet   valsartan (DIOVAN) 40 MG tablet   rosuvastatin (CRESTOR) 5 MG tablet   Other Relevant Orders   CBC with Differential/Platelet (Completed)   Comprehensive metabolic panel (Completed)   Lipid Panel w/o Chol/HDL Ratio (Completed)   Depression    Doing well off medicine. Continue to monitor. Call with any concerns.       Relevant Orders   CBC with Differential/Platelet (Completed)   Comprehensive metabolic panel (Completed)  Acute anxiety    Doing OK off medicine. Continue to monitor. Call with any concerns.       Relevant Orders   CBC with Differential/Platelet (Completed)   Comprehensive metabolic panel (Completed)   B12 deficiency    Rechecking labs today. Await results. Treat as needed.       Relevant Orders   CBC with Differential/Platelet (Completed)   Comprehensive metabolic panel (Completed)   B12 (Completed)       Follow up plan: Return in about 4 weeks (around 12/26/2020) for physical.

## 2020-11-29 LAB — CBC WITH DIFFERENTIAL/PLATELET
Basophils Absolute: 0.1 10*3/uL (ref 0.0–0.2)
Basos: 1 %
EOS (ABSOLUTE): 0.2 10*3/uL (ref 0.0–0.4)
Eos: 3 %
Hematocrit: 40.8 % (ref 37.5–51.0)
Hemoglobin: 13.8 g/dL (ref 13.0–17.7)
Immature Grans (Abs): 0 10*3/uL (ref 0.0–0.1)
Immature Granulocytes: 0 %
Lymphocytes Absolute: 2.5 10*3/uL (ref 0.7–3.1)
Lymphs: 37 %
MCH: 30.3 pg (ref 26.6–33.0)
MCHC: 33.8 g/dL (ref 31.5–35.7)
MCV: 90 fL (ref 79–97)
Monocytes Absolute: 0.5 10*3/uL (ref 0.1–0.9)
Monocytes: 8 %
Neutrophils Absolute: 3.5 10*3/uL (ref 1.4–7.0)
Neutrophils: 51 %
Platelets: 223 10*3/uL (ref 150–450)
RBC: 4.56 x10E6/uL (ref 4.14–5.80)
RDW: 12.2 % (ref 11.6–15.4)
WBC: 6.8 10*3/uL (ref 3.4–10.8)

## 2020-11-29 LAB — LIPID PANEL W/O CHOL/HDL RATIO
Cholesterol, Total: 201 mg/dL — ABNORMAL HIGH (ref 100–199)
HDL: 44 mg/dL (ref >39)
LDL Chol Calc (NIH): 128 mg/dL — ABNORMAL HIGH (ref 0–99)
Triglycerides: 162 mg/dL — ABNORMAL HIGH (ref 0–149)
VLDL Cholesterol Cal: 29 mg/dL (ref 5–40)

## 2020-11-29 LAB — COMPREHENSIVE METABOLIC PANEL
ALT: 19 IU/L (ref 0–44)
AST: 21 IU/L (ref 0–40)
Albumin/Globulin Ratio: 2.3 — ABNORMAL HIGH (ref 1.2–2.2)
Albumin: 4.6 g/dL (ref 3.8–4.8)
Alkaline Phosphatase: 99 IU/L (ref 44–121)
BUN/Creatinine Ratio: 17 (ref 10–24)
BUN: 17 mg/dL (ref 8–27)
Bilirubin Total: 0.3 mg/dL (ref 0.0–1.2)
CO2: 22 mmol/L (ref 20–29)
Calcium: 9.7 mg/dL (ref 8.6–10.2)
Chloride: 106 mmol/L (ref 96–106)
Creatinine, Ser: 1 mg/dL (ref 0.76–1.27)
Globulin, Total: 2 g/dL (ref 1.5–4.5)
Glucose: 101 mg/dL — ABNORMAL HIGH (ref 65–99)
Potassium: 4.3 mmol/L (ref 3.5–5.2)
Sodium: 142 mmol/L (ref 134–144)
Total Protein: 6.6 g/dL (ref 6.0–8.5)
eGFR: 81 mL/min/{1.73_m2} (ref >59)

## 2020-11-29 LAB — BAYER DCA HB A1C WAIVED: HB A1C (BAYER DCA - WAIVED): 6.2 % (ref <7.0)

## 2020-11-29 LAB — VITAMIN B12: Vitamin B-12: 657 pg/mL (ref 232–1245)

## 2020-11-30 ENCOUNTER — Ambulatory Visit: Payer: Medicare HMO

## 2020-12-03 NOTE — Assessment & Plan Note (Signed)
Under good control on current regimen. Continue current regimen. Continue to monitor. Call with any concerns. Refills given. Labs drawn today.   

## 2020-12-03 NOTE — Assessment & Plan Note (Signed)
Not under good control. Will restart his amlodpine at a lower dose. Recheck 1 month. Continue valsartan.

## 2020-12-03 NOTE — Assessment & Plan Note (Signed)
Has not seen his cardiologist in quite a while. Will get him back into cardiology ASAP. Continue to monitor.

## 2020-12-03 NOTE — Assessment & Plan Note (Signed)
Doing well off medicine. Continue to monitor. Call with any concerns.  

## 2020-12-03 NOTE — Assessment & Plan Note (Signed)
Rechecking labs today. Await results. Treat as needed.  °

## 2020-12-03 NOTE — Assessment & Plan Note (Signed)
Doing OK off medicine. Continue to monitor. Call with any concerns.

## 2020-12-04 ENCOUNTER — Ambulatory Visit (INDEPENDENT_AMBULATORY_CARE_PROVIDER_SITE_OTHER): Payer: Medicare HMO

## 2020-12-04 VITALS — Ht 66.0 in | Wt 165.0 lb

## 2020-12-04 DIAGNOSIS — Z0000 Encounter for general adult medical examination without abnormal findings: Secondary | ICD-10-CM

## 2020-12-04 NOTE — Patient Instructions (Signed)
Mr. Nathan Fields , Thank you for taking time to come for your Medicare Wellness Visit. I appreciate your ongoing commitment to your health goals. Please review the following plan we discussed and let me know if I can assist you in the future.   Screening recommendations/referrals: Colonoscopy: completed 01/14/2017, due 01/14/2022 Recommended yearly ophthalmology/optometry visit for glaucoma screening and checkup Recommended yearly dental visit for hygiene and checkup  Vaccinations: Influenza vaccine: decline Pneumococcal vaccine: decline Tdap vaccine: decline Shingles vaccine: decline   Covid-19:  decline  Advanced directives: Please bring a copy of your POA (Power of Attorney) and/or Living Will to your next appointment.   Conditions/risks identified: none  Next appointment: Follow up in one year for your annual wellness visit.   Preventive Care 69 Years and Older, Male Preventive care refers to lifestyle choices and visits with your health care provider that can promote health and wellness. What does preventive care include?  A yearly physical exam. This is also called an annual well check.  Dental exams once or twice a year.  Routine eye exams. Ask your health care provider how often you should have your eyes checked.  Personal lifestyle choices, including:  Daily care of your teeth and gums.  Regular physical activity.  Eating a healthy diet.  Avoiding tobacco and drug use.  Limiting alcohol use.  Practicing safe sex.  Taking low doses of aspirin every day.  Taking vitamin and mineral supplements as recommended by your health care provider. What happens during an annual well check? The services and screenings done by your health care provider during your annual well check will depend on your age, overall health, lifestyle risk factors, and family history of disease. Counseling  Your health care provider may ask you questions about your:  Alcohol use.  Tobacco  use.  Drug use.  Emotional well-being.  Home and relationship well-being.  Sexual activity.  Eating habits.  History of falls.  Memory and ability to understand (cognition).  Work and work Statistician. Screening  You may have the following tests or measurements:  Height, weight, and BMI.  Blood pressure.  Lipid and cholesterol levels. These may be checked every 5 years, or more frequently if you are over 72 years old.  Skin check.  Lung cancer screening. You may have this screening every year starting at age 86 if you have a 30-pack-year history of smoking and currently smoke or have quit within the past 15 years.  Fecal occult blood test (FOBT) of the stool. You may have this test every year starting at age 71.  Flexible sigmoidoscopy or colonoscopy. You may have a sigmoidoscopy every 5 years or a colonoscopy every 10 years starting at age 54.  Prostate cancer screening. Recommendations will vary depending on your family history and other risks.  Hepatitis C blood test.  Hepatitis B blood test.  Sexually transmitted disease (STD) testing.  Diabetes screening. This is done by checking your blood sugar (glucose) after you have not eaten for a while (fasting). You may have this done every 1-3 years.  Abdominal aortic aneurysm (AAA) screening. You may need this if you are a current or former smoker.  Osteoporosis. You may be screened starting at age 73 if you are at high risk. Talk with your health care provider about your test results, treatment options, and if necessary, the need for more tests. Vaccines  Your health care provider may recommend certain vaccines, such as:  Influenza vaccine. This is recommended every year.  Tetanus, diphtheria,  and acellular pertussis (Tdap, Td) vaccine. You may need a Td booster every 10 years.  Zoster vaccine. You may need this after age 81.  Pneumococcal 13-valent conjugate (PCV13) vaccine. One dose is recommended after age  29.  Pneumococcal polysaccharide (PPSV23) vaccine. One dose is recommended after age 35. Talk to your health care provider about which screenings and vaccines you need and how often you need them. This information is not intended to replace advice given to you by your health care provider. Make sure you discuss any questions you have with your health care provider. Document Released: 07/14/2015 Document Revised: 03/06/2016 Document Reviewed: 04/18/2015 Elsevier Interactive Patient Education  2017 [employer] Prevention in the Home Falls can cause injuries. They can happen to people of all ages. There are many things you can do to make your home safe and to help prevent falls. What can I do on the outside of my home?  Regularly fix the edges of walkways and driveways and fix any cracks.  Remove anything that might make you trip as you walk through a door, such as a raised step or threshold.  Trim any bushes or trees on the path to your home.  Use bright outdoor lighting.  Clear any walking paths of anything that might make someone trip, such as rocks or tools.  Regularly check to see if handrails are loose or broken. Make sure that both sides of any steps have handrails.  Any raised decks and porches should have guardrails on the edges.  Have any leaves, snow, or ice cleared regularly.  Use sand or salt on walking paths during winter.  Clean up any spills in your garage right away. This includes oil or grease spills. What can I do in the bathroom?  Use night lights.  Install grab bars by the toilet and in the tub and shower. Do not use towel bars as grab bars.  Use non-skid mats or decals in the tub or shower.  If you need to sit down in the shower, use a plastic, non-slip stool.  Keep the floor dry. Clean up any water that spills on the floor as soon as it happens.  Remove soap buildup in the tub or shower regularly.  Attach bath mats securely with double-sided  non-slip rug tape.  Do not have throw rugs and other things on the floor that can make you trip. What can I do in the bedroom?  Use night lights.  Make sure that you have a light by your bed that is easy to reach.  Do not use any sheets or blankets that are too big for your bed. They should not hang down onto the floor.  Have a firm chair that has side arms. You can use this for support while you get dressed.  Do not have throw rugs and other things on the floor that can make you trip. What can I do in the kitchen?  Clean up any spills right away.  Avoid walking on wet floors.  Keep items that you use a lot in easy-to-reach places.  If you need to reach something above you, use a strong step stool that has a grab bar.  Keep electrical cords out of the way.  Do not use floor polish or wax that makes floors slippery. If you must use wax, use non-skid floor wax.  Do not have throw rugs and other things on the floor that can make you trip. What can I do with my  stairs?  Do not leave any items on the stairs.  Make sure that there are handrails on both sides of the stairs and use them. Fix handrails that are broken or loose. Make sure that handrails are as long as the stairways.  Check any carpeting to make sure that it is firmly attached to the stairs. Fix any carpet that is loose or worn.  Avoid having throw rugs at the top or bottom of the stairs. If you do have throw rugs, attach them to the floor with carpet tape.  Make sure that you have a light switch at the top of the stairs and the bottom of the stairs. If you do not have them, ask someone to add them for you. What else can I do to help prevent falls?  Wear shoes that:  Do not have high heels.  Have rubber bottoms.  Are comfortable and fit you well.  Are closed at the toe. Do not wear sandals.  If you use a stepladder:  Make sure that it is fully opened. Do not climb a closed stepladder.  Make sure that both  sides of the stepladder are locked into place.  Ask someone to hold it for you, if possible.  Clearly mark and make sure that you can see:  Any grab bars or handrails.  First and last steps.  Where the edge of each step is.  Use tools that help you move around (mobility aids) if they are needed. These include:  Canes.  Walkers.  Scooters.  Crutches.  Turn on the lights when you go into a dark area. Replace any light bulbs as soon as they burn out.  Set up your furniture so you have a clear path. Avoid moving your furniture around.  If any of your floors are uneven, fix them.  If there are any pets around you, be aware of where they are.  Review your medicines with your doctor. Some medicines can make you feel dizzy. This can increase your chance of falling. Ask your doctor what other things that you can do to help prevent falls. This information is not intended to replace advice given to you by your health care provider. Make sure you discuss any questions you have with your health care provider. Document Released: 04/13/2009 Document Revised: 11/23/2015 Document Reviewed: 07/22/2014 Elsevier Interactive Patient Education  2017 [employer].

## 2020-12-04 NOTE — Progress Notes (Signed)
I connected with Nathan Fields today by telephone and verified that I am speaking with the correct person using two identifiers. Location patient: home Location provider: work Persons participating in the virtual visit: Nadia, Torr LPN.   I discussed the limitations, risks, security and privacy concerns of performing an evaluation and management service by telephone and the availability of in person appointments. I also discussed with the patient that there may be a patient responsible charge related to this service. The patient expressed understanding and verbally consented to this telephonic visit.    Interactive audio and video telecommunications were attempted between this provider and patient, however failed, due to patient having technical difficulties OR patient did not have access to video capability.  We continued and completed visit with audio only.     Vital signs may be patient reported or missing.  Subjective:   Nathan Fields is a 70 y.o. male who presents for Medicare Annual/Subsequent preventive examination.  Review of Systems     Cardiac Risk Factors include: advanced age (>32men, >32 women);hypertension;male gender     Objective:    Today's Vitals   12/04/20 1427  Weight: 165 lb (74.8 kg)  Height: 5\' 6"  (1.676 m)   Body mass index is 26.63 kg/m.  Advanced Directives 12/04/2020 11/24/2019 08/22/2017 01/14/2017 10/08/2016 02/15/2015  Does Patient Have a Medical Advance Directive? Yes Yes Yes Yes Yes Yes  Type of Paramedic of [place];Living will Living will;Healthcare Power of [place];Living will [place];Living will [place];Living will -  Does patient want to make changes to medical advance directive? - - - - No - Patient declined -  Copy of [place] in Chart? No - copy requested No - copy requested - No - copy requested No - copy  requested -    Current Medications (verified) Outpatient Encounter Medications as of 12/04/2020  Medication Sig  . amLODipine (NORVASC) 2.5 MG tablet Take 1 tablet (2.5 mg total) by mouth daily.  Marland Kitchen aspirin 81 MG tablet Take 81 mg by mouth daily.  . rosuvastatin (CRESTOR) 5 MG tablet Take 1 tablet (5 mg total) by mouth every other day.  . valsartan (DIOVAN) 40 MG tablet Take 1 tablet (40 mg total) by mouth daily.   Facility-Administered Encounter Medications as of 12/04/2020  Medication  . cyanocobalamin ((VITAMIN B-12)) injection 1,000 mcg    Allergies (verified) Statins, Zetia [ezetimibe], Niacin, and Niacin and related   History: Past Medical History:  Diagnosis Date  . Anxiety   . Arthritis    right foot  . CAD (coronary artery disease)   . Chronic low back pain   . Diabetes mellitus without complication ([place])   . Diabetes mellitus without complication ([place])   . GERD (gastroesophageal reflux disease)   . Hyperlipidemia   . Hypertension   . Impaired fasting glucose   . Neuromuscular disorder ([place])   . Plantar fasciitis   . Syncope    Past Surgical History:  Procedure Laterality Date  . CARDIAC CATHETERIZATION    . COLONOSCOPY  2011   Polyp removed, follow up in 5 years  . COLONOSCOPY WITH PROPOFOL N/A 01/14/2017   Procedure: COLONOSCOPY WITH PROPOFOL;  Surgeon: [name], MD;  Location: [facility] ENDOSCOPY;  Service: Endoscopy;  Laterality: N/A;   Family History  Problem Relation Age of Onset  . Heart disease Mother   . Hyperlipidemia Mother   . Diabetes Mother   .  Congestive Heart Failure Mother   . Hypertension Mother   . Diabetes Father   . Heart attack Father   . Hyperlipidemia Father    Social History   Socioeconomic History  . Marital status: Married    Spouse name: Not on file  . Number of children: 2  . Years of education: 9th grade  . Highest education level: Not on file  Occupational History  . Occupation: employed  Tobacco Use  . Smoking status:  Never Smoker  . Smokeless tobacco: Never Used  Vaping Use  . Vaping Use: Never used  Substance and Sexual Activity  . Alcohol use: Yes    Alcohol/week: 0.0 standard drinks    Comment: on rare occasion  . Drug use: No  . Sexual activity: Not Currently  Other Topics Concern  . Not on file  Social History Narrative  . Not on file   Social Determinants of Health   Financial Resource Strain: Low Risk   . Difficulty of Paying Living Expenses: Not hard at all  Food Insecurity: No Food Insecurity  . Worried About Charity fundraiser in the Last Year: Never true  . Ran Out of Food in the Last Year: Never true  Transportation Needs: No Transportation Needs  . Lack of Transportation (Medical): No  . Lack of Transportation (Non-Medical): No  Physical Activity: Inactive  . Days of Exercise per Week: 0 days  . Minutes of Exercise per Session: 0 min  Stress: No Stress Concern Present  . Feeling of Stress : Not at all  Social Connections: Not on file    Tobacco Counseling Counseling given: Not Answered   Clinical Intake:  Pre-visit preparation completed: Yes  Pain : No/denies pain     Nutritional Status: BMI 25 -29 Overweight Nutritional Risks: None Diabetes: No  How often do you need to have someone help you when you read instructions, pamphlets, or other written materials from your doctor or pharmacy?: 1 - Never What is the last grade level you completed in school?: 9th grade  Diabetic? no  Interpreter Needed?: No  Information entered by :: NAllen LPN   Activities of Daily Living In your present state of health, do you have any difficulty performing the following activities: 12/04/2020 02/17/2020  Hearing? N N  Vision? N N  Difficulty concentrating or making decisions? N N  Walking or climbing stairs? N N  Dressing or bathing? N N  Doing errands, shopping? N N  Preparing Food and eating ? N -  Using the Toilet? N -  In the past six months, have you accidently  leaked urine? N -  Do you have problems with loss of bowel control? N -  Managing your Medications? N -  Managing your Finances? N -  Housekeeping or managing your Housekeeping? N -  Some recent data might be hidden    Patient Care Team: Valerie Roys, DO as PCP - General (Family Medicine) Dionisio David, MD as Consulting Physician (Cardiology)  Indicate any recent Medical Services you may have received from other than [facility] providers in the past year (date may be approximate).     Assessment:   This is a routine wellness examination for Nathan Fields.  Hearing/Vision screen  Hearing Screening   125Hz  250Hz  500Hz  1000Hz  2000Hz  3000Hz  4000Hz  6000Hz  8000Hz   Right ear:           Left ear:           Vision Screening Comments: Regular eye exams,  [facility]  Dietary issues and exercise activities discussed: Current Exercise Habits: The patient has a physically strenuous job, but has no regular exercise apart from work.  Goals Addressed            This Visit's Progress   . Patient Stated       12/04/2020, wants to lose a little weight      Depression Screen PHQ 2/9 Scores 12/04/2020 11/28/2020 02/17/2020 11/24/2019 08/02/2019 01/12/2019 07/14/2018  PHQ - 2 Score 0 0 4 0 0 0 0  PHQ- 9 Score - 0 5 - 2 0 3    Fall Risk Fall Risk  12/04/2020 02/17/2020 11/24/2019 08/02/2019 07/14/2018  Falls in the past year? 0 1 0 0 0  Number falls in past yr: - 0 0 0 -  Injury with Fall? - 0 0 0 -  Risk for fall due to : Medication side effect - - - -  Follow up Falls evaluation completed;Education provided;Falls prevention discussed - - - Falls evaluation completed    FALL RISK PREVENTION PERTAINING TO THE HOME:  Any stairs in or around the home? Yes  If so, are there any without handrails? No  Home free of loose throw rugs in walkways, pet beds, electrical cords, etc? Yes  Adequate lighting in your home to reduce risk of falls? Yes   ASSISTIVE DEVICES UTILIZED TO PREVENT FALLS:  Life alert? No  Use of  a cane, walker or w/c? No  Grab bars in the bathroom? No  Shower chair or bench in shower? No  Elevated toilet seat or a handicapped toilet? No   TIMED UP AND GO:  Was the test performed? No   Cognitive Function:     6CIT Screen 12/04/2020 02/23/2018 10/08/2016  What Year? 0 points 0 points 0 points  What month? 0 points 3 points 3 points  What time? 0 points 3 points 0 points  Count back from 20 0 points 0 points 0 points  Months in reverse 4 points 0 points 0 points  Repeat phrase 6 points 4 points 4 points  Total Score 10 10 7     Immunizations Immunization History  Administered Date(s) Administered  . Influenza, High Dose Seasonal PF 05/22/2018    TDAP status: Due, Education has been provided regarding the importance of this vaccine. Advised may receive this vaccine at local pharmacy or Health Dept. Aware to provide a copy of the vaccination record if obtained from local pharmacy or Health Dept. Verbalized acceptance and understanding.  Flu Vaccine status: Declined, Education has been provided regarding the importance of this vaccine but patient still declined. Advised may receive this vaccine at local pharmacy or Health Dept. Aware to provide a copy of the vaccination record if obtained from local pharmacy or Health Dept. Verbalized acceptance and understanding.  Pneumococcal vaccine status: Declined,  Education has been provided regarding the importance of this vaccine but patient still declined. Advised may receive this vaccine at local pharmacy or Health Dept. Aware to provide a copy of the vaccination record if obtained from local pharmacy or Health Dept. Verbalized acceptance and understanding.   Covid-19 vaccine status: Declined, Education has been provided regarding the importance of this vaccine but patient still declined. Advised may receive this vaccine at local pharmacy or Health Dept.or vaccine clinic. Aware to provide a copy of the vaccination record if obtained from  local pharmacy or Health Dept. Verbalized acceptance and understanding.  Qualifies for Shingles Vaccine? Yes   Zostavax completed No  Shingrix Completed?: No.    Education has been provided regarding the importance of this vaccine. Patient has been advised to call insurance company to determine out of pocket expense if they have not yet received this vaccine. Advised may also receive vaccine at local pharmacy or Health Dept. Verbalized acceptance and understanding.  Screening Tests Health Maintenance  Topic Date Due  . COVID-19 Vaccine (1) 12/20/2020 (Originally 10/30/1955)  . Zoster Vaccines- Shingrix (1 of 2) 03/06/2021 (Originally 10/30/2001)  . Pneumococcal Vaccine 65-17 Years old (1 of 2 - PPSV23) 12/04/2021 (Originally 10/30/1956)  . TETANUS/TDAP  12/04/2021 (Originally [birth year]1970)  . PNA vac Low Risk Adult (1 of 2 - PCV13) 12/04/2021 (Originally 10/30/2016)  . INFLUENZA VACCINE  01/29/2021  . COLONOSCOPY (Pts 45-67yrs Insurance coverage will need to be confirmed)  01/14/2022  . Hepatitis C Screening  Completed  . HPV VACCINES  Aged Out    Health Maintenance  There are no preventive care reminders to display for this patient.  Colorectal cancer screening: Type of screening: Colonoscopy. Completed 01/14/2017. Repeat every 5 years  Lung Cancer Screening: (Low Dose CT Chest recommended if Age 18-80 years, 30 pack-year currently smoking OR have quit w/in 15years.) does not qualify.   Lung Cancer Screening Referral: no  Additional Screening:  Hepatitis C Screening: does qualify; Completed 08/16/2014  Vision Screening: Recommended annual ophthalmology exams for early detection of glaucoma and other disorders of the eye. Is the patient up to date with their annual eye exam?  No  Who is the provider or what is the name of the office in which the patient attends annual eye exams? [facility] If pt is not established with a provider, would they like to be referred to a provider to establish care? No  .   Dental Screening: Recommended annual dental exams for proper oral hygiene  Community Resource Referral / Chronic Care Management: CRR required this visit?  No   CCM required this visit?  No      Plan:     I have personally reviewed and noted the following in the patient's chart:   . Medical and social history . Use of alcohol, tobacco or illicit drugs  . Current medications and supplements including opioid prescriptions. Patient is not currently taking opioid prescriptions. . Functional ability and status . Nutritional status . Physical activity . Advanced directives . List of other physicians . Hospitalizations, surgeries, and ER visits in previous 12 months . Vitals . Screenings to include cognitive, depression, and falls . Referrals and appointments  In addition, I have reviewed and discussed with patient certain preventive protocols, quality metrics, and best practice recommendations. A written personalized care plan for preventive services as well as general preventive health recommendations were provided to patient.     Kellie Simmering, LPN   12/08/7946   Nurse Notes:

## 2020-12-21 ENCOUNTER — Other Ambulatory Visit: Payer: Self-pay

## 2020-12-21 ENCOUNTER — Ambulatory Visit: Payer: Medicare Other | Admitting: Dermatology

## 2020-12-21 DIAGNOSIS — D492 Neoplasm of unspecified behavior of bone, soft tissue, and skin: Secondary | ICD-10-CM

## 2020-12-21 DIAGNOSIS — D2339 Other benign neoplasm of skin of other parts of face: Secondary | ICD-10-CM

## 2020-12-21 DIAGNOSIS — L820 Inflamed seborrheic keratosis: Secondary | ICD-10-CM | POA: Diagnosis not present

## 2020-12-21 DIAGNOSIS — L821 Other seborrheic keratosis: Secondary | ICD-10-CM | POA: Diagnosis not present

## 2020-12-21 NOTE — Patient Instructions (Addendum)
If you have any questions or concerns for your doctor, please call our main line at [phone] and press option 4 to reach your doctor's medical assistant. If no one answers, please leave a voicemail as directed and we will return your call as soon as possible. Messages left after 4 pm will be answered the following business day.   You may also send us a message via MyChart. We typically respond to MyChart messages within 1-2 business days.  For prescription refills, please ask your pharmacy to contact our office. Our fax number is [phone].  If you have an urgent issue when the clinic is closed that cannot wait until the next business day, you can page your doctor at the number below.    Please note that while we do our best to be available for urgent issues outside of office hours, we are not available 24/7.   If you have an urgent issue and are unable to reach us, you may choose to seek medical care at your doctor's office, retail clinic, urgent care center, or emergency room.  If you have a medical emergency, please immediately call 911 or go to the emergency department.  Pager Numbers  - Dr. Kowalski: [phone]  - Dr. Moye: [phone]  - Dr. Stewart: [phone]  In the event of inclement weather, please call our main line at [phone] for an update on the status of any delays or closures.  Dermatology Medication Tips: Please keep the boxes that topical medications come in in order to help keep track of the instructions about where and how to use these. Pharmacies typically print the medication instructions only on the boxes and not directly on the medication tubes.   If your medication is too expensive, please contact our office at [phone] option 4 or send us a message through MyChart.   We are unable to tell what your co-pay for medications will be in advance as this is different depending on your insurance coverage. However, we may be able to find a substitute  medication at lower cost or fill out paperwork to get insurance to cover a needed medication.   If a prior authorization is required to get your medication covered by your insurance company, please allow us 1-2 business days to complete this process.  Drug prices often vary depending on where the prescription is filled and some pharmacies may offer cheaper prices.  The website www.goodrx.com contains coupons for medications through different pharmacies. The prices here do not account for what the cost may be with help from insurance (it may be cheaper with your insurance), but the website can give you the price if you did not use any insurance.  - You can print the associated coupon and take it with your prescription to the pharmacy.  - You may also stop by our office during regular business hours and pick up a GoodRx coupon card.  - If you need your prescription sent electronically to a different pharmacy, notify our office through [facility] MyChart or by phone at [phone] option 4.   Wound Care Instructions  Cleanse wound gently with soap and water once a day then pat dry with clean gauze. Apply a thing coat of Petrolatum (petroleum jelly, "Vaseline") over the wound (unless you have an allergy to this). We recommend that you use a new, sterile tube of Vaseline. Do not pick or remove scabs. Do not remove the yellow or white "healing tissue" from the base of the wound.  Cover the   wound with fresh, clean, nonstick gauze and secure with paper tape. You may use Band-Aids in place of gauze and tape if the would is small enough, but would recommend trimming much of the tape off as there is often too much. Sometimes Band-Aids can irritate the skin.  You should call the office for your biopsy report after 1 week if you have not already been contacted.  If you experience any problems, such as abnormal amounts of bleeding, swelling, significant bruising, significant pain, or evidence of infection,  please call the office immediately.  FOR ADULT SURGERY PATIENTS: If you need something for pain relief you may take 1 extra strength Tylenol (acetaminophen) AND 2 Ibuprofen (200mg each) together every 4 hours as needed for pain. (do not take these if you are allergic to them or if you have a reason you should not take them.) Typically, you may only need pain medication for 1 to 3 days.    

## 2020-12-21 NOTE — Progress Notes (Signed)
   New Patient Visit  Subjective  Nathan Fields is a 70 y.o. male who presents for the following: check spots (Face, pt interested in having removed.).  The following portions of the chart were reviewed this encounter and updated as appropriate:   Tobacco  Allergies  Meds  Problems  Med Hx  Surg Hx  Fam Hx      Review of Systems:  No other skin or systemic complaints except as noted in HPI or Assessment and Plan.  Objective  Well appearing patient in no apparent distress; mood and affect are within normal limits.  A focused examination was performed including face. Relevant physical exam findings are noted in the Assessment and Plan.  R nose Flesh pap 0.5cm  face x 20, Total = 20 (20) Erythematous keratotic or waxy stuck-on papule or plaque.    Assessment & Plan   Seborrheic Keratoses - Stuck-on, waxy, tan-brown papules and/or plaques  - Benign-appearing - Discussed benign etiology and prognosis. - Observe - Call for any changes  Neoplasm of skin R nose  Epidermal / dermal shaving  Lesion diameter (cm):  0.5 Informed consent: discussed and consent obtained   Timeout: patient name, date of birth, surgical site, and procedure verified   Procedure prep:  Patient was prepped and draped in usual sterile fashion Prep type:  Isopropyl alcohol Anesthesia: the lesion was anesthetized in a standard fashion   Anesthetic:  1% lidocaine w/ epinephrine 1-100,000 buffered w/ 8.4% NaHCO3 Instrument used: flexible razor blade   Hemostasis achieved with: pressure, aluminum chloride and electrodesiccation   Outcome: patient tolerated procedure well   Post-procedure details: sterile dressing applied and wound care instructions given   Dressing type: bandage and petrolatum    Specimen 1 - Surgical pathology Differential Diagnosis: D48.5 Fibrous pap vs other  Check Margins: yes Flesh pap 0.5cm  Inflamed seborrheic keratosis face x 20, Total = 20  Destruction of  lesion - face x 20, Total = 20 Complexity: simple   Destruction method: cryotherapy   Informed consent: discussed and consent obtained   Timeout:  patient name, date of birth, surgical site, and procedure verified Lesion destroyed using liquid nitrogen: Yes   Region frozen until ice ball extended beyond lesion: Yes   Outcome: patient tolerated procedure well with no complications   Post-procedure details: wound care instructions given    Return for PRN pending bx.  I, Othelia Pulling, RMA, am acting as scribe for Sarina Ser, MD . Documentation: I have reviewed the above documentation for accuracy and completeness, and I agree with the above.  Sarina Ser, MD

## 2021-01-04 ENCOUNTER — Encounter: Payer: Self-pay | Admitting: Dermatology

## 2021-01-04 ENCOUNTER — Telehealth: Payer: Self-pay

## 2021-01-04 NOTE — Telephone Encounter (Signed)
Advised patient of results and sent a message to Dr Park Liter.

## 2021-01-04 NOTE — Telephone Encounter (Signed)
-----   Message from Ralene Bathe, MD sent at 01/03/2021  9:39 PM EDT ----- Diagnosis Skin , right nose SEBACEOUS ADENOMA, BASE INVOLVED, SEE DESCRIPTION  Benign sebaceous adenoma May recur - no further treatment needed at this time. Should be screened for Muir-Torre Syndrome by his PCP Pt does not have a follow up with me, but if pt found to have Muir-Torre Syndrome, should have yearly skin cancer screening visits.

## 2021-01-10 ENCOUNTER — Encounter: Payer: Self-pay | Admitting: Family Medicine

## 2021-01-12 ENCOUNTER — Ambulatory Visit: Payer: Medicare HMO | Admitting: Cardiology

## 2021-02-20 ENCOUNTER — Encounter: Payer: Medicare HMO | Admitting: Family Medicine

## 2021-02-28 ENCOUNTER — Ambulatory Visit
Admission: EM | Admit: 2021-02-28 | Discharge: 2021-02-28 | Disposition: A | Discharge status: Home / Self Care | Payer: Medicare HMO | Attending: Sports Medicine | Admitting: Sports Medicine

## 2021-02-28 ENCOUNTER — Other Ambulatory Visit: Payer: Self-pay

## 2021-02-28 ENCOUNTER — Encounter: Payer: Self-pay | Admitting: Emergency Medicine

## 2021-02-28 ENCOUNTER — Ambulatory Visit (INDEPENDENT_AMBULATORY_CARE_PROVIDER_SITE_OTHER): Payer: Medicare HMO

## 2021-02-28 DIAGNOSIS — M545 Low back pain, unspecified: Secondary | ICD-10-CM | POA: Diagnosis not present

## 2021-02-28 DIAGNOSIS — S161XXA Strain of muscle, fascia and tendon at neck level, initial encounter: Secondary | ICD-10-CM

## 2021-02-28 DIAGNOSIS — S39012A Strain of muscle, fascia and tendon of lower back, initial encounter: Secondary | ICD-10-CM | POA: Diagnosis not present

## 2021-02-28 DIAGNOSIS — R109 Unspecified abdominal pain: Secondary | ICD-10-CM | POA: Diagnosis not present

## 2021-02-28 LAB — URINALYSIS, COMPLETE (UACMP) WITH MICROSCOPIC
Bilirubin Urine: NEGATIVE
Glucose, UA: NEGATIVE mg/dL
Hgb urine dipstick: NEGATIVE
Ketones, ur: NEGATIVE mg/dL
Leukocytes,Ua: NEGATIVE
Nitrite: NEGATIVE
Protein, ur: NEGATIVE mg/dL
Specific Gravity, Urine: 1.02 (ref 1.005–1.030)
pH: 7 (ref 5.0–8.0)

## 2021-02-28 MED ORDER — IBUPROFEN 600 MG PO TABS: 600.0000 mg | ORAL_TABLET | Freq: Four times a day (QID) | ORAL | 0 refills | Status: DC | PRN

## 2021-02-28 MED ORDER — BACLOFEN 10 MG PO TABS: 10.0000 mg | ORAL_TABLET | Freq: Three times a day (TID) | ORAL | 0 refills | Status: DC
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
## 2021-02-28 NOTE — Discharge Instructions (Addendum)
Take the ibuprofen, 600 mg every 6 hours with food, on a schedule for the next 48 hours and then as needed.  Take the baclofen, 10 mg every 8 hours, on a schedule for the next 48 hours and then as needed.  Apply moist heat to your back for 30 minutes at a time 2-3 times a day to improve blood flow to the area and help remove the lactic acid causing the spasm.  Follow the neck/back exercises given at discharge.  Return for reevaluation for any new or worsening symptoms.
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
## 2021-03-06 ENCOUNTER — Other Ambulatory Visit: Payer: Self-pay

## 2021-03-06 ENCOUNTER — Ambulatory Visit
Admission: RE | Admit: 2021-03-06 | Discharge: 2021-03-06 | Disposition: A | Discharge status: Home / Self Care | Payer: Medicare HMO | Source: Ambulatory Visit | Attending: Family Medicine | Admitting: Family Medicine

## 2021-03-06 ENCOUNTER — Ambulatory Visit (INDEPENDENT_AMBULATORY_CARE_PROVIDER_SITE_OTHER): Payer: Medicare HMO | Admitting: Family Medicine

## 2021-03-06 ENCOUNTER — Ambulatory Visit
Admission: RE | Admit: 2021-03-06 | Discharge: 2021-03-06 | Disposition: A | Discharge status: Home / Self Care | Payer: Medicare HMO | Attending: Family Medicine | Admitting: Family Medicine

## 2021-03-06 ENCOUNTER — Encounter: Payer: Self-pay | Admitting: Family Medicine

## 2021-03-06 VITALS — BP 166/82 | HR 64 | Temp 97.7°F | Ht 65.98 in | Wt 164.8 lb

## 2021-03-06 DIAGNOSIS — M542 Cervicalgia: Secondary | ICD-10-CM | POA: Insufficient documentation

## 2021-03-06 DIAGNOSIS — S134XXA Sprain of ligaments of cervical spine, initial encounter: Secondary | ICD-10-CM | POA: Diagnosis not present

## 2021-03-06 DIAGNOSIS — M5442 Lumbago with sciatica, left side: Secondary | ICD-10-CM

## 2021-03-06 DIAGNOSIS — M5441 Lumbago with sciatica, right side: Secondary | ICD-10-CM | POA: Diagnosis not present

## 2021-03-06 MED ORDER — KETOROLAC TROMETHAMINE 60 MG/2ML IM SOLN
60.0000 mg | Freq: Once | INTRAMUSCULAR | Status: AC
  Administered 2021-03-06: 60 mg via INTRAMUSCULAR

## 2021-03-06 MED ORDER — NAPROXEN 500 MG PO TABS: 500.0000 mg | ORAL_TABLET | Freq: Two times a day (BID) | ORAL | 0 refills | Status: DC
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
## 2021-03-09 ENCOUNTER — Other Ambulatory Visit: Payer: Self-pay | Admitting: Family Medicine

## 2021-03-09 DIAGNOSIS — R9389 Abnormal findings on diagnostic imaging of other specified body structures: Secondary | ICD-10-CM

## 2021-03-09 DIAGNOSIS — I10 Essential (primary) hypertension: Secondary | ICD-10-CM

## 2021-03-11 ENCOUNTER — Encounter: Payer: Self-pay | Admitting: Family Medicine

## 2021-03-21 ENCOUNTER — Ambulatory Visit
Admission: RE | Admit: 2021-03-21 | Discharge: 2021-03-21 | Disposition: A | Discharge status: Home / Self Care | Payer: Medicare HMO | Source: Ambulatory Visit | Attending: Family Medicine | Admitting: Family Medicine

## 2021-03-21 DIAGNOSIS — I6523 Occlusion and stenosis of bilateral carotid arteries: Secondary | ICD-10-CM

## 2021-03-21 DIAGNOSIS — R9389 Abnormal findings on diagnostic imaging of other specified body structures: Secondary | ICD-10-CM | POA: Diagnosis not present

## 2021-03-21 DIAGNOSIS — I10 Essential (primary) hypertension: Secondary | ICD-10-CM | POA: Diagnosis not present

## 2021-03-21 HISTORY — DX: Occlusion and stenosis of bilateral carotid arteries: I65.23

## 2021-03-22 ENCOUNTER — Other Ambulatory Visit: Payer: Self-pay | Admitting: Family Medicine

## 2021-03-22 DIAGNOSIS — I6523 Occlusion and stenosis of bilateral carotid arteries: Secondary | ICD-10-CM

## 2021-03-27 ENCOUNTER — Encounter: Payer: Self-pay | Admitting: Family Medicine

## 2021-04-02 ENCOUNTER — Other Ambulatory Visit: Payer: Self-pay | Admitting: Family Medicine

## 2021-04-12 ENCOUNTER — Other Ambulatory Visit (INDEPENDENT_AMBULATORY_CARE_PROVIDER_SITE_OTHER): Payer: Self-pay | Admitting: Nurse Practitioner

## 2021-04-12 DIAGNOSIS — I6523 Occlusion and stenosis of bilateral carotid arteries: Secondary | ICD-10-CM

## 2021-04-13 ENCOUNTER — Encounter (INDEPENDENT_AMBULATORY_CARE_PROVIDER_SITE_OTHER): Payer: Self-pay

## 2021-04-13 ENCOUNTER — Other Ambulatory Visit: Payer: Self-pay

## 2021-04-13 ENCOUNTER — Ambulatory Visit (INDEPENDENT_AMBULATORY_CARE_PROVIDER_SITE_OTHER): Payer: Medicare HMO | Admitting: Nurse Practitioner

## 2021-04-13 VITALS — BP 128/62 | HR 60 | Resp 16 | Ht 66.0 in | Wt 166.2 lb

## 2021-04-13 DIAGNOSIS — I6523 Occlusion and stenosis of bilateral carotid arteries: Secondary | ICD-10-CM | POA: Diagnosis not present

## 2021-04-13 DIAGNOSIS — E782 Mixed hyperlipidemia: Secondary | ICD-10-CM

## 2021-04-13 DIAGNOSIS — M542 Cervicalgia: Secondary | ICD-10-CM | POA: Diagnosis not present

## 2021-04-13 DIAGNOSIS — I10 Essential (primary) hypertension: Secondary | ICD-10-CM | POA: Diagnosis not present

## 2021-04-14 ENCOUNTER — Encounter (INDEPENDENT_AMBULATORY_CARE_PROVIDER_SITE_OTHER): Payer: Self-pay | Admitting: Nurse Practitioner

## 2021-04-14 NOTE — Progress Notes (Signed)
Subjective:    Patient ID: Nathan Fields, male    DOB: July 24, 1950, 70 y.o.   MRN: [id number] Chief Complaint  Patient presents with   New Patient (Initial Visit)    Ref Wynetta Emery bilateral carotid stenosis    Lavell Supple is a 70 year old male that presents today for evaluation of carotid artery stenosis.  The patient had an x-ray done due to pain in his neck and found evidence of atherosclerotic sclerotic disease.  The patient subsequently had a carotid duplex performed which showed no a 50 to 69% stenosis of the right ICA with a 70 to 99% stenosis of the left ICA.  The patient has noted some worsening blurry vision as of late.  He denies any TIA or strokelike symptoms.     Review of Systems  Eyes:  Positive for visual disturbance.  Musculoskeletal:  Positive for neck pain and neck stiffness.  All other systems reviewed and are negative.     Objective:   Physical Exam Vitals reviewed.  HENT:     Head: Normocephalic.  Neck:     Vascular: Carotid bruit present.  Cardiovascular:     Rate and Rhythm: Normal rate and regular rhythm.     Pulses: Normal pulses.     Heart sounds: Normal heart sounds.  Pulmonary:     Effort: Pulmonary effort is normal.  Skin:    General: Skin is warm and dry.  Neurological:     Mental Status: He is alert and oriented to person, place, and time.  Psychiatric:        Mood and Affect: Mood normal.        Behavior: Behavior normal.        Thought Content: Thought content normal.        Judgment: Judgment normal.    BP 128/62 (BP Location: Right Arm)   Pulse 60   Resp 16   Ht 5\' 6"  (1.676 m)   Wt 166 lb 3.2 oz (75.4 kg)   BMI 26.83 kg/m   Past Medical History:  Diagnosis Date   Anxiety    Arthritis    right foot   CAD (coronary artery disease)    Chronic low back pain    Diabetes mellitus without complication (HCC)    Diabetes mellitus without complication (HCC)    GERD (gastroesophageal reflux disease)    Hyperlipidemia     Hypertension    Impaired fasting glucose    Neuromuscular disorder ([place])    Plantar fasciitis    Syncope     Social History   Socioeconomic History   Marital status: Married    Spouse name: Not on file   Number of children: 2   Years of education: 9th grade   Highest education level: Not on file  Occupational History   Occupation: employed  Tobacco Use   Smoking status: Never   Smokeless tobacco: Never  Vaping Use   Vaping Use: Never used  Substance and Sexual Activity   Alcohol use: Yes    Alcohol/week: 0.0 standard drinks    Comment: on rare occasion   Drug use: No   Sexual activity: Not Currently  Other Topics Concern   Not on file  Social History Narrative   Not on file   Social Determinants of Health   Financial Resource Strain: Low Risk    Difficulty of Paying Living Expenses: Not hard at all  Food Insecurity: No Food Insecurity   Worried About [place] in the Last  Year: Never true   [place] in the Last Year: Never true  Transportation Needs: No Transportation Needs   Lack of Transportation (Medical): No   Lack of Transportation (Non-Medical): No  Physical Activity: Inactive   Days of Exercise per Week: 0 days   Minutes of Exercise per Session: 0 min  Stress: No Stress Concern Present   Feeling of Stress : Not at all  Social Connections: Not on file  Intimate Partner Violence: Not on file    Past Surgical History:  Procedure Laterality Date   CARDIAC CATHETERIZATION     COLONOSCOPY  2011   Polyp removed, follow up in 5 years   COLONOSCOPY WITH PROPOFOL N/A 01/14/2017   Procedure: COLONOSCOPY WITH PROPOFOL;  Surgeon: [name], MD;  Location: [facility] ENDOSCOPY;  Service: Endoscopy;  Laterality: N/A;    Family History  Problem Relation Age of Onset   Heart disease Mother    Hyperlipidemia Mother    Diabetes Mother    Congestive Heart Failure Mother    Hypertension Mother    Diabetes Father    Heart attack Father     Hyperlipidemia Father     Allergies  Allergen Reactions   Statins Other (See Comments)    fatigue fatigue   Zetia [Ezetimibe] Other (See Comments)    Constipation   Niacin Rash   Niacin And Related Rash    CBC Latest Ref Rng & Units 11/28/2020 02/17/2020 08/10/2019  WBC 3.4 - 10.8 x10E3/uL 6.8 6.4 6.2  Hemoglobin 13.0 - 17.7 g/dL 13.8 14.7 14.9  Hematocrit 37.5 - 51.0 % 40.8 43.7 43.9  Platelets 150 - 450 x10E3/uL 223 175 238      CMP     Component Value Date/Time   NA 142 11/28/2020 1622   NA 136 07/19/2012 0615   K 4.3 11/28/2020 1622   K 3.6 07/19/2012 0615   CL 106 11/28/2020 1622   CL 103 07/19/2012 0615   CO2 22 11/28/2020 1622   CO2 26 07/19/2012 0615   GLUCOSE 101 (H) 11/28/2020 1622   GLUCOSE 162 (H) 08/22/2017 1257   GLUCOSE 115 (H) 07/19/2012 0615   BUN 17 11/28/2020 1622   BUN 17 07/19/2012 0615   CREATININE 1.00 11/28/2020 1622   CREATININE 1.12 07/19/2012 0615   CALCIUM 9.7 11/28/2020 1622   CALCIUM 7.7 (L) 07/19/2012 0615   PROT 6.6 11/28/2020 1622   PROT 5.7 (L) 07/19/2012 0615   ALBUMIN 4.6 11/28/2020 1622   ALBUMIN 3.0 (L) 07/19/2012 0615   AST 21 11/28/2020 1622   AST 32 09/23/2016 1556   AST 46 (H) 07/19/2012 0615   ALT 19 11/28/2020 1622   ALT 19 09/23/2016 1556   ALT 30 07/19/2012 0615   ALKPHOS 99 11/28/2020 1622   ALKPHOS 80 07/19/2012 0615   BILITOT 0.3 11/28/2020 1622   BILITOT 0.3 07/19/2012 0615   GFRNONAA 69 02/17/2020 1439   GFRNONAA >60 07/19/2012 0615   GFRAA 79 02/17/2020 1439   GFRAA >60 07/19/2012 0615     No results found.     Assessment & Plan:   1. Bilateral carotid artery stenosis The patient remains asymptomatic with respect to the carotid stenosis.  However, the patient has now progressed and has a lesion the is >70%.  Patient should undergo CT angiography of the carotid arteries to define the degree of stenosis of the internal carotid arteries bilaterally and the anatomic suitability for surgery vs.  intervention.  If the patient does indeed need surgery  cardiac clearance will be required, once cleared the patient will be scheduled for surgery.  The risks, benefits and alternative therapies were reviewed in detail with the patient.  All questions were answered.  The patient agrees to proceed with imaging.  Continue antiplatelet therapy as prescribed. Continue management of CAD, HTN and Hyperlipidemia. Healthy heart diet, encouraged exercise at least 4 times per week.   - CT ANGIO NECK W OR WO CONTRAST; Future  2. Mixed hyperlipidemia Continue statin as ordered and reviewed, no changes at this time   3. Primary hypertension Continue antihypertensive medications as already ordered, these medications have been reviewed and there are no changes at this time.   4. Neck pain Had a long discussion about the pathophysiology of carotid stenosis as well as the associated symptoms.  Neck pain is not a symptom of carotid stenosis.  His pain is likely related to musculoskeletal issues.  We discussed natural signs symptoms of worsening carotid stenosis such as TIA, amaurosis fugax or stroke.  Patient will follow-up with PCP for further evaluation and treatment of back pain.   Current Outpatient Medications on File Prior to Visit  Medication Sig Dispense Refill   amLODipine (NORVASC) 2.5 MG tablet Take 1 tablet (2.5 mg total) by mouth daily. 90 tablet 1   aspirin 81 MG tablet Take 81 mg by mouth daily.     baclofen (LIORESAL) 10 MG tablet Take 1 tablet (10 mg total) by mouth 3 (three) times daily. 30 each 0   rosuvastatin (CRESTOR) 5 MG tablet Take 1 tablet (5 mg total) by mouth every other day. 45 tablet 1   valsartan (DIOVAN) 40 MG tablet Take 1 tablet (40 mg total) by mouth daily. 90 tablet 1   naproxen (NAPROSYN) 500 MG tablet TAKE 1 TABLET BY MOUTH 2 TIMES DAILY WITH A MEAL. 60 tablet 2   No current facility-administered medications on file prior to visit.    There are no Patient  Instructions on file for this visit. No follow-ups on file.   Kris Hartmann, NP

## 2021-04-17 ENCOUNTER — Ambulatory Visit (INDEPENDENT_AMBULATORY_CARE_PROVIDER_SITE_OTHER): Payer: Medicare HMO | Admitting: Family Medicine

## 2021-04-17 ENCOUNTER — Encounter: Payer: Self-pay | Admitting: Family Medicine

## 2021-04-17 ENCOUNTER — Other Ambulatory Visit: Payer: Self-pay

## 2021-04-17 VITALS — BP 129/72 | HR 62 | Ht 66.0 in | Wt 166.0 lb

## 2021-04-17 DIAGNOSIS — M542 Cervicalgia: Secondary | ICD-10-CM | POA: Diagnosis not present

## 2021-04-17 DIAGNOSIS — I10 Essential (primary) hypertension: Secondary | ICD-10-CM

## 2021-04-17 DIAGNOSIS — M5441 Lumbago with sciatica, right side: Secondary | ICD-10-CM

## 2021-04-17 DIAGNOSIS — G8929 Other chronic pain: Secondary | ICD-10-CM

## 2021-04-17 DIAGNOSIS — R7301 Impaired fasting glucose: Secondary | ICD-10-CM

## 2021-04-17 DIAGNOSIS — E782 Mixed hyperlipidemia: Secondary | ICD-10-CM

## 2021-04-17 DIAGNOSIS — R3911 Hesitancy of micturition: Secondary | ICD-10-CM

## 2021-04-17 DIAGNOSIS — M5442 Lumbago with sciatica, left side: Secondary | ICD-10-CM

## 2021-04-17 LAB — MICROALBUMIN, URINE WAIVED
Creatinine, Urine Waived: 200 mg/dL (ref 10–300)
Microalb, Ur Waived: 30 mg/L — ABNORMAL HIGH (ref 0–19)
Microalb/Creat Ratio: <30 mg/g (ref <30)

## 2021-04-17 LAB — BAYER DCA HB A1C WAIVED: HB A1C (BAYER DCA - WAIVED): 6.1 % — ABNORMAL HIGH (ref 4.8–5.6)

## 2021-04-17 NOTE — Assessment & Plan Note (Signed)
Under good control on current regimen. Continue current regimen. Continue to monitor. Call with any concerns. Refills given. Labs drawn today.   

## 2021-04-17 NOTE — Assessment & Plan Note (Signed)
Labs drawn today. Await results.  
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
## 2021-04-17 NOTE — Assessment & Plan Note (Signed)
Would like to see pain management. Declines PT at this time. Continue to monitor. Call with any concerns.

## 2021-04-18 LAB — COMPREHENSIVE METABOLIC PANEL
ALT: 20 IU/L (ref 0–44)
AST: 19 IU/L (ref 0–40)
Albumin/Globulin Ratio: 2.2 (ref 1.2–2.2)
Albumin: 5 g/dL — ABNORMAL HIGH (ref 3.8–4.8)
Alkaline Phosphatase: 106 IU/L (ref 44–121)
BUN/Creatinine Ratio: 15 (ref 10–24)
BUN: 16 mg/dL (ref 8–27)
Bilirubin Total: 0.2 mg/dL (ref 0.0–1.2)
CO2: 26 mmol/L (ref 20–29)
Calcium: 10.2 mg/dL (ref 8.6–10.2)
Chloride: 101 mmol/L (ref 96–106)
Creatinine, Ser: 1.08 mg/dL (ref 0.76–1.27)
Globulin, Total: 2.3 g/dL (ref 1.5–4.5)
Glucose: 90 mg/dL (ref 70–99)
Potassium: 4.3 mmol/L (ref 3.5–5.2)
Sodium: 142 mmol/L (ref 134–144)
Total Protein: 7.3 g/dL (ref 6.0–8.5)
eGFR: 74 mL/min/{1.73_m2} (ref >59)

## 2021-04-18 LAB — CBC WITH DIFFERENTIAL/PLATELET
Basophils Absolute: 0.1 10*3/uL (ref 0.0–0.2)
Basos: 1 %
EOS (ABSOLUTE): 0.1 10*3/uL (ref 0.0–0.4)
Eos: 2 %
Hematocrit: 45.4 % (ref 37.5–51.0)
Hemoglobin: 15.2 g/dL (ref 13.0–17.7)
Immature Grans (Abs): 0 10*3/uL (ref 0.0–0.1)
Immature Granulocytes: 0 %
Lymphocytes Absolute: 2.3 10*3/uL (ref 0.7–3.1)
Lymphs: 34 %
MCH: 30 pg (ref 26.6–33.0)
MCHC: 33.5 g/dL (ref 31.5–35.7)
MCV: 90 fL (ref 79–97)
Monocytes Absolute: 0.5 10*3/uL (ref 0.1–0.9)
Monocytes: 8 %
Neutrophils Absolute: 3.8 10*3/uL (ref 1.4–7.0)
Neutrophils: 55 %
Platelets: 258 10*3/uL (ref 150–450)
RBC: 5.07 x10E6/uL (ref 4.14–5.80)
RDW: 12.5 % (ref 11.6–15.4)
WBC: 6.9 10*3/uL (ref 3.4–10.8)

## 2021-04-18 LAB — LIPID PANEL W/O CHOL/HDL RATIO
Cholesterol, Total: 243 mg/dL — ABNORMAL HIGH (ref 100–199)
HDL: 53 mg/dL (ref >39)
LDL Chol Calc (NIH): 157 mg/dL — ABNORMAL HIGH (ref 0–99)
Triglycerides: 182 mg/dL — ABNORMAL HIGH (ref 0–149)
VLDL Cholesterol Cal: 33 mg/dL (ref 5–40)

## 2021-04-18 LAB — PSA: Prostate Specific Ag, Serum: 1.5 ng/mL (ref 0.0–4.0)

## 2021-04-27 ENCOUNTER — Encounter (INDEPENDENT_AMBULATORY_CARE_PROVIDER_SITE_OTHER): Payer: Self-pay

## 2021-05-29 ENCOUNTER — Ambulatory Visit: Payer: Medicare HMO

## 2021-06-07 ENCOUNTER — Other Ambulatory Visit: Payer: Self-pay | Admitting: Family Medicine

## 2021-06-08 NOTE — Telephone Encounter (Signed)
Requested Prescriptions  Pending Prescriptions Disp Refills  . valsartan (DIOVAN) 40 MG tablet [Pharmacy Med Name: VALSARTAN 40 MG TABLET] 90 tablet 1    Sig: TAKE 1 TABLET BY MOUTH EVERY DAY     Cardiovascular:  Angiotensin Receptor Blockers Passed - 06/07/2021  5:37 PM      Passed - Cr in normal range and within 180 days    Creatinine  Date Value Ref Range Status  07/19/2012 1.12 0.60 - 1.30 mg/dL Final   Creatinine, Ser  Date Value Ref Range Status  04/17/2021 1.08 0.76 - 1.27 mg/dL Final         Passed - K in normal range and within 180 days    Potassium  Date Value Ref Range Status  04/17/2021 4.3 3.5 - 5.2 mmol/L Final  07/19/2012 3.6 3.5 - 5.1 mmol/L Final         Passed - Patient is not pregnant      Passed - Last BP in normal range    BP Readings from Last 1 Encounters:  04/17/21 129/72         Passed - Valid encounter within last 6 months    Recent Outpatient Visits          1 month ago Neck pain   [place], Megan P, DO   3 months ago Neck pain   [facility] [place], Megan P, DO   6 months ago Coronary artery disease involving native coronary artery of native heart without angina pectoris   [place], Megan P, DO   1 year ago Routine general medical examination at a health care facility   [place], [place], DO   1 year ago Essential hypertension   [facility] Valerie Roys, DO      Future Appointments            In 1 month Johnson, Barb Merino, DO [place], [place]   In 6 months  [employer], [place]

## 2021-06-15 ENCOUNTER — Other Ambulatory Visit: Payer: Self-pay

## 2021-06-15 ENCOUNTER — Ambulatory Visit
Admission: RE | Admit: 2021-06-15 | Discharge: 2021-06-15 | Disposition: A | Discharge status: Home / Self Care | Payer: Medicare HMO | Source: Ambulatory Visit | Attending: Nurse Practitioner | Admitting: Nurse Practitioner

## 2021-06-15 DIAGNOSIS — I6523 Occlusion and stenosis of bilateral carotid arteries: Secondary | ICD-10-CM | POA: Diagnosis not present

## 2021-06-15 DIAGNOSIS — E041 Nontoxic single thyroid nodule: Secondary | ICD-10-CM

## 2021-06-15 DIAGNOSIS — I6521 Occlusion and stenosis of right carotid artery: Secondary | ICD-10-CM | POA: Diagnosis not present

## 2021-06-15 HISTORY — DX: Nontoxic single thyroid nodule: E04.1

## 2021-06-15 LAB — POCT I-STAT CREATININE: Creatinine, Ser: 1.3 mg/dL — ABNORMAL HIGH (ref 0.61–1.24)

## 2021-06-15 MED ORDER — IOHEXOL 350 MG/ML SOLN
75.0000 mL | Freq: Once | INTRAVENOUS | Status: AC | PRN
  Administered 2021-06-15: 75 mL via INTRAVENOUS

## 2021-06-26 ENCOUNTER — Encounter (INDEPENDENT_AMBULATORY_CARE_PROVIDER_SITE_OTHER): Payer: Self-pay | Admitting: Nurse Practitioner

## 2021-06-26 ENCOUNTER — Ambulatory Visit (INDEPENDENT_AMBULATORY_CARE_PROVIDER_SITE_OTHER): Payer: Medicare HMO | Admitting: Nurse Practitioner

## 2021-06-26 ENCOUNTER — Other Ambulatory Visit: Payer: Self-pay

## 2021-06-26 VITALS — BP 161/75 | HR 64 | Resp 16 | Wt 167.0 lb

## 2021-06-26 DIAGNOSIS — I10 Essential (primary) hypertension: Secondary | ICD-10-CM | POA: Diagnosis not present

## 2021-06-26 DIAGNOSIS — I6523 Occlusion and stenosis of bilateral carotid arteries: Secondary | ICD-10-CM | POA: Diagnosis not present

## 2021-06-26 DIAGNOSIS — E782 Mixed hyperlipidemia: Secondary | ICD-10-CM | POA: Diagnosis not present

## 2021-07-02 ENCOUNTER — Encounter (INDEPENDENT_AMBULATORY_CARE_PROVIDER_SITE_OTHER): Payer: Self-pay | Admitting: Nurse Practitioner

## 2021-07-02 NOTE — Progress Notes (Signed)
Subjective:    Patient ID: Nathan Fields, male    DOB: December 01, 1950, 71 y.o.   MRN: [id number] Chief Complaint  Patient presents with   Follow-up    Ct results    The patient is seen for follow up evaluation of carotid stenosis status post CT angiogram. CT scan was done 06/15/2021. Patient reports that the test went well with no problems or complications.   The patient denies interval amaurosis fugax. There is no recent or interval TIA symptoms or focal motor deficits. There is no prior documented CVA.  The patient is taking enteric-coated aspirin 81 mg daily.  There is no history of migraine headaches. There is no history of seizures.  The patient has a history of coronary artery disease, no recent episodes of angina or shortness of breath. The patient denies PAD or claudication symptoms. There is a history of hyperlipidemia which is being treated with a statin.    CT angiogram is reviewed by me and Dr. Lucky Cowboy personally and shows greater than 80%-85% stenosis consistent with calcified plaque in the bilateral internal carotid arteries   Review of Systems  Eyes:  Negative for visual disturbance.  Musculoskeletal:  Positive for neck pain.  All other systems reviewed and are negative.     Objective:   Physical Exam Vitals reviewed.  HENT:     Head: Normocephalic.  Cardiovascular:     Rate and Rhythm: Normal rate.     Pulses: Normal pulses.  Pulmonary:     Effort: Pulmonary effort is normal.  Skin:    General: Skin is warm and dry.  Neurological:     Mental Status: He is alert and oriented to person, place, and time.  Psychiatric:        Mood and Affect: Mood is anxious.        Behavior: Behavior normal.        Thought Content: Thought content normal.        Judgment: Judgment normal.    BP (!) 161/75 (BP Location: Right Arm)    Pulse 64    Resp 16    Wt 167 lb (75.8 kg)    BMI 26.95 kg/m   Past Medical History:  Diagnosis Date   Anxiety    Arthritis    right  foot   CAD (coronary artery disease)    Chronic low back pain    Diabetes mellitus without complication (HCC)    Diabetes mellitus without complication (HCC)    GERD (gastroesophageal reflux disease)    Hyperlipidemia    Hypertension    Impaired fasting glucose    Neuromuscular disorder ([place])    Plantar fasciitis    Syncope     Social History   Socioeconomic History   Marital status: Married    Spouse name: Not on file   Number of children: 2   Years of education: 9th grade   Highest education level: Not on file  Occupational History   Occupation: employed  Tobacco Use   Smoking status: Never   Smokeless tobacco: Never  Vaping Use   Vaping Use: Never used  Substance and Sexual Activity   Alcohol use: Yes    Alcohol/week: 0.0 standard drinks    Comment: on rare occasion   Drug use: No   Sexual activity: Not Currently  Other Topics Concern   Not on file  Social History Narrative   Not on file   Social Determinants of Health   Financial Resource Strain: Low Risk  Difficulty of Paying Living Expenses: Not hard at all  Food Insecurity: No Food Insecurity   Worried About [place] in the Last Year: Never true   Ran Out of Food in the Last Year: Never true  Transportation Needs: No Transportation Needs   Lack of Transportation (Medical): No   Lack of Transportation (Non-Medical): No  Physical Activity: Inactive   Days of Exercise per Week: 0 days   Minutes of Exercise per Session: 0 min  Stress: No Stress Concern Present   Feeling of Stress : Not at all  Social Connections: Not on file  Intimate Partner Violence: Not on file    Past Surgical History:  Procedure Laterality Date   CARDIAC CATHETERIZATION     COLONOSCOPY  2011   Polyp removed, follow up in 5 years   COLONOSCOPY WITH PROPOFOL N/A 01/14/2017   Procedure: COLONOSCOPY WITH PROPOFOL;  Surgeon: [name], MD;  Location: [facility] ENDOSCOPY;  Service: Endoscopy;  Laterality: N/A;    Family  History  Problem Relation Age of Onset   Heart disease Mother    Hyperlipidemia Mother    Diabetes Mother    Congestive Heart Failure Mother    Hypertension Mother    Diabetes Father    Heart attack Father    Hyperlipidemia Father     Allergies  Allergen Reactions   Statins Other (See Comments)    fatigue fatigue   Zetia [Ezetimibe] Other (See Comments)    Constipation   Niacin Rash   Niacin And Related Rash    CBC Latest Ref Rng & Units 04/17/2021 11/28/2020 02/17/2020  WBC 3.4 - 10.8 x10E3/uL 6.9 6.8 6.4  Hemoglobin 13.0 - 17.7 g/dL 15.2 13.8 14.7  Hematocrit 37.5 - 51.0 % 45.4 40.8 43.7  Platelets 150 - 450 x10E3/uL 258 223 175      CMP     Component Value Date/Time   NA 142 04/17/2021 1509   NA 136 07/19/2012 0615   K 4.3 04/17/2021 1509   K 3.6 07/19/2012 0615   CL 101 04/17/2021 1509   CL 103 07/19/2012 0615   CO2 26 04/17/2021 1509   CO2 26 07/19/2012 0615   GLUCOSE 90 04/17/2021 1509   GLUCOSE 162 (H) 08/22/2017 1257   GLUCOSE 115 (H) 07/19/2012 0615   BUN 16 04/17/2021 1509   BUN 17 07/19/2012 0615   CREATININE 1.30 (H) 06/15/2021 1509   CREATININE 1.12 07/19/2012 0615   CALCIUM 10.2 04/17/2021 1509   CALCIUM 7.7 (L) 07/19/2012 0615   PROT 7.3 04/17/2021 1509   PROT 5.7 (L) 07/19/2012 0615   ALBUMIN 5.0 (H) 04/17/2021 1509   ALBUMIN 3.0 (L) 07/19/2012 0615   AST 19 04/17/2021 1509   AST 32 09/23/2016 1556   AST 46 (H) 07/19/2012 0615   ALT 20 04/17/2021 1509   ALT 19 09/23/2016 1556   ALT 30 07/19/2012 0615   ALKPHOS 106 04/17/2021 1509   ALKPHOS 80 07/19/2012 0615   BILITOT 0.2 04/17/2021 1509   BILITOT 0.3 07/19/2012 0615   GFRNONAA 69 02/17/2020 1439   GFRNONAA >60 07/19/2012 0615   GFRAA 79 02/17/2020 1439   GFRAA >60 07/19/2012 0615     No results found.     Assessment & Plan:   1. Bilateral carotid artery stenosis Recommend:  The patient remains asymptomatic with respect to the carotid stenosis.  However, the patient has  now progressed and has a lesion the is >75%.  Patient's CT angiography of the carotid arteries confirms >  75% bilateral ICA stenosis.  The anatomical considerations support surgery over stenting.  This was discussed in detail with the patient.  The patient does indeed need surgery, therefore, cardiac clearance will be arranged. Once cleared the patient will be scheduled for surgery.  The risks, benefits and alternative therapies were reviewed in detail with the patient.  All questions were answered.  The patient agrees to proceed with surgery of the left carotid artery.  We will plan on following with surgery of the right and approximately 6 weeks after  Continue antiplatelet therapy as prescribed. Continue management of CAD, HTN and Hyperlipidemia. Healthy heart diet, encouraged exercise at least 4 times per week.   - Ambulatory referral to Cardiology  2. Mixed hyperlipidemia Continue statin as ordered and reviewed, no changes at this time   3. Primary hypertension Continue antihypertensive medications as already ordered, these medications have been reviewed and there are no changes at this time.    Current Outpatient Medications on File Prior to Visit  Medication Sig Dispense Refill   amLODipine (NORVASC) 2.5 MG tablet Take 1 tablet (2.5 mg total) by mouth daily. 90 tablet 1   aspirin 81 MG tablet Take 81 mg by mouth daily.     naproxen (NAPROSYN) 500 MG tablet TAKE 1 TABLET BY MOUTH 2 TIMES DAILY WITH A MEAL. 60 tablet 2   rosuvastatin (CRESTOR) 5 MG tablet Take 1 tablet (5 mg total) by mouth every other day. 45 tablet 1   valsartan (DIOVAN) 40 MG tablet TAKE 1 TABLET BY MOUTH EVERY DAY 90 tablet 1   No current facility-administered medications on file prior to visit.    There are no Patient Instructions on file for this visit. No follow-ups on file.   Kris Hartmann, NP

## 2021-07-10 ENCOUNTER — Other Ambulatory Visit: Payer: Self-pay | Admitting: Family Medicine

## 2021-07-10 NOTE — Telephone Encounter (Signed)
Requested Prescriptions  Pending Prescriptions Disp Refills   naproxen (NAPROSYN) 500 MG tablet [Pharmacy Med Name: NAPROXEN 500 MG TABLET] 60 tablet 2    Sig: TAKE 1 TABLET BY MOUTH 2 TIMES DAILY WITH A MEAL.     Analgesics:  NSAIDS Failed - 07/10/2021  1:37 AM      Failed - Cr in normal range and within 360 days    Creatinine  Date Value Ref Range Status  07/19/2012 1.12 0.60 - 1.30 mg/dL Final   Creatinine, Ser  Date Value Ref Range Status  06/15/2021 1.30 (H) 0.61 - 1.24 mg/dL Final         Passed - HGB in normal range and within 360 days    Hemoglobin  Date Value Ref Range Status  04/17/2021 15.2 13.0 - 17.7 g/dL Final         Passed - Patient is not pregnant      Passed - Valid encounter within last 12 months    Recent Outpatient Visits          2 months ago Neck pain   [place], Megan P, DO   4 months ago Neck pain   [place], Megan P, DO   7 months ago Coronary artery disease involving native coronary artery of native heart without angina pectoris   [place], Megan P, DO   1 year ago Routine general medical examination at a health care facility   [place], [place], DO   1 year ago Essential hypertension   [place], Barb Merino, DO      Future Appointments            In 3 weeks Wynetta Emery, Barb Merino, DO [place], [place]   In 5 months  [employer], [place]

## 2021-07-13 ENCOUNTER — Telehealth: Payer: Self-pay | Admitting: Family Medicine

## 2021-07-13 NOTE — Telephone Encounter (Signed)
113/2023 Received medical records request for pt by [facility]. Placed in medical records request folder to be picked up, completed, and sent. Advise it takes about a month for records to be mailed out.

## 2021-07-19 ENCOUNTER — Ambulatory Visit: Payer: Medicare HMO | Admitting: Family Medicine

## 2021-07-24 ENCOUNTER — Ambulatory Visit: Payer: Medicare HMO | Admitting: Family Medicine

## 2021-08-02 ENCOUNTER — Other Ambulatory Visit: Payer: Self-pay

## 2021-08-02 ENCOUNTER — Ambulatory Visit (INDEPENDENT_AMBULATORY_CARE_PROVIDER_SITE_OTHER): Payer: Medicare Other | Admitting: Family Medicine

## 2021-08-02 ENCOUNTER — Encounter: Payer: Self-pay | Admitting: Family Medicine

## 2021-08-02 VITALS — BP 121/70 | HR 56 | Temp 98.4°F | Wt 167.0 lb

## 2021-08-02 DIAGNOSIS — R011 Cardiac murmur, unspecified: Secondary | ICD-10-CM | POA: Diagnosis not present

## 2021-08-02 DIAGNOSIS — E7800 Pure hypercholesterolemia, unspecified: Secondary | ICD-10-CM | POA: Diagnosis not present

## 2021-08-02 DIAGNOSIS — Z01810 Encounter for preprocedural cardiovascular examination: Secondary | ICD-10-CM | POA: Diagnosis not present

## 2021-08-02 DIAGNOSIS — H538 Other visual disturbances: Secondary | ICD-10-CM

## 2021-08-02 DIAGNOSIS — I6529 Occlusion and stenosis of unspecified carotid artery: Secondary | ICD-10-CM | POA: Insufficient documentation

## 2021-08-02 DIAGNOSIS — I2510 Atherosclerotic heart disease of native coronary artery without angina pectoris: Secondary | ICD-10-CM

## 2021-08-02 DIAGNOSIS — E782 Mixed hyperlipidemia: Secondary | ICD-10-CM | POA: Diagnosis not present

## 2021-08-02 DIAGNOSIS — G459 Transient cerebral ischemic attack, unspecified: Secondary | ICD-10-CM | POA: Insufficient documentation

## 2021-08-02 DIAGNOSIS — I6523 Occlusion and stenosis of bilateral carotid arteries: Secondary | ICD-10-CM | POA: Diagnosis not present

## 2021-08-02 DIAGNOSIS — R7301 Impaired fasting glucose: Secondary | ICD-10-CM | POA: Diagnosis not present

## 2021-08-02 DIAGNOSIS — Z01818 Encounter for other preprocedural examination: Secondary | ICD-10-CM | POA: Diagnosis not present

## 2021-08-02 DIAGNOSIS — I10 Essential (primary) hypertension: Secondary | ICD-10-CM

## 2021-08-02 LAB — URINALYSIS, ROUTINE W REFLEX MICROSCOPIC
Bilirubin, UA: NEGATIVE
Glucose, UA: NEGATIVE
Ketones, UA: NEGATIVE
Leukocytes,UA: NEGATIVE
Nitrite, UA: NEGATIVE
Protein,UA: NEGATIVE
RBC, UA: NEGATIVE
Specific Gravity, UA: >1.03 — ABNORMAL HIGH (ref 1.005–1.030)
Urobilinogen, Ur: 0.2 mg/dL (ref 0.2–1.0)
pH, UA: 6 (ref 5.0–7.5)

## 2021-08-02 LAB — MICROALBUMIN, URINE WAIVED
Creatinine, Urine Waived: 200 mg/dL (ref 10–300)
Microalb, Ur Waived: 10 mg/L (ref 0–19)
Microalb/Creat Ratio: <30 mg/g (ref <30)

## 2021-08-02 LAB — BAYER DCA HB A1C WAIVED: HB A1C (BAYER DCA - WAIVED): 6.2 % — ABNORMAL HIGH (ref 4.8–5.6)

## 2021-08-02 MED ORDER — AMLODIPINE BESYLATE 2.5 MG PO TABS
2.5000 mg | ORAL_TABLET | Freq: Every day | ORAL | 1 refills | Status: DC
Start: 2021-08-02 — End: 2022-01-07

## 2021-08-02 MED ORDER — ROSUVASTATIN CALCIUM 5 MG PO TABS
5.0000 mg | ORAL_TABLET | ORAL | 1 refills | Status: DC
Start: 2021-08-02 — End: 2021-09-13

## 2021-08-02 MED ORDER — VALSARTAN 40 MG PO TABS: 40.0000 mg | ORAL_TABLET | Freq: Every day | ORAL | 1 refills | Status: DC

## 2021-08-02 NOTE — Assessment & Plan Note (Signed)
Following with cardiology. Continue to monitor. Call with any concerns.

## 2021-08-02 NOTE — Assessment & Plan Note (Signed)
Statin intolerant. Will check labs. Now with ?TIA may need repatha. Await results and treat as needed.

## 2021-08-02 NOTE — Assessment & Plan Note (Signed)
Known carotid stenosis. To have surgery. BP under good control. On aspirin. Likely will need repatha to get cholesterol under better control. Labs drawn today. Will check CT head. Await results.

## 2021-08-02 NOTE — Assessment & Plan Note (Signed)
Needs to have surgery. To be cleared by cardiology. Will check labs for clearance from our end. Await results.

## 2021-08-02 NOTE — Progress Notes (Signed)
BP 121/70    Pulse (!) 56    Temp 98.4 F (36.9 C)    Wt 167 lb (75.8 kg)    SpO2 95%    BMI 26.95 kg/m    Subjective:    Patient ID: Nathan Fields, male    DOB: 12-08-1950, 71 y.o.   MRN: [id number]  HPI: Nathan Fields is a 71 y.o. male  Chief Complaint  Patient presents with   Hypertension   Hyperlipidemia   IFG   Blurred Vision    Patient states about 3-4 days ago he felt something pop on the left side of his face and his vision became blurry   To have carotid endarterectomy done. Saw cardiology this AM and they are planning stress test for clearance. Over the weekend he had 3 hours where he had pain in his head followed by inability to see out of his L eye and "feeling like it was rolling around." It's resolved now, but he's never had something like this before.   HYPERTENSION / HYPERLIPIDEMIA Satisfied with current treatment? yes Duration of hypertension: chronic BP monitoring frequency: not checking BP medication side effects: no Past BP meds: amlodipine, valsartan Duration of hyperlipidemia: chronic Cholesterol medication side effects: yes Cholesterol supplements: none Past cholesterol medications: crestor Medication compliance: good compliance Aspirin: yes Recent stressors: no Recurrent headaches: no Visual changes: yes Palpitations: no Dyspnea: no Chest pain: no Lower extremity edema: no Dizzy/lightheaded: no  Impaired Fasting Glucose HbA1C:  Lab Results  Component Value Date   HGBA1C 6.1 (H) 04/17/2021   Duration of elevated blood sugar: chronic Polydipsia: no Polyuria: no Weight change: no Visual disturbance: yes Glucose Monitoring: no Diabetic Education: Not Completed Family history of diabetes: yes   Relevant past medical, surgical, family and social history reviewed and updated as indicated. Interim medical history since our last visit reviewed. Allergies and medications reviewed and updated.  Review of Systems  Constitutional:  Negative.   HENT: Negative.    Eyes:  Positive for visual disturbance. Negative for photophobia, pain, discharge, redness and itching.  Respiratory: Negative.    Cardiovascular: Negative.   Musculoskeletal:  Positive for arthralgias, back pain and myalgias. Negative for gait problem, joint swelling, neck pain and neck stiffness.  Skin: Negative.   Neurological:  Positive for headaches. Negative for dizziness, tremors, seizures, syncope, facial asymmetry, speech difficulty, weakness, light-headedness and numbness.  Hematological: Negative.   Psychiatric/Behavioral: Negative.     Per HPI unless specifically indicated above     Objective:    BP 121/70    Pulse (!) 56    Temp 98.4 F (36.9 C)    Wt 167 lb (75.8 kg)    SpO2 95%    BMI 26.95 kg/m   Wt Readings from Last 3 Encounters:  08/02/21 167 lb (75.8 kg)  06/26/21 167 lb (75.8 kg)  04/17/21 166 lb (75.3 kg)    Physical Exam Vitals and nursing note reviewed.  Constitutional:      General: He is not in acute distress.    Appearance: Normal appearance. He is not ill-appearing, toxic-appearing or diaphoretic.  HENT:     Head: Normocephalic and atraumatic.     Right Ear: External ear normal.     Left Ear: External ear normal.     Nose: Nose normal.     Mouth/Throat:     Mouth: Mucous membranes are moist.     Pharynx: Oropharynx is clear.  Eyes:     General: No scleral icterus.  Right eye: No discharge.        Left eye: No discharge.     Extraocular Movements: Extraocular movements intact.     Conjunctiva/sclera: Conjunctivae normal.     Pupils: Pupils are equal, round, and reactive to light.  Cardiovascular:     Rate and Rhythm: Normal rate and regular rhythm.     Pulses: Normal pulses.     Heart sounds: Normal heart sounds. No murmur heard.   No friction rub. No gallop.  Pulmonary:     Effort: Pulmonary effort is normal. No respiratory distress.     Breath sounds: Normal breath sounds. No stridor. No wheezing,  rhonchi or rales.  Chest:     Chest wall: No tenderness.  Musculoskeletal:        General: Normal range of motion.     Cervical back: Normal range of motion and neck supple.  Skin:    General: Skin is warm and dry.     Capillary Refill: Capillary refill takes less than 2 seconds.     Coloration: Skin is not jaundiced or pale.     Findings: No bruising, erythema, lesion or rash.  Neurological:     General: No focal deficit present.     Mental Status: He is alert and oriented to person, place, and time. Mental status is at baseline.  Psychiatric:        Mood and Affect: Mood normal.        Behavior: Behavior normal.        Thought Content: Thought content normal.        Judgment: Judgment normal.    Results for orders placed or performed during the hospital encounter of 06/15/21  I-STAT creatinine  Result Value Ref Range   Creatinine, Ser 1.30 (H) 0.61 - 1.24 mg/dL      Assessment & Plan:   Problem List Items Addressed This Visit       Cardiovascular and Mediastinum   CAD (coronary artery disease)    Following with cardiology. Continue to monitor. Call with any concerns.       Relevant Medications   rosuvastatin (CRESTOR) 5 MG tablet   amLODipine (NORVASC) 2.5 MG tablet   valsartan (DIOVAN) 40 MG tablet   Hypertension    Under good control on current regimen. Continue current regimen. Continue to monitor. Call with any concerns. Refills given. Labs drawn today.        Relevant Medications   rosuvastatin (CRESTOR) 5 MG tablet   amLODipine (NORVASC) 2.5 MG tablet   valsartan (DIOVAN) 40 MG tablet   Other Relevant Orders   Comprehensive metabolic panel   CBC with Differential/Platelet   TSH   Carotid stenosis    Needs to have surgery. To be cleared by cardiology. Will check labs for clearance from our end. Await results.       Relevant Medications   rosuvastatin (CRESTOR) 5 MG tablet   amLODipine (NORVASC) 2.5 MG tablet   valsartan (DIOVAN) 40 MG tablet   TIA  (transient ischemic attack) - Primary    Known carotid stenosis. To have surgery. BP under good control. On aspirin. Likely will need repatha to get cholesterol under better control. Labs drawn today. Will check CT head. Await results.       Relevant Medications   rosuvastatin (CRESTOR) 5 MG tablet   amLODipine (NORVASC) 2.5 MG tablet   valsartan (DIOVAN) 40 MG tablet   Other Relevant Orders   CT HEAD WO CONTRAST (5MM)  Endocrine   Impaired fasting glucose   Relevant Orders   Comprehensive metabolic panel   CBC with Differential/Platelet   Bayer DCA Hb A1c Waived   Microalbumin, Urine Waived   Urinalysis, Routine w reflex microscopic     Other   Hyperlipidemia    Statin intolerant. Will check labs. Now with ?TIA may need repatha. Await results and treat as needed.       Relevant Medications   rosuvastatin (CRESTOR) 5 MG tablet   amLODipine (NORVASC) 2.5 MG tablet   valsartan (DIOVAN) 40 MG tablet   Other Relevant Orders   Comprehensive metabolic panel   CBC with Differential/Platelet   Lipid Panel w/o Chol/HDL Ratio   Other Visit Diagnoses     Blurred vision       Concern for TIA. Will check CT head and get him into opthalmology. Await results.   Relevant Orders   Ambulatory referral to Ophthalmology   CT HEAD WO CONTRAST (5MM)        Follow up plan: Return in about 6 months (around 01/30/2022).

## 2021-08-02 NOTE — Assessment & Plan Note (Signed)
Under good control on current regimen. Continue current regimen. Continue to monitor. Call with any concerns. Refills given. Labs drawn today.   

## 2021-08-03 LAB — CBC WITH DIFFERENTIAL/PLATELET
Basophils Absolute: 0.1 10*3/uL (ref 0.0–0.2)
Basos: 1 %
EOS (ABSOLUTE): 0.1 10*3/uL (ref 0.0–0.4)
Eos: 2 %
Hematocrit: 45.8 % (ref 37.5–51.0)
Hemoglobin: 15 g/dL (ref 13.0–17.7)
Immature Grans (Abs): 0 10*3/uL (ref 0.0–0.1)
Immature Granulocytes: 0 %
Lymphocytes Absolute: 2.5 10*3/uL (ref 0.7–3.1)
Lymphs: 40 %
MCH: 29.6 pg (ref 26.6–33.0)
MCHC: 32.8 g/dL (ref 31.5–35.7)
MCV: 90 fL (ref 79–97)
Monocytes Absolute: 0.5 10*3/uL (ref 0.1–0.9)
Monocytes: 8 %
Neutrophils Absolute: 3.2 10*3/uL (ref 1.4–7.0)
Neutrophils: 49 %
Platelets: 245 10*3/uL (ref 150–450)
RBC: 5.07 x10E6/uL (ref 4.14–5.80)
RDW: 13 % (ref 11.6–15.4)
WBC: 6.4 10*3/uL (ref 3.4–10.8)

## 2021-08-03 LAB — LIPID PANEL W/O CHOL/HDL RATIO
Cholesterol, Total: 225 mg/dL — ABNORMAL HIGH (ref 100–199)
HDL: 46 mg/dL (ref >39)
LDL Chol Calc (NIH): 151 mg/dL — ABNORMAL HIGH (ref 0–99)
Triglycerides: 153 mg/dL — ABNORMAL HIGH (ref 0–149)
VLDL Cholesterol Cal: 28 mg/dL (ref 5–40)

## 2021-08-03 LAB — COMPREHENSIVE METABOLIC PANEL
ALT: 19 IU/L (ref 0–44)
AST: 22 IU/L (ref 0–40)
Albumin/Globulin Ratio: 2.2 (ref 1.2–2.2)
Albumin: 4.7 g/dL (ref 3.8–4.8)
Alkaline Phosphatase: 109 IU/L (ref 44–121)
BUN/Creatinine Ratio: 15 (ref 10–24)
BUN: 17 mg/dL (ref 8–27)
Bilirubin Total: 0.4 mg/dL (ref 0.0–1.2)
CO2: 24 mmol/L (ref 20–29)
Calcium: 9.9 mg/dL (ref 8.6–10.2)
Chloride: 102 mmol/L (ref 96–106)
Creatinine, Ser: 1.1 mg/dL (ref 0.76–1.27)
Globulin, Total: 2.1 g/dL (ref 1.5–4.5)
Glucose: 86 mg/dL (ref 70–99)
Potassium: 4.6 mmol/L (ref 3.5–5.2)
Sodium: 140 mmol/L (ref 134–144)
Total Protein: 6.8 g/dL (ref 6.0–8.5)
eGFR: 73 mL/min/{1.73_m2} (ref >59)

## 2021-08-03 LAB — TSH: TSH: 1.79 u[IU]/mL (ref 0.450–4.500)

## 2021-08-06 ENCOUNTER — Encounter: Payer: Self-pay | Admitting: Family Medicine

## 2021-08-08 ENCOUNTER — Ambulatory Visit
Admission: RE | Admit: 2021-08-08 | Discharge: 2021-08-08 | Disposition: A | Discharge status: Home / Self Care | Payer: Medicare Other | Source: Ambulatory Visit | Attending: Family Medicine | Admitting: Family Medicine

## 2021-08-08 DIAGNOSIS — G459 Transient cerebral ischemic attack, unspecified: Secondary | ICD-10-CM | POA: Diagnosis not present

## 2021-08-08 DIAGNOSIS — H538 Other visual disturbances: Secondary | ICD-10-CM | POA: Diagnosis not present

## 2021-08-08 DIAGNOSIS — G319 Degenerative disease of nervous system, unspecified: Secondary | ICD-10-CM | POA: Diagnosis not present

## 2021-08-15 ENCOUNTER — Ambulatory Visit: Payer: Medicare HMO | Admitting: Pain Medicine

## 2021-08-16 DIAGNOSIS — Z01810 Encounter for preprocedural cardiovascular examination: Secondary | ICD-10-CM | POA: Diagnosis not present

## 2021-08-16 DIAGNOSIS — I5189 Other ill-defined heart diseases: Secondary | ICD-10-CM

## 2021-08-16 DIAGNOSIS — R011 Cardiac murmur, unspecified: Secondary | ICD-10-CM | POA: Diagnosis not present

## 2021-08-16 HISTORY — DX: Other ill-defined heart diseases: I51.89

## 2021-08-22 ENCOUNTER — Telehealth (INDEPENDENT_AMBULATORY_CARE_PROVIDER_SITE_OTHER): Payer: Self-pay

## 2021-08-22 NOTE — Telephone Encounter (Signed)
Spoke with the patient's spouse and he is scheduled with Dr. Lucky Cowboy for a [facility] on 09/06/21 at the [facility]. Pre-op phone call on 08/30/21 between 8-1 pm and covid testing on 09/04/21 between 8-12 pm at the [facility]. Pre-surgical instructions were discussed and will be mailed.

## 2021-08-24 DIAGNOSIS — M2578 Osteophyte, vertebrae: Secondary | ICD-10-CM | POA: Diagnosis not present

## 2021-08-24 DIAGNOSIS — M47814 Spondylosis without myelopathy or radiculopathy, thoracic region: Secondary | ICD-10-CM | POA: Diagnosis not present

## 2021-08-24 DIAGNOSIS — M47816 Spondylosis without myelopathy or radiculopathy, lumbar region: Secondary | ICD-10-CM | POA: Diagnosis not present

## 2021-08-24 DIAGNOSIS — I10 Essential (primary) hypertension: Secondary | ICD-10-CM | POA: Diagnosis not present

## 2021-08-24 DIAGNOSIS — M5489 Other dorsalgia: Secondary | ICD-10-CM | POA: Diagnosis not present

## 2021-08-24 DIAGNOSIS — I2510 Atherosclerotic heart disease of native coronary artery without angina pectoris: Secondary | ICD-10-CM | POA: Diagnosis not present

## 2021-08-24 DIAGNOSIS — Z01810 Encounter for preprocedural cardiovascular examination: Secondary | ICD-10-CM | POA: Diagnosis not present

## 2021-08-24 DIAGNOSIS — R011 Cardiac murmur, unspecified: Secondary | ICD-10-CM | POA: Diagnosis not present

## 2021-08-24 DIAGNOSIS — M47817 Spondylosis without myelopathy or radiculopathy, lumbosacral region: Secondary | ICD-10-CM | POA: Diagnosis not present

## 2021-08-24 DIAGNOSIS — E782 Mixed hyperlipidemia: Secondary | ICD-10-CM | POA: Diagnosis not present

## 2021-08-24 DIAGNOSIS — M546 Pain in thoracic spine: Secondary | ICD-10-CM | POA: Diagnosis not present

## 2021-08-30 ENCOUNTER — Other Ambulatory Visit (INDEPENDENT_AMBULATORY_CARE_PROVIDER_SITE_OTHER): Payer: Self-pay | Admitting: Nurse Practitioner

## 2021-08-30 ENCOUNTER — Other Ambulatory Visit: Payer: Self-pay

## 2021-08-30 ENCOUNTER — Other Ambulatory Visit
Admission: RE | Admit: 2021-08-30 | Discharge: 2021-08-30 | Disposition: A | Discharge status: Home / Self Care | Payer: Medicare Other | Source: Ambulatory Visit | Attending: Vascular Surgery | Admitting: Vascular Surgery

## 2021-08-30 DIAGNOSIS — I6523 Occlusion and stenosis of bilateral carotid arteries: Secondary | ICD-10-CM

## 2021-08-30 HISTORY — DX: Pneumonia, unspecified organism: J18.9

## 2021-08-30 HISTORY — DX: Cerebral infarction, unspecified: I63.9

## 2021-08-30 HISTORY — DX: Personal history of urinary calculi: Z87.442

## 2021-08-30 NOTE — Patient Instructions (Addendum)
Your procedure is scheduled on: 09/06/21 - Thursday ?Report to the Registration Desk on the 1st floor of the [facility]. ?To find out your arrival time, please call [phone] between 1PM - 3PM on: 09/05/21 - Wednesday ?Report to [facility] for Labs/EKG and Covid test on 09/04/21 at 8:30 am.  ? ?REMEMBER: ?Instructions that are not followed completely may result in serious medical risk, up to and including death; or upon the discretion of your surgeon and anesthesiologist your surgery may need to be rescheduled. ? ?Do not eat food or drink any fluids  after midnight the night before surgery.  ?No gum chewing, lozengers or hard candies. ? ?TAKE THESE MEDICATIONS THE MORNING OF SURGERY WITH A SIP OF WATER: ? ?- amLODipine (NORVASC) 2.5 MG tablet ? ?Follow recommendations from Cardiologist, Pulmonologist or PCP regarding stopping Aspirin, Coumadin, Plavix, Eliquis, Pradaxa, or Pletal. ? ?One week prior to surgery: ?Stop Anti-inflammatories (NSAIDS) such as Advil, Aleve, Ibuprofen, Motrin, Naproxen, Naprosyn and Aspirin based products such as Excedrin, Goodys Powder, BC Powder. ? ?Stop ANY OVER THE COUNTER supplements until after surgery. ? ?You may however, continue to take Tylenol if needed for pain up until the day of surgery. ? ?No Alcohol for 24 hours before or after surgery. ? ?No Smoking including e-cigarettes for 24 hours prior to surgery.  ?No chewable tobacco products for at least 6 hours prior to surgery.  ?No nicotine patches on the day of surgery. ? ?Do not use any "recreational" drugs for at least a week prior to your surgery.  ?Please be advised that the combination of cocaine and anesthesia may have negative outcomes, up to and including death. ?If you test positive for cocaine, your surgery will be cancelled. ? ?On the morning of surgery brush your teeth with toothpaste and water, you may rinse your mouth with mouthwash if you wish. ?Do not swallow any toothpaste or mouthwash. ? ?Do not  wear jewelry, make-up, hairpins, clips or nail polish. ? ?Do not wear lotions, powders, or perfumes.  ? ?Do not shave body from the neck down 48 hours prior to surgery just in case you cut yourself which could leave a site for infection.  ?Also, freshly shaved skin may become irritated if using the CHG soap. ? ?Contact lenses, hearing aids and dentures may not be worn into surgery. ? ?Do not bring valuables to the hospital. [facility] is not responsible for any missing/lost belongings or valuables.  ? ?Notify your doctor if there is any change in your medical condition (cold, fever, infection). ? ?Wear comfortable clothing (specific to your surgery type) to the hospital. ? ?After surgery, you can help prevent lung complications by doing breathing exercises.  ?Take deep breaths and cough every 1-2 hours. Your doctor may order a device called an Incentive Spirometer to help you take deep breaths. ?When coughing or sneezing, hold a pillow firmly against your incision with both hands. This is called ?splinting.? Doing this helps protect your incision. It also decreases belly discomfort. ? ?If you are being admitted to the hospital overnight, leave your suitcase in the car. ?After surgery it may be brought to your room. ? ?If you are being discharged the day of surgery, you will not be allowed to drive home. ?You will need a responsible adult (18 years or older) to drive you home and stay with you that night.  ? ?If you are taking public transportation, you will need to have a responsible adult (18 years or older) with  you. ?Please confirm with your physician that it is acceptable to use public transportation.  ? ?Please call the [facility] at [phone] if you have any questions about these instructions. ? ?Surgery Visitation Policy: ? ?Patients undergoing a surgery or procedure may have one family member or support person with them as long as that person is not COVID-19 positive or experiencing  its symptoms.  ?That person may remain in the waiting area during the procedure and may rotate out with other people. ? ?Inpatient Visitation:   ? ?Visiting hours are 7 a.m. to 8 p.m. ?Up to two visitors ages 16+ are allowed at one time in a patient room. The visitors may rotate out with other people during the day. Visitors must check out when they leave, or other visitors will not be allowed. One designated support person may remain overnight. ?The visitor must pass COVID-19 screenings, use hand sanitizer when entering and exiting the patient?s room and wear a mask at all times, including in the patient?s room. ?Patients must also wear a mask when staff or their visitor are in the room. ?Masking is required regardless of vaccination status.  ?

## 2021-09-03 ENCOUNTER — Encounter: Payer: Self-pay | Admitting: Vascular Surgery

## 2021-09-03 NOTE — Progress Notes (Signed)
Perioperative Services  Pre-Admission/Anesthesia Testing Clinical Review  Date: 09/04/21  Patient Demographics:  Name: Nathan Fields DOB:   [birth year] MRN:   [id number]  Planned Surgical Procedure(s):    Case: [id number] Date/Time: 09/06/21 1230   Procedure: ENDARTERECTOMY CAROTID (Left)   Anesthesia type: General   Pre-op diagnosis: CAROTID ARTERY STENOSIS   Location: [facility]   Surgeons: Algernon Huxley, MD   NOTE: Available PAT nursing documentation and vital signs have been reviewed. Clinical nursing staff has updated patient's PMH/PSHx, current medication list, and drug allergies/intolerances to ensure comprehensive history available to assist in medical decision making as it pertains to the aforementioned surgical procedure and anticipated anesthetic course. Extensive review of available clinical information performed. [place] PMH and PSHx updated with any diagnoses/procedures that  may have been inadvertently omitted during his intake with the pre-admission testing department's nursing staff.  Clinical Discussion:  Nathan Fields is a 71 y.o. male who is submitted for pre-surgical anesthesia review and clearance prior to him undergoing the above procedure. Patient has never been a smoker. Pertinent PMH includes: CAD, cardiac murmur, TIA, bilateral carotid artery stenosis, diastolic dysfunction, HTN, HLD, T2DM, GERD (no daily Tx), OA, chronic low back pain, anxiety, depression.  Patient is followed by cardiology Clayborn Bigness, MD). He was last seen in the cardiology clinic on 08/24/2021; notes reviewed.  At the time of his clinic visit, patient doing well overall from a cardiovascular perspective.  He denied any episodes of chest pain, shortness of breath, PND, orthopnea, palpitations, significant peripheral edema, vertiginous symptoms, or presyncope/syncope.  PMH significant for cardiovascular diagnoses.  Patient underwent diagnostic left heart  catheterization on 09/09/2007 revealing a normal left ventricular systolic function with an EF of 60%.  There was multivessel CAD; 30% distal LM, 25% proximal LAD, 25% mid LAD, 40% ostial LCx, 50% proximal LCx, 30% proximal ramus intermedius, and 100% proximal RCA.  RCA with good collateralization, therefore intervention was deferred opting for medical management.  Carotid Doppler study performed on 03/21/2021 revealed moderate (50-69%) stenosis of the proximal RIGHT internal carotid artery.  There was severe (70-99%) stenosis in the proximal LEFT internal carotid artery.  Vertebral arteries patent with normal antegrade flow.  TTE performed on 08/16/2021 revealed normal left ventricular systolic function with an EF of >55%.  Diastolic Doppler parameters consistent with impaired relaxation (G1DD).  There was mild AR/MR/TR and trivial PR.  There was no significant transvalvular gradient to suggest aortic or mitral valve stenosis.  Myocardial perfusion imaging study performed on 08/16/2021 revealing a normal left ventricular systolic function 54%.  There were no regional wall motion abnormalities.  There was no evidence of stress-induced myocardial ischemia or arrhythmia.  Study determined to be normal and low risk.  Blood pressure mildly elevated at 140/68 on currently prescribed CCB and ARB therapies.  Patient is on a statin for his HLD and further ASCVD prevention. T2DM well-controlled on currently prescribed regimen; last HgbA1c was 6.2% when checked on 08/02/2021. Functional capacity, as defined by [facility], is documented as being >/= 4 METS.  No changes were made to his medication regimen.  Patient to follow-up with outpatient cardiology in 6 months or sooner if needed.  Nathan Fields is scheduled for an LEFT CAROTID ENDARTERECTOMY on 09/06/2021 with Dr. Leotis Pain, MD.  Given patient's past medical history significant for cardiovascular diagnoses, presurgical cardiac clearance was sought by the PAT team.  Per cardiology, "this patient is optimized for surgery and may  proceed with the planned procedural course with a MILD TO MODERATE risk of significant perioperative cardiovascular complications".  In review of his medication reconciliation, it is noted the patient is on daily antiplatelet therapy.  Per Dr. Bunnie Domino standing protocol for this procedure, patient may continue his daily low-dose ASA throughout the perioperative course.Standing surgical orders for this procedure  Patient denies previous perioperative complications with anesthesia in the past. In review of the available records, it is noted that patient underwent a general anesthetic course here (ASA III) in 12/2016 without documented complications.   Vitals with BMI 08/02/2021 06/26/2021 04/17/2021  Height - - '5\' 6"'$   Weight 167 lbs 167 lbs 166 lbs  BMI - - 31.51  Systolic 761 607 371  Diastolic 70 75 72  Pulse 56 64 62    Providers/Specialists:   NOTE: Primary physician provider listed below. Patient may have been seen by APP or partner within same practice.   PROVIDER ROLE / SPECIALTY LAST Imelda Pillow, MD Vascular Surgery (Surgeon) 06/26/2021  Valerie Roys, DO Primary Care Provider 08/02/2021  Katrine Coho, MD Cardiology 08/24/2021   Allergies:  Statins, Zetia [ezetimibe], Niacin, and Niacin and related  Current Home Medications:   No current facility-administered medications for this encounter.    acetaminophen (TYLENOL) 500 MG tablet   amLODipine (NORVASC) 2.5 MG tablet   aspirin 81 MG tablet   ibuprofen (ADVIL) 600 MG tablet   rosuvastatin (CRESTOR) 5 MG tablet   valsartan (DIOVAN) 40 MG tablet   History:   Past Medical History:  Diagnosis Date   Anxiety    Aortic atherosclerosis (HCC)    Arthritis of right foot    B12 deficiency    Bilateral carotid artery stenosis 03/21/2021   a.) Carotid doppler 03/21/2021: 06-26% pRICA, 94-85% pLICA   CAD (coronary artery disease) 09/09/2007   a.) LHC  09/09/2007: EF 60%; 30% dLM, 25% pLAD, 25% mLAD, 40% oLCx, 50% pLCx, 30% pRI, 100% pRCA with good collateral flow; no interventions opting for med mgmt.   Chronic low back pain    Depression    Diastolic dysfunction 46/27/0350   a.)  TTE 08/16/2021: EF >55%; mild AR/MR/TR, trivial PR; G1DD.   GERD (gastroesophageal reflux disease)    History of kidney stones    Hyperlipidemia    Hypertension    Murmur    Plantar fasciitis    Pneumonia    Right thyroid nodule 06/15/2021   a.) CTA neck 06/15/2021: measured 24m.   Syncope    T[place] (type 2 diabetes mellitus) [place]    TIA (transient ischemic attack)    Past Surgical History:  Procedure Laterality Date   CARDIAC CATHETERIZATION     COLONOSCOPY  2011   Polyp removed, follow up in 5 years   COLONOSCOPY WITH PROPOFOL N/A 01/14/2017   Procedure: COLONOSCOPY WITH PROPOFOL;  Surgeon: AJonathon Bellows MD;  [facility]  Service: Endoscopy;  Laterality: N/A;   Family History  Problem Relation Age of Onset   Heart disease Mother    Hyperlipidemia Mother    Diabetes Mother    Congestive Heart Failure Mother    Hypertension Mother    Diabetes Father    Heart attack Father    Hyperlipidemia Father    Social History   Tobacco Use   Smoking status: Never   Smokeless tobacco: Never  Vaping Use   Vaping Use: Never used  Substance Use Topics   Alcohol use: Not Currently    Comment: on rare  occasion   Drug use: Yes    Types: Marijuana    Pertinent Clinical Results:  LABS: Labs reviewed: Acceptable for surgery.  Hospital Outpatient Visit on 09/04/2021  Component Date Value Ref Range Status   WBC 09/04/2021 6.7  4.0 - 10.5 K/uL Final   RBC 09/04/2021 5.02  4.22 - 5.81 MIL/uL Final   Hemoglobin 09/04/2021 14.8  13.0 - 17.0 g/dL Final   HCT 09/04/2021 45.7  39.0 - 52.0 % Final   MCV 09/04/2021 91.0  80.0 - 100.0 fL Final   MCH 09/04/2021 29.5  26.0 - 34.0 pg Final   MCHC 09/04/2021 32.4  30.0 - 36.0 g/dL Final   RDW  09/04/2021 13.3  11.5 - 15.5 % Final   Platelets 09/04/2021 221  150 - 400 K/uL Final   nRBC 09/04/2021 0.0  0.0 - 0.2 % Final   Neutrophils Relative % 09/04/2021 54  % Final   Neutro Abs 09/04/2021 3.6  1.7 - 7.7 K/uL Final   Lymphocytes Relative 09/04/2021 33  % Final   Lymphs Abs 09/04/2021 2.2  0.7 - 4.0 K/uL Final   Monocytes Relative 09/04/2021 9  % Final   Monocytes Absolute 09/04/2021 0.6  0.1 - 1.0 K/uL Final   Eosinophils Relative 09/04/2021 2  % Final   Eosinophils Absolute 09/04/2021 0.2  0.0 - 0.5 K/uL Final   Basophils Relative 09/04/2021 2  % Final   Basophils Absolute 09/04/2021 0.1  0.0 - 0.1 K/uL Final   Immature Granulocytes 09/04/2021 0  % Final   Abs Immature Granulocytes 09/04/2021 0.02  0.00 - 0.07 K/uL Final   Performed at [facility], [place], [place] [address]   Sodium 09/04/2021 137  135 - 145 mmol/L Final   Potassium 09/04/2021 4.2  3.5 - 5.1 mmol/L Final   Chloride 09/04/2021 104  98 - 111 mmol/L Final   CO2 09/04/2021 25  22 - 32 mmol/L Final   Glucose, Bld 09/04/2021 101 (H)  70 - 99 mg/dL Final   Glucose reference range applies only to samples taken after fasting for at least 8 hours.   BUN 09/04/2021 21  8 - 23 mg/dL Final   Creatinine, Ser 09/04/2021 1.11  0.61 - 1.24 mg/dL Final   Calcium 09/04/2021 9.3  8.9 - 10.3 mg/dL Final   GFR, Estimated 09/04/2021 >60  >60 mL/min Final   Comment: (NOTE) Calculated using the CKD-EPI Creatinine Equation (2021)    Anion gap 09/04/2021 8  5 - 15 Final   Performed at [facility], [address] [place], [place] [address]   ABO/RH(D) 09/04/2021 PENDING   Incomplete   Antibody Screen 09/04/2021 PENDING   Incomplete   Sample Expiration 09/04/2021 09/18/2021,2359   Final   Extend sample reason 09/04/2021    Final                   Value:NO TRANSFUSIONS OR PREGNANCY IN THE PAST 3 MONTHS Performed at [facility], [address] [place], [place] [address]      ECG: Date: 09/04/2021 Time ECG obtained: 0905 AM Rate: 63 bpm Rhythm: normal sinus Axis (leads I and aVF): Normal Intervals: PR 144 ms. QRS 90 ms. QTc 417 ms. ST segment and T wave changes: No evidence of acute ST segment elevation or depression Comparison: Similar to previous tracing obtained on 02/23/2018   IMAGING / PROCEDURES: MYOCARDIAL PERFUSION IMAGING STUDY (LEXISCAN) performed on 08/16/2021 LVEF 61% Normal regional wall motion and myocardial thickening  No artifact Left ventricular cavity size normal No evidence of stress-induced myocardial ischemia or arrhythmia  TRANSTHORACIC ECHOCARDIOGRAM performed on 08/16/2021 LVEF >09% Diastolic Doppler parameters consistent with impaired relaxation (G1DD).  Trivial PR Mild AR, MR, TR No valvular stenosis No evidence of pericardial effusion  CT ANGIO NECK W OR WO CONTRAST performed on 06/15/2021 Greater than 75% severe stenosis of the proximal left internal carotid artery relative to the more distal vessel. Approximately 70% stenosis of the proximal right internal carotid artery with associated dense irregular calcifications. Less than 50% stenosis of the proximal vertebral arteries bilaterally. Multilevel spondylosis of the cervical spine as described. Incidental right thyroid nodule measures 7 mm. Aortic atherosclerosis   BILATERAL CAROTID DOPPLER performed on 03/21/2021 Moderate (50-69%) stenosis proximal RIGHT internal carotid artery secondary to bulky heterogeneous atherosclerotic plaque. Severe (70-99%) stenosis proximal LEFT internal carotid artery secondary to bulky heterogeneous atherosclerotic plaque. Vertebral arteries are patent with normal antegrade flow.  Impression and Plan:  Nathan Fields has been referred for pre-anesthesia review and clearance prior to him undergoing the planned anesthetic and procedural courses. Available labs, pertinent testing, and imaging results were personally reviewed by me. This  patient has been appropriately cleared by cardiology with an overall MILD TO MODERATE risk of significant perioperative cardiovascular complications.  Based on clinical review performed today (09/04/21), barring any significant acute changes in the patient's overall condition, it is anticipated that he will be able to proceed with the planned surgical intervention. Any acute changes in clinical condition may necessitate his procedure being postponed and/or cancelled. Patient will meet with anesthesia team (MD and/or CRNA) on the day of his procedure for preoperative evaluation/assessment. Questions regarding anesthetic course will be fielded at that time.   Pre-surgical instructions were reviewed with the patient during his PAT appointment and questions were fielded by PAT clinical staff. Patient was advised that if any questions or concerns arise prior to his procedure then he should return a call to PAT and/or his surgeon's office to discuss.  Honor Loh, MSN, APRN, FNP-C, CEN [facility]  Peri-operative Services Nurse Practitioner Phone: [phone] Fax: [phone] 09/04/21 1:26 PM  NOTE: This note has been prepared using Dragon dictation software. Despite my best ability to proofread, there is always the potential that unintentional transcriptional errors may still occur from this process.

## 2021-09-04 ENCOUNTER — Other Ambulatory Visit: Payer: Self-pay

## 2021-09-04 ENCOUNTER — Encounter: Payer: Self-pay | Admitting: Urgent Care

## 2021-09-04 ENCOUNTER — Other Ambulatory Visit
Admission: RE | Admit: 2021-09-04 | Discharge: 2021-09-04 | Disposition: A | Discharge status: Home / Self Care | Payer: Medicare Other | Source: Ambulatory Visit | Attending: Vascular Surgery | Admitting: Vascular Surgery

## 2021-09-04 ENCOUNTER — Encounter: Payer: Self-pay | Admitting: Vascular Surgery

## 2021-09-04 DIAGNOSIS — I6523 Occlusion and stenosis of bilateral carotid arteries: Secondary | ICD-10-CM

## 2021-09-04 DIAGNOSIS — Z01818 Encounter for other preprocedural examination: Secondary | ICD-10-CM | POA: Insufficient documentation

## 2021-09-04 DIAGNOSIS — Z20822 Contact with and (suspected) exposure to covid-19: Secondary | ICD-10-CM

## 2021-09-04 LAB — CBC WITH DIFFERENTIAL/PLATELET
Abs Immature Granulocytes: 0.02 10*3/uL (ref 0.00–0.07)
Basophils Absolute: 0.1 10*3/uL (ref 0.0–0.1)
Basophils Relative: 2 %
Eosinophils Absolute: 0.2 10*3/uL (ref 0.0–0.5)
Eosinophils Relative: 2 %
HCT: 45.7 % (ref 39.0–52.0)
Hemoglobin: 14.8 g/dL (ref 13.0–17.0)
Immature Granulocytes: 0 %
Lymphocytes Relative: 33 %
Lymphs Abs: 2.2 10*3/uL (ref 0.7–4.0)
MCH: 29.5 pg (ref 26.0–34.0)
MCHC: 32.4 g/dL (ref 30.0–36.0)
MCV: 91 fL (ref 80.0–100.0)
Monocytes Absolute: 0.6 10*3/uL (ref 0.1–1.0)
Monocytes Relative: 9 %
Neutro Abs: 3.6 10*3/uL (ref 1.7–7.7)
Neutrophils Relative %: 54 %
Platelets: 221 10*3/uL (ref 150–400)
RBC: 5.02 MIL/uL (ref 4.22–5.81)
RDW: 13.3 % (ref 11.5–15.5)
WBC: 6.7 10*3/uL (ref 4.0–10.5)
nRBC: 0 % (ref 0.0–0.2)

## 2021-09-04 LAB — BASIC METABOLIC PANEL
Anion gap: 8 (ref 5–15)
BUN: 21 mg/dL (ref 8–23)
CO2: 25 mmol/L (ref 22–32)
Calcium: 9.3 mg/dL (ref 8.9–10.3)
Chloride: 104 mmol/L (ref 98–111)
Creatinine, Ser: 1.11 mg/dL (ref 0.61–1.24)
GFR, Estimated: >60 mL/min (ref >60)
Glucose, Bld: 101 mg/dL — ABNORMAL HIGH (ref 70–99)
Potassium: 4.2 mmol/L (ref 3.5–5.1)
Sodium: 137 mmol/L (ref 135–145)

## 2021-09-04 LAB — TYPE AND SCREEN
ABO/RH(D): A POS
Antibody Screen: NEGATIVE

## 2021-09-04 LAB — SARS CORONAVIRUS 2 (TAT 6-24 HRS): SARS Coronavirus 2: NEGATIVE

## 2021-09-06 ENCOUNTER — Encounter
Admission: RE | Disposition: A | Discharge status: Home / Self Care | Payer: Self-pay | Source: Ambulatory Visit | Attending: Vascular Surgery

## 2021-09-06 ENCOUNTER — Other Ambulatory Visit: Payer: Self-pay

## 2021-09-06 ENCOUNTER — Inpatient Hospital Stay
Admission: RE | Admit: 2021-09-06 | Discharge: 2021-09-07 | DRG: 039 | Disposition: A | Discharge status: Home / Self Care | Payer: Medicare Other | Source: Ambulatory Visit | Attending: Vascular Surgery | Admitting: Vascular Surgery

## 2021-09-06 ENCOUNTER — Inpatient Hospital Stay: Payer: Medicare Other | Admitting: Urgent Care

## 2021-09-06 ENCOUNTER — Encounter: Payer: Self-pay | Admitting: Vascular Surgery

## 2021-09-06 DIAGNOSIS — G8929 Other chronic pain: Secondary | ICD-10-CM | POA: Diagnosis present

## 2021-09-06 DIAGNOSIS — I2510 Atherosclerotic heart disease of native coronary artery without angina pectoris: Secondary | ICD-10-CM | POA: Diagnosis present

## 2021-09-06 DIAGNOSIS — I6523 Occlusion and stenosis of bilateral carotid arteries: Secondary | ICD-10-CM | POA: Diagnosis present

## 2021-09-06 DIAGNOSIS — Z888 Allergy status to other drugs, medicaments and biological substances status: Secondary | ICD-10-CM

## 2021-09-06 DIAGNOSIS — I6529 Occlusion and stenosis of unspecified carotid artery: Secondary | ICD-10-CM | POA: Diagnosis not present

## 2021-09-06 DIAGNOSIS — F32A Depression, unspecified: Secondary | ICD-10-CM | POA: Diagnosis present

## 2021-09-06 DIAGNOSIS — I700 Atherosclerosis of aorta: Secondary | ICD-10-CM | POA: Diagnosis present

## 2021-09-06 DIAGNOSIS — K219 Gastro-esophageal reflux disease without esophagitis: Secondary | ICD-10-CM | POA: Diagnosis present

## 2021-09-06 DIAGNOSIS — F419 Anxiety disorder, unspecified: Secondary | ICD-10-CM | POA: Diagnosis present

## 2021-09-06 DIAGNOSIS — Z8673 Personal history of transient ischemic attack (TIA), and cerebral infarction without residual deficits: Secondary | ICD-10-CM | POA: Diagnosis not present

## 2021-09-06 DIAGNOSIS — Z8249 Family history of ischemic heart disease and other diseases of the circulatory system: Secondary | ICD-10-CM

## 2021-09-06 DIAGNOSIS — I10 Essential (primary) hypertension: Secondary | ICD-10-CM | POA: Diagnosis present

## 2021-09-06 DIAGNOSIS — Z87442 Personal history of urinary calculi: Secondary | ICD-10-CM

## 2021-09-06 DIAGNOSIS — E785 Hyperlipidemia, unspecified: Secondary | ICD-10-CM | POA: Diagnosis present

## 2021-09-06 DIAGNOSIS — I6522 Occlusion and stenosis of left carotid artery: Secondary | ICD-10-CM

## 2021-09-06 DIAGNOSIS — E119 Type 2 diabetes mellitus without complications: Secondary | ICD-10-CM | POA: Diagnosis present

## 2021-09-06 DIAGNOSIS — M545 Low back pain, unspecified: Secondary | ICD-10-CM | POA: Diagnosis present

## 2021-09-06 DIAGNOSIS — Z20822 Contact with and (suspected) exposure to covid-19: Secondary | ICD-10-CM | POA: Diagnosis present

## 2021-09-06 HISTORY — DX: Transient cerebral ischemic attack, unspecified: G45.9

## 2021-09-06 HISTORY — DX: Depression, unspecified: F32.A

## 2021-09-06 HISTORY — DX: Primary osteoarthritis, right ankle and foot: M19.071

## 2021-09-06 HISTORY — DX: Atherosclerosis of aorta: I70.0

## 2021-09-06 HISTORY — DX: Cardiac murmur, unspecified: R01.1

## 2021-09-06 HISTORY — DX: Type 2 diabetes mellitus without complications: E11.9

## 2021-09-06 HISTORY — PX: ENDARTERECTOMY: SHX5162

## 2021-09-06 HISTORY — DX: Deficiency of other specified B group vitamins: E53.8

## 2021-09-06 LAB — ABO/RH: ABO/RH(D): A POS

## 2021-09-06 LAB — MRSA NEXT GEN BY PCR, NASAL: MRSA by PCR Next Gen: NOT DETECTED

## 2021-09-06 LAB — GLUCOSE, CAPILLARY
Glucose-Capillary: 114 mg/dL — ABNORMAL HIGH (ref 70–99)
Glucose-Capillary: 121 mg/dL — ABNORMAL HIGH (ref 70–99)

## 2021-09-06 SURGERY — ENDARTERECTOMY, CAROTID
Anesthesia: General | Laterality: Left

## 2021-09-06 MED ORDER — HEPARIN SODIUM (PORCINE) 1000 UNIT/ML IJ SOLN
INTRAMUSCULAR | Status: DC | PRN
  Administered 2021-09-06: 6000 [IU] via INTRAVENOUS

## 2021-09-06 MED ORDER — LABETALOL HCL 5 MG/ML IV SOLN
10.0000 mg | INTRAVENOUS | Status: DC | PRN
  Filled 2021-09-06: qty 4

## 2021-09-06 MED ORDER — ACETAMINOPHEN 500 MG PO TABS: 1000.0000 mg | ORAL_TABLET | Freq: Four times a day (QID) | ORAL | Status: DC | PRN

## 2021-09-06 MED ORDER — PROPOFOL 10 MG/ML IV BOLUS
INTRAVENOUS | Status: DC | PRN
  Administered 2021-09-06: 200 mg via INTRAVENOUS

## 2021-09-06 MED ORDER — LACTATED RINGERS IV SOLN: INTRAVENOUS | Status: DC | PRN

## 2021-09-06 MED ORDER — CHLORHEXIDINE GLUCONATE 0.12 % MT SOLN: 15.0000 mL | Freq: Once | OROMUCOSAL | Status: AC

## 2021-09-06 MED ORDER — ONDANSETRON HCL 4 MG/2ML IJ SOLN: 4.0000 mg | Freq: Four times a day (QID) | INTRAMUSCULAR | Status: DC | PRN

## 2021-09-06 MED ORDER — IRBESARTAN 150 MG PO TABS
75.0000 mg | ORAL_TABLET | Freq: Every day | ORAL | Status: DC
  Administered 2021-09-07: 75 mg via ORAL
  Filled 2021-09-06: qty 1

## 2021-09-06 MED ORDER — ROSUVASTATIN CALCIUM 10 MG PO TABS
5.0000 mg | ORAL_TABLET | ORAL | Status: DC
  Filled 2021-09-06: qty 1

## 2021-09-06 MED ORDER — FIBRIN SEALANT 2 ML SINGLE DOSE KIT
PACK | CUTANEOUS | Status: DC | PRN
  Administered 2021-09-06: 4 mL via TOPICAL

## 2021-09-06 MED ORDER — GLYCOPYRROLATE 0.2 MG/ML IJ SOLN
INTRAMUSCULAR | Status: DC | PRN
Start: 2021-09-06 — End: 2021-09-06
  Administered 2021-09-06: .2 mg via INTRAVENOUS

## 2021-09-06 MED ORDER — ASPIRIN EC 81 MG PO TBEC
81.0000 mg | DELAYED_RELEASE_TABLET | Freq: Every day | ORAL | Status: DC
  Administered 2021-09-07: 81 mg via ORAL
  Filled 2021-09-06: qty 1

## 2021-09-06 MED ORDER — CHLORHEXIDINE GLUCONATE CLOTH 2 % EX PADS
6.0000 | MEDICATED_PAD | Freq: Every day | CUTANEOUS | Status: DC
  Administered 2021-09-07: 6 via TOPICAL

## 2021-09-06 MED ORDER — FENTANYL CITRATE (PF) 100 MCG/2ML IJ SOLN
INTRAMUSCULAR | Status: AC
  Administered 2021-09-06: 16:00:00 25 ug via INTRAVENOUS
  Filled 2021-09-06: qty 2

## 2021-09-06 MED ORDER — ACETAMINOPHEN 325 MG PO TABS: 325.0000 mg | ORAL_TABLET | ORAL | Status: DC | PRN

## 2021-09-06 MED ORDER — ORAL CARE MOUTH RINSE: 15.0000 mL | Freq: Once | OROMUCOSAL | Status: AC

## 2021-09-06 MED ORDER — ROCURONIUM BROMIDE 100 MG/10ML IV SOLN
INTRAVENOUS | Status: DC | PRN
Start: 2021-09-06 — End: 2021-09-06
  Administered 2021-09-06: 50 mg via INTRAVENOUS
  Administered 2021-09-06: 40 mg via INTRAVENOUS

## 2021-09-06 MED ORDER — HEMOSTATIC AGENTS (NO CHARGE) OPTIME
TOPICAL | Status: DC | PRN
  Administered 2021-09-06: 2 via TOPICAL

## 2021-09-06 MED ORDER — LABETALOL HCL 5 MG/ML IV SOLN
INTRAVENOUS | Status: DC | PRN
  Administered 2021-09-06 (×2): 5 mg via INTRAVENOUS
  Administered 2021-09-06: 10 mg via INTRAVENOUS

## 2021-09-06 MED ORDER — HYDROMORPHONE HCL 1 MG/ML IJ SOLN: 1.0000 mg | Freq: Once | INTRAMUSCULAR | Status: DC | PRN

## 2021-09-06 MED ORDER — LIDOCAINE HCL 1 % IJ SOLN
INTRAMUSCULAR | Status: DC | PRN
  Administered 2021-09-06: 5 mL

## 2021-09-06 MED ORDER — CEFAZOLIN SODIUM-DEXTROSE 2-4 GM/100ML-% IV SOLN
INTRAVENOUS | Status: AC
Start: 2021-09-06 — End: 2021-09-06
  Filled 2021-09-06: qty 100

## 2021-09-06 MED ORDER — OXYCODONE-ACETAMINOPHEN 5-325 MG PO TABS
1.0000 | ORAL_TABLET | ORAL | Status: DC | PRN
  Administered 2021-09-07: 1 via ORAL
  Filled 2021-09-06: qty 1

## 2021-09-06 MED ORDER — SODIUM CHLORIDE 0.9 % IV SOLN
INTRAVENOUS | Status: DC | PRN
  Administered 2021-09-06: 15:00:00 100 mL via INTRAMUSCULAR

## 2021-09-06 MED ORDER — FENTANYL CITRATE (PF) 100 MCG/2ML IJ SOLN
INTRAMUSCULAR | Status: AC
  Filled 2021-09-06: qty 2

## 2021-09-06 MED ORDER — METOPROLOL TARTRATE 5 MG/5ML IV SOLN: 2.0000 mg | INTRAVENOUS | Status: DC | PRN

## 2021-09-06 MED ORDER — FENTANYL CITRATE (PF) 100 MCG/2ML IJ SOLN
INTRAMUSCULAR | Status: DC | PRN
  Administered 2021-09-06 (×2): 100 ug via INTRAVENOUS

## 2021-09-06 MED ORDER — CHLORHEXIDINE GLUCONATE CLOTH 2 % EX PADS
6.0000 | MEDICATED_PAD | Freq: Once | CUTANEOUS | Status: DC
  Administered 2021-09-06: 18:00:00 6 via TOPICAL

## 2021-09-06 MED ORDER — ONDANSETRON HCL 4 MG/2ML IJ SOLN
INTRAMUSCULAR | Status: DC | PRN
  Administered 2021-09-06: 4 mg via INTRAVENOUS

## 2021-09-06 MED ORDER — FAMOTIDINE IN NACL 20-0.9 MG/50ML-% IV SOLN
20.0000 mg | Freq: Two times a day (BID) | INTRAVENOUS | Status: DC
  Administered 2021-09-06 – 2021-09-07 (×2): 20 mg via INTRAVENOUS
  Filled 2021-09-06 (×2): qty 50

## 2021-09-06 MED ORDER — ONDANSETRON HCL 4 MG/2ML IJ SOLN: 4.0000 mg | Freq: Once | INTRAMUSCULAR | Status: DC | PRN

## 2021-09-06 MED ORDER — PHENYLEPHRINE HCL-NACL 20-0.9 MG/250ML-% IV SOLN
INTRAVENOUS | Status: DC | PRN
  Administered 2021-09-06: 50 ug/min via INTRAVENOUS

## 2021-09-06 MED ORDER — CHLORHEXIDINE GLUCONATE 0.12 % MT SOLN
OROMUCOSAL | Status: AC
  Administered 2021-09-06: 12:00:00 15 mL via OROMUCOSAL
  Filled 2021-09-06: qty 15

## 2021-09-06 MED ORDER — FENTANYL CITRATE (PF) 100 MCG/2ML IJ SOLN
INTRAMUSCULAR | Status: AC
Start: 2021-09-06 — End: ?
  Filled 2021-09-06: qty 2

## 2021-09-06 MED ORDER — SODIUM CHLORIDE 0.9 % IV SOLN: INTRAVENOUS | Status: DC

## 2021-09-06 MED ORDER — CEFAZOLIN SODIUM-DEXTROSE 2-4 GM/100ML-% IV SOLN
2.0000 g | Freq: Three times a day (TID) | INTRAVENOUS | Status: AC
  Administered 2021-09-06 – 2021-09-07 (×2): 2 g via INTRAVENOUS
  Filled 2021-09-06 (×2): qty 100

## 2021-09-06 MED ORDER — CLOPIDOGREL BISULFATE 75 MG PO TABS
75.0000 mg | ORAL_TABLET | Freq: Every day | ORAL | Status: DC
  Administered 2021-09-07: 75 mg via ORAL
  Filled 2021-09-06: qty 1

## 2021-09-06 MED ORDER — CEFAZOLIN SODIUM-DEXTROSE 2-4 GM/100ML-% IV SOLN
2.0000 g | INTRAVENOUS | Status: AC
  Administered 2021-09-06: 14:00:00 2 g via INTRAVENOUS

## 2021-09-06 MED ORDER — LACTATED RINGERS IV SOLN: INTRAVENOUS | Status: DC

## 2021-09-06 MED ORDER — SODIUM CHLORIDE 0.9 % IV SOLN
500.0000 mL | Freq: Once | INTRAVENOUS | Status: DC | PRN
Start: 2021-09-06 — End: 2021-09-07

## 2021-09-06 MED ORDER — ESMOLOL HCL-SODIUM CHLORIDE 2000 MG/100ML IV SOLN
25.0000 ug/kg/min | INTRAVENOUS | Status: DC
  Filled 2021-09-06: qty 100

## 2021-09-06 MED ORDER — ONDANSETRON HCL 4 MG/2ML IJ SOLN
4.0000 mg | Freq: Four times a day (QID) | INTRAMUSCULAR | Status: DC | PRN
  Administered 2021-09-06: 18:00:00 4 mg via INTRAVENOUS
  Filled 2021-09-06: qty 2

## 2021-09-06 MED ORDER — IBUPROFEN 400 MG PO TABS: 600.0000 mg | ORAL_TABLET | Freq: Four times a day (QID) | ORAL | Status: DC | PRN

## 2021-09-06 MED ORDER — GUAIFENESIN-DM 100-10 MG/5ML PO SYRP: 15.0000 mL | ORAL_SOLUTION | ORAL | Status: DC | PRN

## 2021-09-06 MED ORDER — FENTANYL CITRATE (PF) 100 MCG/2ML IJ SOLN
25.0000 ug | INTRAMUSCULAR | Status: DC | PRN
  Administered 2021-09-06 (×3): 25 ug via INTRAVENOUS

## 2021-09-06 MED ORDER — MAGNESIUM SULFATE 2 GM/50ML IV SOLN
2.0000 g | Freq: Every day | INTRAVENOUS | Status: DC | PRN
  Filled 2021-09-06: qty 50

## 2021-09-06 MED ORDER — POTASSIUM CHLORIDE CRYS ER 20 MEQ PO TBCR: 20.0000 meq | EXTENDED_RELEASE_TABLET | Freq: Every day | ORAL | Status: DC | PRN

## 2021-09-06 MED ORDER — AMLODIPINE BESYLATE 5 MG PO TABS
2.5000 mg | ORAL_TABLET | Freq: Every day | ORAL | Status: DC
  Administered 2021-09-07: 2.5 mg via ORAL
  Filled 2021-09-06: qty 1

## 2021-09-06 MED ORDER — ACETAMINOPHEN 650 MG RE SUPP: 325.0000 mg | RECTAL | Status: DC | PRN

## 2021-09-06 MED ORDER — HYDRALAZINE HCL 20 MG/ML IJ SOLN: 5.0000 mg | INTRAMUSCULAR | Status: DC | PRN

## 2021-09-06 MED ORDER — NITROGLYCERIN IN D5W 200-5 MCG/ML-% IV SOLN: 5.0000 ug/min | INTRAVENOUS | Status: DC

## 2021-09-06 MED ORDER — PHENOL 1.4 % MT LIQD
1.0000 | OROMUCOSAL | Status: DC | PRN
  Filled 2021-09-06 (×2): qty 177

## 2021-09-06 MED ORDER — LIDOCAINE HCL (CARDIAC) PF 100 MG/5ML IV SOSY
PREFILLED_SYRINGE | INTRAVENOUS | Status: DC | PRN
  Administered 2021-09-06: 100 mg via INTRAVENOUS

## 2021-09-06 MED ORDER — 0.9 % SODIUM CHLORIDE (POUR BTL) OPTIME
TOPICAL | Status: DC | PRN
  Administered 2021-09-06: 15:00:00 500 mL

## 2021-09-06 MED ORDER — DOCUSATE SODIUM 100 MG PO CAPS
100.0000 mg | ORAL_CAPSULE | Freq: Every day | ORAL | Status: DC
  Administered 2021-09-07: 100 mg via ORAL
  Filled 2021-09-06: qty 1

## 2021-09-06 MED ORDER — HEPARIN SODIUM (PORCINE) 1000 UNIT/ML IJ SOLN
INTRAMUSCULAR | Status: AC
  Filled 2021-09-06: qty 10

## 2021-09-06 MED ORDER — PHENYLEPHRINE HCL-NACL 20-0.9 MG/250ML-% IV SOLN
INTRAVENOUS | Status: AC
  Filled 2021-09-06: qty 250

## 2021-09-06 MED ORDER — ALUM & MAG HYDROXIDE-SIMETH 200-200-20 MG/5ML PO SUSP: 15.0000 mL | ORAL | Status: DC | PRN

## 2021-09-06 MED ORDER — SUGAMMADEX SODIUM 500 MG/5ML IV SOLN
INTRAVENOUS | Status: DC | PRN
  Administered 2021-09-06: 300 mg via INTRAVENOUS

## 2021-09-06 MED ORDER — MORPHINE SULFATE (PF) 2 MG/ML IV SOLN
2.0000 mg | INTRAVENOUS | Status: DC | PRN
  Administered 2021-09-07: 2 mg via INTRAVENOUS
  Filled 2021-09-06: qty 1

## 2021-09-06 SURGICAL SUPPLY — 64 items
BAG DECANTER FOR FLEXI CONT (MISCELLANEOUS) ×2 IMPLANT
BLADE SURG 15 STRL LF DISP TIS (BLADE) ×1 IMPLANT
BLADE SURG 15 STRL SS (BLADE) ×1
BLADE SURG SZ11 CARB STEEL (BLADE) ×2 IMPLANT
BOOT SUTURE AID YELLOW STND (SUTURE) ×2 IMPLANT
BRUSH SCRUB EZ  4% CHG (MISCELLANEOUS) ×1
BRUSH SCRUB EZ 4% CHG (MISCELLANEOUS) ×1 IMPLANT
CHLORAPREP W/TINT 26 (MISCELLANEOUS) ×2 IMPLANT
DERMABOND ADVANCED (GAUZE/BANDAGES/DRESSINGS) ×1
DERMABOND ADVANCED .7 DNX12 (GAUZE/BANDAGES/DRESSINGS) ×1 IMPLANT
DRAPE 3/4 80X56 (DRAPES) ×2 IMPLANT
DRAPE INCISE IOBAN 66X45 STRL (DRAPES) ×2 IMPLANT
DRAPE LAPAROTOMY 77X122 PED (DRAPES) ×2 IMPLANT
DRSG OPSITE POSTOP 4X6 (GAUZE/BANDAGES/DRESSINGS) ×1 IMPLANT
ELECT CAUTERY BLADE 6.4 (BLADE) ×2 IMPLANT
ELECT REM PT RETURN 9FT ADLT (ELECTROSURGICAL) ×2
ELECTRODE REM PT RTRN 9FT ADLT (ELECTROSURGICAL) ×1 IMPLANT
GAUZE 4X4 16PLY ~~LOC~~+RFID DBL (SPONGE) ×2 IMPLANT
GLOVE SURG SYN 7.0 (GLOVE) ×4 IMPLANT
GLOVE SURG SYN 7.0 PF PI (GLOVE) ×2 IMPLANT
GOWN STRL REUS W/ TWL LRG LVL3 (GOWN DISPOSABLE) ×2 IMPLANT
GOWN STRL REUS W/ TWL XL LVL3 (GOWN DISPOSABLE) ×2 IMPLANT
GOWN STRL REUS W/TWL LRG LVL3 (GOWN DISPOSABLE) ×2
GOWN STRL REUS W/TWL XL LVL3 (GOWN DISPOSABLE) ×2
HEMOSTAT SURGICEL 2X3 (HEMOSTASIS) ×2 IMPLANT
IV NS 250ML (IV SOLUTION) ×1
IV NS 250ML BAXH (IV SOLUTION) ×1 IMPLANT
KIT TURNOVER KIT A (KITS) ×2 IMPLANT
LABEL OR SOLS (LABEL) ×2 IMPLANT
LOOP RED MAXI  1X406MM (MISCELLANEOUS) ×2
LOOP VESSEL MAXI 1X406 RED (MISCELLANEOUS) ×2 IMPLANT
LOOP VESSEL MINI 0.8X406 BLUE (MISCELLANEOUS) ×1 IMPLANT
LOOPS BLUE MINI 0.8X406MM (MISCELLANEOUS) ×1
MANIFOLD NEPTUNE II (INSTRUMENTS) ×2 IMPLANT
NDL FILTER BLUNT 18X1 1/2 (NEEDLE) ×1 IMPLANT
NDL HYPO 25X1 1.5 SAFETY (NEEDLE) ×1 IMPLANT
NEEDLE FILTER BLUNT 18X 1/2SAF (NEEDLE) ×1
NEEDLE FILTER BLUNT 18X1 1/2 (NEEDLE) ×1 IMPLANT
NEEDLE HYPO 25X1 1.5 SAFETY (NEEDLE) ×2 IMPLANT
NS IRRIG 500ML POUR BTL (IV SOLUTION) ×2 IMPLANT
PACK BASIN MAJOR ARMC (MISCELLANEOUS) ×2 IMPLANT
PATCH CAROTID ECM VASC 1X10 (Prosthesis & Implant Heart) ×2 IMPLANT
PENCIL ELECTRO HAND CTR (MISCELLANEOUS) IMPLANT
SET WALTER ACTIVATION W/DRAPE (SET/KITS/TRAYS/PACK) ×2 IMPLANT
SHUNT W TPORT 9FR PRUITT F3 (SHUNT) ×2 IMPLANT
SPONGE T-LAP 18X18 ~~LOC~~+RFID (SPONGE) ×4 IMPLANT
SUT MNCRL 4-0 (SUTURE) ×1
SUT MNCRL 4-0 27XMFL (SUTURE) ×1
SUT PROLENE 6 0 BV (SUTURE) ×8 IMPLANT
SUT PROLENE 7 0 BV 1 (SUTURE) ×4 IMPLANT
SUT SILK 2 0 (SUTURE) ×1
SUT SILK 2-0 18XBRD TIE 12 (SUTURE) ×1 IMPLANT
SUT SILK 3 0 (SUTURE) ×1
SUT SILK 3-0 18XBRD TIE 12 (SUTURE) ×1 IMPLANT
SUT SILK 4 0 (SUTURE) ×1
SUT SILK 4-0 18XBRD TIE 12 (SUTURE) ×1 IMPLANT
SUT VIC AB 3-0 SH 27 (SUTURE) ×2
SUT VIC AB 3-0 SH 27X BRD (SUTURE) ×2 IMPLANT
SUTURE MNCRL 4-0 27XMF (SUTURE) ×1 IMPLANT
SYR 10ML LL (SYRINGE) ×4 IMPLANT
SYR 20ML LL LF (SYRINGE) ×2 IMPLANT
TRAY FOLEY MTR SLVR 16FR STAT (SET/KITS/TRAYS/PACK) ×2 IMPLANT
TUBING CONNECTING 10 (TUBING) IMPLANT
WATER STERILE IRR 500ML POUR (IV SOLUTION) ×1 IMPLANT

## 2021-09-06 NOTE — Op Note (Signed)
[facility] ? ? ?OPERATIVE NOTE ? ?PROCEDURE:   ?1.  Left carotid endarterectomy with CorMatrix arterial patch reconstruction ? ?PRE-OPERATIVE DIAGNOSIS: 1.  High grade bilateral carotid stenosis ? ? ?POST-OPERATIVE DIAGNOSIS: same as above  ? ?SURGEON: Leotis Pain, MD ? ?ASSISTANT(S): none ? ?ANESTHESIA: general ? ?ESTIMATED BLOOD LOSS: 25 cc ? ?FINDING(S): ?1.  left carotid plaque. ? ?SPECIMEN(S):  Carotid plaque (sent to Pathology) ? ?INDICATIONS:   ?LEELAN RAJEWSKI is a 71 y.o. male who presents with high grade left carotid stenosis of >75%.  I discussed with the patient the risks, benefits, and alternatives to carotid endarterectomy.  I discussed the differences between carotid stenting and carotid endarterectomy. I discussed the procedural details of carotid endarterectomy with the patient.  The patient is aware that the risks of carotid endarterectomy include but are not limited to: bleeding, infection, stroke, myocardial infarction, death, cranial nerve injuries both temporary and permanent, neck hematoma, possible airway compromise, labile blood pressure post-operatively, cerebral hyperperfusion syndrome, and possible need for additional interventions in the future. The patient is aware of the risks and agrees to proceed forward with the procedure. ? ?DESCRIPTION: ?After full informed written consent was obtained from the patient, the patient was brought back to the operating room and placed supine upon the operating table.  Prior to induction, the patient received IV antibiotics.  After obtaining adequate anesthesia, the patient was placed into a modified beach chair position with a shoulder roll in place and the patient's neck slightly hyperextended and rotated away from the surgical site.  The patient was prepped in the standard fashion for a carotid endarterectomy.  I made an incision anterior to the sternocleidomastoid muscle and dissected down through the subcutaneous tissue.   The platysmas was opened with electrocautery.  Then I dissected down to the internal jugular vein and facial vein.  The facial vein is ligated and divided between 2-0 silk ties.  This was dissected posteriorly until I obtained visualization of the common carotid artery.  This was dissected out and then a vessel loop was placed around the common carotid artery.  I then dissected in a periadventitial fashion along the common carotid artery up to the bifurcation.  I then identified the external carotid artery and the superior thyroid artery.  I placed a vessel loop around the superior thyroid artery, and I also dissected out the external carotid artery and placed a vessel loop around it. In the process of this dissection, the hypoglossal nerve was identified and protected from harm.  I then dissected out the internal carotid artery until I identified an area in the internal carotid artery clearly above the stenosis.  I dissected slightly distal to this area, and placed a vessel loop around the artery.  At this point, we gave the patient 6000 units of intravenous heparin.  After this was allowed to circulate for several minutes, I pulled up control on the vessel loops to clamp the internal carotid artery, external carotid artery, superior thyroid artery, and then the common carotid artery.  I then made an arteriotomy in the common carotid artery with a 11 blade, and extended the arteriotomy with a Potts scissor down into the common carotid artery, then I carried the arteriotomy through the bifurcation into the internal carotid artery until I reached an area that was not diseased.  At this point, I took the [place] shunt that previously been prepared and I inserted it into the internal carotid artery first, and then into  the common carotid artery taking care to flush and de-air prior to release of control. At this point, I started the endarterectomy in the common carotid artery with a [place] elevator and carried  this dissection down into the common carotid artery circumferentially.  Then I transected the plaque at a segment where it was adherent and transected the plaque with Potts scissors.  I then carried this dissection up into the external carotid artery.  The plaque was extracted by unclamping the external carotid artery and performing an eversion endarterectomy.  The dissection was then carried into the internal carotid artery where a nice feathered end point was created with gentle traction.  I passed the plaque off the field as a specimen. At this point I removed all loose flecks and remaining disease possible.  At this point, I was satisfied that the minimal remaining disease was densely adherent to the wall and wall integrity was intact. The distal endpoint was tacked down with two 7-0 Prolene sutures.  I then fashioned a CorMatrix arterial patch for the artery and sewed it in place with two running stitch of 6-0 Prolene.  I started at the distal endpoint and ran one half the length of the arteriotomy.  I then cut and beveled the patch to an appropriate length to match the arteriotomy.  I started the second 6-0 Prolene at the proximal end point.  The medial suture line was completed and the lateral suture line was run approximately one quarter the length of the arteriotomy.  Prior to completing this patch angioplasty, I removed the shunt first from the internal carotid artery, from which there was excellent backbleeding, and clamped it.  Then I removed the shunt from the common carotid artery, from which there was excellent antegrade bleeding, and then clamped it.  At this point, I allowed the external carotid artery to backbleed, which was excellent.  Then I instilled heparinized saline in this patched artery and then completed the patch angioplasty in the usual fashion.  First, I released the clamp on the external carotid artery, then I released it on the common carotid artery.  After waiting a few seconds, I  then released it on the internal carotid artery. Several minutes of pressure were held and 6-0 Prolene patch sutures were used as need for hemostasis.  At this point, I placed Surgicel and Fibrillar topical hemostatic agents.  There was no more active bleeding in the surgical site.  The sternocleidomastoid space was closed with three interrupted 3-0 Vicryl sutures. I then reapproximated the platysma muscle with a running stitch of 3-0 Vicryl.  The skin was then closed with a running subcuticular 4-0 Monocryl.  The skin was then cleaned, dried and Dermabond was used to reinforce the skin closure.  The patient awakened and was taken to the recovery room in stable condition, following commands and moving all four extremities without any apparent deficits. ?   ?COMPLICATIONS: none ? ?CONDITION: stable ? ?Leotis Pain ? ?09/06/2021, 3:36 PM ? ? ? ?This note was created with Dragon Medical transcription system. Any errors in dictation are purely unintentional.  ?

## 2021-09-06 NOTE — Anesthesia Procedure Notes (Signed)
Procedure Name: Intubation ?Date/Time: 09/06/2021 1:49 PM ?Performed by: Beverely Low, CRNA ?Pre-anesthesia Checklist: Patient identified, Patient being monitored, Timeout performed, Emergency Drugs available and Suction available ?Patient Re-evaluated:Patient Re-evaluated prior to induction ?Oxygen Delivery Method: Circle system utilized ?Preoxygenation: Pre-oxygenation with 100% oxygen ?Induction Type: IV induction ?Ventilation: Mask ventilation without difficulty ?Laryngoscope Size: McGraph and 4 ?Grade View: Grade I ?Tube type: Oral ?Tube size: 7.5 mm ?Number of attempts: 1 ?Airway Equipment and Method: Stylet ?Placement Confirmation: ETT inserted through vocal cords under direct vision, positive ETCO2 and breath sounds checked- equal and bilateral ?Secured at: 21 cm ?Tube secured with: Tape ?Dental Injury: Teeth and Oropharynx as per pre-operative assessment  ? ? ? ? ?

## 2021-09-06 NOTE — Plan of Care (Signed)

## 2021-09-06 NOTE — Anesthesia Procedure Notes (Signed)
Arterial Line Insertion ?Start/End3/03/2022 1:50 PM, 09/06/2021 2:00 PM ?Performed by: [facility], Annica Marinello Canary, MD, Beverely Low, CRNA, CRNA ? Patient location: Pre-op. ?Preanesthetic checklist: patient identified, IV checked, site marked, risks and benefits discussed, surgical consent, monitors and equipment checked, pre-op evaluation, timeout performed and anesthesia consent ?Lidocaine 1% used for infiltration ?Left, radial was placed ?Catheter size: 20 G ?Hand hygiene performed  and maximum sterile barriers used  ? ?Attempts: 1 ?Procedure performed using ultrasound guided technique. ?Ultrasound Notes:anatomy identified, needle tip was noted to be adjacent to the nerve/plexus identified and no ultrasound evidence of intravascular and/or intraneural injection ?Following insertion, dressing applied. ?Post procedure assessment: normal and unchanged ? ? ? ?

## 2021-09-06 NOTE — H&P (Signed)
[facility] Admission History & Physical  MRN : [id number]  Nathan Fields is a 71 y.o. ([birth year]) male who presents with chief complaint of No chief complaint on file. Marland Kitchen  History of Present Illness: Patient presents today for carotid endarterectomy.  Has bilateral carotid stenosis left greater than right both high-grade.  Has progressed legs.  No new complaints today.  Current Facility-Administered Medications  Medication Dose Route Frequency Provider Last Rate Last Admin   ceFAZolin (ANCEF) 2-4 GM/100ML-% IVPB            ceFAZolin (ANCEF) IVPB 2g/100 mL premix  2 g Intravenous On Call to OR Georgiana Spinner, NP       Chlorhexidine Gluconate Cloth 2 % PADS 6 each  6 each Topical Once Georgiana Spinner, NP       And   Chlorhexidine Gluconate Cloth 2 % PADS 6 each  6 each Topical Once Georgiana Spinner, NP       lactated ringers infusion   Intravenous Continuous Piscitello, Cleda Mccreedy, MD 10 mL/hr at 09/06/21 1217 New Bag at 09/06/21 1217    Past Medical History:  Diagnosis Date   Anxiety    Aortic atherosclerosis (HCC)    Arthritis of right foot    B12 deficiency    Bilateral carotid artery stenosis 03/21/2021   a.) Carotid doppler 03/21/2021: 50-60% pRICA, 70-99% pLICA   CAD (coronary artery disease) 09/09/2007   a.) LHC 09/09/2007: EF 60%; 30% dLM, 25% pLAD, 25% mLAD, 40% oLCx, 50% pLCx, 30% pRI, 100% pRCA with good collateral flow; no interventions opting for med mgmt.   Chronic low back pain    Depression    Diastolic dysfunction 08/16/2021   a.)  TTE 08/16/2021: EF >55%; mild AR/MR/TR, trivial PR; G1DD.   GERD (gastroesophageal reflux disease)    History of kidney stones    Hyperlipidemia    Hypertension    Murmur    Plantar fasciitis    Pneumonia    Right thyroid nodule 06/15/2021   a.) CTA neck 06/15/2021: measured 7mm.   Syncope    T2DM (type 2 diabetes mellitus) (HCC)    TIA (transient ischemic attack)     Past Surgical History:   Procedure Laterality Date   CARDIAC CATHETERIZATION     COLONOSCOPY  2011   Polyp removed, follow up in 5 years   COLONOSCOPY WITH PROPOFOL N/A 01/14/2017   Procedure: COLONOSCOPY WITH PROPOFOL;  Surgeon: Wyline Mood, MD;  Location: [facility] ENDOSCOPY;  Service: Endoscopy;  Laterality: N/A;     Social History   Tobacco Use   Smoking status: Never   Smokeless tobacco: Never  Vaping Use   Vaping Use: Never used  Substance Use Topics   Alcohol use: Not Currently    Comment: on rare occasion   Drug use: Yes    Types: Marijuana     Family History  Problem Relation Age of Onset   Heart disease Mother    Hyperlipidemia Mother    Diabetes Mother    Congestive Heart Failure Mother    Hypertension Mother    Diabetes Father    Heart attack Father    Hyperlipidemia Father     Allergies  Allergen Reactions   Statins Other (See Comments)    fatigue fatigue   Zetia [Ezetimibe] Other (See Comments)    Constipation   Niacin Rash   Niacin And Related Rash     REVIEW OF SYSTEMS (Negative unless checked)  Constitutional: [] Weight loss  []   Fever  [] Chills Cardiac: [] Chest pain   [] Chest pressure   [] Palpitations   [] Shortness of breath when laying flat   [] Shortness of breath at rest   [] Shortness of breath with exertion. Vascular:  [] Pain in legs with walking   [] Pain in legs at rest   [] Pain in legs when laying flat   [] Claudication   [] Pain in feet when walking  [] Pain in feet at rest  [] Pain in feet when laying flat   [] History of DVT   [] Phlebitis   [] Swelling in legs   [] Varicose veins   [] Non-healing ulcers Pulmonary:   [] Uses home oxygen   [] Productive cough   [] Hemoptysis   [] Wheeze  [] COPD   [] Asthma Neurologic:  [x] Dizziness  [] Blackouts   [] Seizures   [] History of stroke   [x] History of TIA  [] Aphasia   [] Temporary blindness   [] Dysphagia   [] Weakness or numbness in arms   [] Weakness or numbness in legs Musculoskeletal:  [x] Arthritis   [] Joint swelling   [] Joint pain    [x] Low back pain Hematologic:  [] Easy bruising  [] Easy bleeding   [] Hypercoagulable state   [] Anemic  [] Hepatitis Gastrointestinal:  [] Blood in stool   [] Vomiting blood  [] Gastroesophageal reflux/heartburn   [] Difficulty swallowing. Genitourinary:  [] Chronic kidney disease   [] Difficult urination  [] Frequent urination  [] Burning with urination   [] Blood in urine Skin:  [] Rashes   [] Ulcers   [] Wounds Psychological:  [x] History of anxiety   []  History of major depression.  Physical Examination  Vitals:   09/06/21 1155  BP: (!) 165/83  Pulse: 70  Resp: 20  Temp: 98.2 F (36.8 C)  TempSrc: Oral  SpO2: 98%  Weight: 74.8 kg  Height: 5\' 6"  (1.676 m)   Body mass index is 26.63 kg/m. Gen: WD/WN, NAD Head: [place]/AT, No temporalis wasting. Ear/Nose/Throat: Hearing grossly intact, nares w/o erythema or drainage, oropharynx w/o Erythema/Exudate,  Eyes: Conjunctiva clear, sclera non-icteric Neck: Trachea midline.  No JVD.  Pulmonary:  Good air movement, respirations not labored, no use of accessory muscles.  Cardiac: RRR, normal S1, S2. Vascular:  Vessel Right Left  Radial Palpable Palpable           Musculoskeletal: M/S 5/5 throughout.  Extremities without ischemic changes.  No deformity or atrophy.  Neurologic: Sensation grossly intact in extremities.  Symmetrical.  Speech is fluent. Motor exam as listed above. Psychiatric: Judgment intact, Mood & affect appropriate for pt's clinical situation. Dermatologic: No rashes or ulcers noted.  No cellulitis or open wounds.      CBC Lab Results  Component Value Date   WBC 6.7 09/04/2021   HGB 14.8 09/04/2021   HCT 45.7 09/04/2021   MCV 91.0 09/04/2021   PLT 221 09/04/2021    BMET    Component Value Date/Time   NA 137 09/04/2021 0855   NA 140 08/02/2021 1515   NA 136 07/19/2012 0615   K 4.2 09/04/2021 0855   K 3.6 07/19/2012 0615   CL 104 09/04/2021 0855   CL 103 07/19/2012 0615   CO2 25 09/04/2021 0855   CO2 26 07/19/2012  0615   GLUCOSE 101 (H) 09/04/2021 0855   GLUCOSE 115 (H) 07/19/2012 0615   BUN 21 09/04/2021 0855   BUN 17 08/02/2021 1515   BUN 17 07/19/2012 0615   CREATININE 1.11 09/04/2021 0855   CREATININE 1.12 07/19/2012 0615   CALCIUM 9.3 09/04/2021 0855   CALCIUM 7.7 (L) 07/19/2012 0615   GFRNONAA >60 09/04/2021 0855   GFRNONAA >60 07/19/2012 1914  GFRAA 79 02/17/2020 1439   GFRAA >60 07/19/2012 0615   Estimated Creatinine Clearance: 56.7 mL/min (by C-G formula based on SCr of 1.11 mg/dL).  COAG No results found for: INR, PROTIME  Radiology CT HEAD WO CONTRAST ( )  Result Date: 08/08/2021 CLINICAL DATA:  Provided history: Vision loss, monocular, possible TIA. Additional history provided: Left-sided head pressure and vision loss. EXAM: CT HEAD WITHOUT CONTRAST TECHNIQUE: Contiguous axial images were obtained from the base of the skull through the vertex without intravenous contrast. RADIATION DOSE REDUCTION: This exam was performed according to the departmental dose-optimization program which includes automated exposure control, adjustment of the mA and/or kV according to patient size and/or use of iterative reconstruction technique. COMPARISON:  CT angiogram neck 06/15/2021. FINDINGS: Brain: Mild generalized cerebral atrophy. Small focus of hypodensity within the anterior left subinsular white matter, suspicious for a chronic lacunar infarct (series 2, image 10). There is no acute intracranial hemorrhage. No demarcated cortical infarct. No extra-axial fluid collection. No evidence of an intracranial mass. No midline shift. Vascular: No hyperdense vessel.  Atherosclerotic calcifications. Skull: Normal. Negative for fracture or focal lesion. Sinuses/Orbits: Visualized orbits show no acute finding. Trace mucosal thickening within the bilateral ethmoid sinuses. IMPRESSION: No evidence of acute intracranial abnormality. Small focus of hypodensity within the anterior left subinsular white matter,  suspicious for a chronic lacunar infarct. Mild generalized cerebral atrophy. Minimal mucosal thickening within the bilateral ethmoid sinuses. Electronically Signed   By: Jackey Loge D.O.   On: 08/08/2021 12:01     Assessment/Plan 1.  Bilateral carotid artery stenosis, left greater than right, high-grade. For left carotid endarterectomy today.  Likely a right carotid endarterectomy in 2 to 3 months.  Risks and benefits are discussed. 2. CAD.  Continue cardiac and antihypertensive medications as already ordered and reviewed, no changes at this time. Continue statin as ordered and reviewed, no changes at this time Nitrates PRN for chest pain  3. Hypertension. blood pressure control important in reducing the progression of atherosclerotic disease. On appropriate oral medications.    Festus Barren, MD  09/06/2021 1:03 PM

## 2021-09-06 NOTE — Anesthesia Preprocedure Evaluation (Addendum)
Anesthesia Evaluation  ?Patient identified by MRN, date of birth, ID band ?Patient awake ? ? ? ?Reviewed: ?Allergy & Precautions, NPO status , Patient's Chart, lab work & pertinent test results ? ?Airway ?Mallampati: II ? ?TM Distance: >3 FB ?Neck ROM: Full ? ? ? Dental ? ?(+) Teeth Intact ?  ?Pulmonary ?neg pulmonary ROS, Patient abstained from smoking.,  ?  ?Pulmonary exam normal ?breath sounds clear to auscultation ? ? ? ? ? ? Cardiovascular ?Exercise Tolerance: Good ?hypertension, Pt. on medications ?+ CAD  ?negative cardio ROS ?Normal cardiovascular exam ?Rhythm:Regular Rate:Normal ? ? ?  ?Neuro/Psych ?Anxiety Depression TIAnegative neurological ROS ? negative psych ROS  ? GI/Hepatic ?negative GI ROS, Neg liver ROS, GERD  ,  ?Endo/Other  ?negative endocrine ROSdiabetes, Type 2 ? Renal/GU ?  ? ?  ?Musculoskeletal ? ?(+) Arthritis ,  ? Abdominal ?Normal abdominal exam  (+)   ?Peds ?negative pediatric ROS ?(+)  Hematology ?negative hematology ROS ?(+)   ?Anesthesia Other Findings ?Past Medical History: ?No date: Anxiety ?No date: Aortic atherosclerosis ([place]) ?No date: Arthritis of right foot ?No date: B12 deficiency ?03/21/2021: Bilateral carotid artery stenosis ?    Comment:  a.) Carotid doppler 03/21/2021: 50-60% pRICA, 70-99%  ?             pLICA ?86/57/8469: CAD (coronary artery disease) ?    Comment:  a.) LHC 09/09/2007: EF 60%; 30% dLM, 25% pLAD, 25% mLAD, ?             40% oLCx, 50% pLCx, 30% pRI, 100% pRCA with good  ?             collateral flow; no interventions opting for med mgmt. ?No date: Chronic low back pain ?No date: Depression ?62/95/2841: Diastolic dysfunction ?    Comment:  a.)  TTE 08/16/2021: EF >55%; mild AR/MR/TR, trivial PR; ?             G1DD. ?No date: GERD (gastroesophageal reflux disease) ?No date: History of kidney stones ?No date: Hyperlipidemia ?No date: Hypertension ?No date: Murmur ?No date: Plantar fasciitis ?No date: Pneumonia ?06/15/2021: Right  thyroid nodule ?    Comment:  a.) CTA neck 06/15/2021: measured 48m. ?No date: Syncope ?No date: T2DM (type 2 diabetes mellitus) ([place] ?No date: TIA (transient ischemic attack) ? ?Past Surgical History: ?No date: CARDIAC CATHETERIZATION ?2011: COLONOSCOPY ?    Comment:  Polyp removed, follow up in 5 years ?01/14/2017: COLONOSCOPY WITH PROPOFOL; N/A ?    Comment:  Procedure: COLONOSCOPY WITH PROPOFOL;  Surgeon: AVicente Males  ?             KBailey Mech MD;  [place] [facility]  Service:  ?             Endoscopy;  Laterality: N/A; ? ?BMI   ? Body Mass Index: 26.63 kg/m?  ?  ? ? Reproductive/Obstetrics ?negative OB ROS ? ?  ? ? ? ? ? ? ? ? ? ? ? ? ? ?  ?  ? ? ? ? ? ? ? ? ?Anesthesia Physical ?Anesthesia Plan ? ?ASA: 3 ? ?Anesthesia Plan: General  ? ?Post-op Pain Management:   ? ?Induction: Intravenous ? ?PONV Risk Score and Plan: Ondansetron, Dexamethasone, Midazolam and Treatment may vary due to age or medical condition ? ?Airway Management Planned: Oral ETT ? ?Additional Equipment:  ? ?Intra-op Plan:  ? ?Post-operative Plan: Extubation in OR ? ?Informed Consent: I have reviewed the patients History and Physical, chart, labs and discussed the procedure  including the risks, benefits and alternatives for the proposed anesthesia with the patient or authorized representative who has indicated his/her understanding and acceptance.  ? ? ? ?Dental Advisory Given ? ?Plan Discussed with: CRNA and Surgeon ? ?Anesthesia Plan Comments:   ? ? ? ? ? ? ?Anesthesia Quick Evaluation ? ?

## 2021-09-06 NOTE — Transfer of Care (Signed)
Immediate Anesthesia Transfer of Care Note ? ?Patient: Nathan Fields ? ?Procedure(s) Performed: ENDARTERECTOMY CAROTID (Left) ? ?Patient Location: PACU ? ?Anesthesia Type:General ? ?Level of Consciousness: awake and sedated ? ?Airway & Oxygen Therapy: Patient Spontanous Breathing and Patient connected to face mask oxygen ? ?Post-op Assessment: Report given to RN and Post -op Vital signs reviewed and stable ? ?Post vital signs: Reviewed and stable ? ?Last Vitals:  ?Vitals Value Taken Time  ?BP 158/82 09/06/21 1545  ?Temp    ?Pulse 54 09/06/21 1553  ?Resp 9 09/06/21 1553  ?SpO2 100 % 09/06/21 1553  ?Vitals shown include unvalidated device data. ? ?Last Pain:  ?Vitals:  ? 09/06/21 1155  ?TempSrc: Oral  ?PainSc: 2   ?   ? ?  ? ?Complications: No notable events documented. ?

## 2021-09-07 ENCOUNTER — Encounter: Payer: Self-pay | Admitting: Vascular Surgery

## 2021-09-07 DIAGNOSIS — I6523 Occlusion and stenosis of bilateral carotid arteries: Principal | ICD-10-CM

## 2021-09-07 DIAGNOSIS — Z48812 Encounter for surgical aftercare following surgery on the circulatory system: Secondary | ICD-10-CM

## 2021-09-07 LAB — CBC
HCT: 38 % — ABNORMAL LOW (ref 39.0–52.0)
Hemoglobin: 12.2 g/dL — ABNORMAL LOW (ref 13.0–17.0)
MCH: 29.4 pg (ref 26.0–34.0)
MCHC: 32.1 g/dL (ref 30.0–36.0)
MCV: 91.6 fL (ref 80.0–100.0)
Platelets: 176 10*3/uL (ref 150–400)
RBC: 4.15 MIL/uL — ABNORMAL LOW (ref 4.22–5.81)
RDW: 13.1 % (ref 11.5–15.5)
WBC: 10.2 10*3/uL (ref 4.0–10.5)
nRBC: 0 % (ref 0.0–0.2)

## 2021-09-07 LAB — BASIC METABOLIC PANEL
Anion gap: 7 (ref 5–15)
BUN: 16 mg/dL (ref 8–23)
CO2: 23 mmol/L (ref 22–32)
Calcium: 8.4 mg/dL — ABNORMAL LOW (ref 8.9–10.3)
Chloride: 107 mmol/L (ref 98–111)
Creatinine, Ser: 1.09 mg/dL (ref 0.61–1.24)
GFR, Estimated: >60 mL/min (ref >60)
Glucose, Bld: 156 mg/dL — ABNORMAL HIGH (ref 70–99)
Potassium: 4.2 mmol/L (ref 3.5–5.1)
Sodium: 137 mmol/L (ref 135–145)

## 2021-09-07 LAB — MAGNESIUM: Magnesium: 1.8 mg/dL (ref 1.7–2.4)

## 2021-09-07 MED ORDER — OXYCODONE-ACETAMINOPHEN 5-325 MG PO TABS: 1.0000 | ORAL_TABLET | ORAL | 0 refills | Status: DC | PRN

## 2021-09-07 NOTE — Plan of Care (Signed)
?Problem: Education: ?Goal: Knowledge of General Education information will improve ?Description: Including pain rating scale, medication(s)/side effects and non-pharmacologic comfort measures ?09/07/2021 1229 by Neomia Glass, RN ?Outcome: Adequate for Discharge ?09/07/2021 1229 by Neomia Glass, RN ?Outcome: Adequate for Discharge ?09/07/2021 0955 by Neomia Glass, RN ?Outcome: Progressing ?  ?Problem: Health Behavior/Discharge Planning: ?Goal: Ability to manage health-related needs will improve ?09/07/2021 1229 by Neomia Glass, RN ?Outcome: Adequate for Discharge ?09/07/2021 1229 by Neomia Glass, RN ?Outcome: Adequate for Discharge ?09/07/2021 0955 by Neomia Glass, RN ?Outcome: Progressing ?  ?Problem: Clinical Measurements: ?Goal: Ability to maintain clinical measurements within normal limits will improve ?09/07/2021 1229 by Neomia Glass, RN ?Outcome: Adequate for Discharge ?09/07/2021 1229 by Neomia Glass, RN ?Outcome: Adequate for Discharge ?09/07/2021 0955 by Neomia Glass, RN ?Outcome: Progressing ?Goal: Will remain free from infection ?09/07/2021 1229 by Neomia Glass, RN ?Outcome: Adequate for Discharge ?09/07/2021 1229 by Neomia Glass, RN ?Outcome: Adequate for Discharge ?09/07/2021 0955 by Neomia Glass, RN ?Outcome: Progressing ?Goal: Diagnostic test results will improve ?09/07/2021 1229 by Neomia Glass, RN ?Outcome: Adequate for Discharge ?09/07/2021 1229 by Neomia Glass, RN ?Outcome: Adequate for Discharge ?09/07/2021 0955 by Neomia Glass, RN ?Outcome: Progressing ?Goal: Respiratory complications will improve ?09/07/2021 1229 by Neomia Glass, RN ?Outcome: Adequate for Discharge ?09/07/2021 1229 by Neomia Glass, RN ?Outcome: Adequate for Discharge ?09/07/2021 0955 by Neomia Glass, RN ?Outcome: Progressing ?Goal: Cardiovascular complication will be avoided ?09/07/2021 1229 by Neomia Glass, RN ?Outcome: Adequate for Discharge ?09/07/2021 1229 by Neomia Glass,  RN ?Outcome: Adequate for Discharge ?09/07/2021 0955 by Neomia Glass, RN ?Outcome: Progressing ?  ?Problem: Activity: ?Goal: Risk for activity intolerance will decrease ?09/07/2021 1229 by Neomia Glass, RN ?Outcome: Adequate for Discharge ?09/07/2021 1229 by Neomia Glass, RN ?Outcome: Adequate for Discharge ?09/07/2021 0955 by Neomia Glass, RN ?Outcome: Progressing ?  ?Problem: Nutrition: ?Goal: Adequate nutrition will be maintained ?09/07/2021 1229 by Neomia Glass, RN ?Outcome: Adequate for Discharge ?09/07/2021 1229 by Neomia Glass, RN ?Outcome: Adequate for Discharge ?09/07/2021 0955 by Neomia Glass, RN ?Outcome: Progressing ?  ?Problem: Coping: ?Goal: Level of anxiety will decrease ?09/07/2021 1229 by Neomia Glass, RN ?Outcome: Adequate for Discharge ?09/07/2021 1229 by Neomia Glass, RN ?Outcome: Adequate for Discharge ?09/07/2021 0955 by Neomia Glass, RN ?Outcome: Progressing ?  ?Problem: Elimination: ?Goal: Will not experience complications related to bowel motility ?09/07/2021 1229 by Neomia Glass, RN ?Outcome: Adequate for Discharge ?09/07/2021 1229 by Neomia Glass, RN ?Outcome: Adequate for Discharge ?09/07/2021 0955 by Neomia Glass, RN ?Outcome: Progressing ?Goal: Will not experience complications related to urinary retention ?09/07/2021 1229 by Neomia Glass, RN ?Outcome: Adequate for Discharge ?09/07/2021 1229 by Neomia Glass, RN ?Outcome: Adequate for Discharge ?09/07/2021 0955 by Neomia Glass, RN ?Outcome: Progressing ?  ?Problem: Pain Managment: ?Goal: General experience of comfort will improve ?09/07/2021 1229 by Neomia Glass, RN ?Outcome: Adequate for Discharge ?09/07/2021 1229 by Neomia Glass, RN ?Outcome: Adequate for Discharge ?09/07/2021 0955 by Neomia Glass, RN ?Outcome: Progressing ?  ?Problem: Safety: ?Goal: Ability to remain free from injury will improve ?09/07/2021 1229 by Neomia Glass, RN ?Outcome: Adequate for Discharge ?09/07/2021 1229 by Neomia Glass, RN ?Outcome: Adequate for Discharge ?09/07/2021 0955 by Neomia Glass, RN ?Outcome: Progressing ?  ?Problem: Skin Integrity: ?Goal: Risk for impaired skin integrity will  decrease ?09/07/2021 1229 by Neomia Glass, RN ?Outcome: Adequate for Discharge ?09/07/2021 1229 by Neomia Glass, RN ?Outcome: Adequate for Discharge ?09/07/2021 0955 by Neomia Glass, RN ?Outcome: Progressing ?  ?

## 2021-09-07 NOTE — Progress Notes (Addendum)
0730 patient sleeping in bed vitals WNL ?0830 breakfast ordered for patient foley removed and external male cath placed per patient request, education on throat soreness, ambulation and urination completed ?1000 patient up to chair after dangling at bedside all meds taken PO. Patient couldn't eat hard boiled eggs he wanted for breakfast, education on soft foods that are easier to swallow reviewed again with patient.  ?1100 patient walked in hall 2 labs around nursing unit tolerated well no complications noted. ?

## 2021-09-07 NOTE — Progress Notes (Signed)
DISCHARGE ? ?1254 patient going home with wife all discharge instructions reviewed with patient. IV removed assisted patient with getting dressed. Waiting on wife to arrive in car to take patient home at this time ? ?

## 2021-09-07 NOTE — Discharge Summary (Signed)
?Nathan Fields    ?Discharge Summary ? ? ? ?Patient ID:  ?Nathan Fields ?MRN: [id number] ?DOB/AGE: 02-07-1951 71 y.o. ? ?Admit date: 09/06/2021 ?Discharge date: 09/07/2021 ?Date of Surgery: 09/06/2021 ?Surgeon: Surgeon(s): ?Algernon Huxley, MD ? ?Admission Diagnosis: ?Carotid stenosis, asymptomatic, bilateral [I65.23] ? ?Discharge Diagnoses:  ?Carotid stenosis, asymptomatic, bilateral [I65.23] ? ?Secondary Diagnoses: ?Past Medical History:  ?Diagnosis Date  ? Anxiety   ? Aortic atherosclerosis ([place])   ? Arthritis of right foot   ? B12 deficiency   ? Bilateral carotid artery stenosis 03/21/2021  ? a.) Carotid doppler 03/21/2021: 30-09% pRICA, 23-30% pLICA  ? CAD (coronary artery disease) 09/09/2007  ? a.) LHC 09/09/2007: EF 60%; 30% dLM, 25% pLAD, 25% mLAD, 40% oLCx, 50% pLCx, 30% pRI, 100% pRCA with good collateral flow; no interventions opting for med mgmt.  ? Chronic low back pain   ? Depression   ? Diastolic dysfunction 07/62/2633  ? a.)  TTE 08/16/2021: EF >55%; mild AR/MR/TR, trivial PR; G1DD.  ? GERD (gastroesophageal reflux disease)   ? History of kidney stones   ? Hyperlipidemia   ? Hypertension   ? Murmur   ? Plantar fasciitis   ? Pneumonia   ? Right thyroid nodule 06/15/2021  ? a.) CTA neck 06/15/2021: measured 42m.  ? Syncope   ? T2DM (type 2 diabetes mellitus) ([place]   ? TIA (transient ischemic attack)   ? ? ?Procedure(s): ?ENDARTERECTOMY CAROTID ? ?Discharged Condition: good ? ?HPI:  ?Nathan Niznikis a 71year old male who recently underwent left carotid endarterectomy on 09/06/2021 following a high-grade stenosis.  The patient still continues to have a high-grade right carotid stenosis.  Following surgery the patient is doing well with very minimal swelling. ? ?Hospital Course:  ?HSIRRON FRANCESCONIis a 71y.o. male is S/P Left Carotid Endarterectomy  ?Procedure(s): ?ENDARTERECTOMY CAROTID ?Extubated: POD # 0 ?Physical exam: Wound is clean dry and intact.  Minimal bruising and  swelling. ?Post-op wounds clean, dry, intact or healing well ?Pt. Ambulating, voiding and taking PO diet without difficulty. ?Pt pain controlled with PO pain meds. ?Labs as below ?Complications:none ? ?Consults:  ? ? ?Significant Diagnostic Studies: ?CBC ?Lab Results  ?Component Value Date  ? WBC 10.2 09/07/2021  ? HGB 12.2 (L) 09/07/2021  ? HCT 38.0 (L) 09/07/2021  ? MCV 91.6 09/07/2021  ? PLT 176 09/07/2021  ? ? ?BMET ?   ?Component Value Date/Time  ? NA 137 09/07/2021 0405  ? NA 140 08/02/2021 1515  ? NA 136 07/19/2012 0615  ? K 4.2 09/07/2021 0405  ? K 3.6 07/19/2012 0615  ? CL 107 09/07/2021 0405  ? CL 103 07/19/2012 0615  ? CO2 23 09/07/2021 0405  ? CO2 26 07/19/2012 0615  ? GLUCOSE 156 (H) 09/07/2021 0405  ? GLUCOSE 115 (H) 07/19/2012 0615  ? BUN 16 09/07/2021 0405  ? BUN 17 08/02/2021 1515  ? BUN 17 07/19/2012 0615  ? CREATININE 1.09 09/07/2021 0405  ? CREATININE 1.12 07/19/2012 0615  ? CALCIUM 8.4 (L) 09/07/2021 0405  ? CALCIUM 7.7 (L) 07/19/2012 0615  ? GFRNONAA >60 09/07/2021 0405  ? GFRNONAA >60 07/19/2012 0615  ? GFRAA 79 02/17/2020 1439  ? GFRAA >60 07/19/2012 0615  ? ?COAG ?No results found for: INR, PROTIME ? ? ?Disposition:  ?Discharge to :Home ? ?Allergies as of 09/07/2021   ? ?   Reactions  ? Statins Other (See Comments)  ? fatigue ?fatigue  ? Zetia [ezetimibe] Other (See Comments)  ?  Constipation  ? Niacin Rash  ? Niacin And Related Rash  ? ?  ? ?  ?Medication List  ?  ? ?TAKE these medications   ? ?acetaminophen 500 MG tablet ?Commonly known as: TYLENOL ?Take 1,000 mg by mouth every 6 (six) hours as needed. ?  ?amLODipine 2.5 MG tablet ?Commonly known as: NORVASC ?Take 1 tablet (2.5 mg total) by mouth daily. ?  ?aspirin 81 MG tablet ?Take 81 mg by mouth daily. ?  ?ibuprofen 600 MG tablet ?Commonly known as: ADVIL ?Take 600 mg by mouth every 6 (six) hours as needed. ?  ?oxyCODONE-acetaminophen 5-325 MG tablet ?Commonly known as: PERCOCET/ROXICET ?Take 1-2 tablets by mouth every 4 (four) hours as  needed for moderate pain. ?  ?rosuvastatin 5 MG tablet ?Commonly known as: CRESTOR ?Take 1 tablet (5 mg total) by mouth every other day. ?  ?valsartan 40 MG tablet ?Commonly known as: DIOVAN ?Take 1 tablet (40 mg total) by mouth daily. ?  ? ?  ? ?Verbal and written Discharge instructions given to the patient. Wound care per Discharge AVS ? Follow-up Information   ? ? Kris Hartmann, NP Follow up in 1 month(s).   ?Specialty: Vascular Surgery ?Why: JD or FB with Carotid Duplex ?Contact information: ?[address] ?[place] [address] ?[phone] ? ? ?  ?  ? ?  ?  ? ?  ? ? ?Signed: ?Kris Hartmann, NP ? ?09/07/2021, 11:24 AM ? ? ?  ? ?

## 2021-09-07 NOTE — Anesthesia Postprocedure Evaluation (Signed)
Anesthesia Post Note ? ?Patient: Nathan Fields ? ?Procedure(s) Performed: ENDARTERECTOMY CAROTID (Left) ? ?Patient location during evaluation: ICU ?Anesthesia Type: General ?Level of consciousness: awake and alert, awake and oriented ?Pain management: pain level controlled ?Vital Signs Assessment: post-procedure vital signs reviewed and stable ?Respiratory status: patient remains intubated per anesthesia plan and spontaneous breathing ?Cardiovascular status: stable ?Postop Assessment: no apparent nausea or vomiting ?Anesthetic complications: no ? ? ?No notable events documented. ? ? ?Last Vitals:  ?Vitals:  ? 09/07/21 0700 09/07/21 0716  ?BP: (!) 142/73   ?Pulse: (!) 59 (!) 55  ?Resp: 13 18  ?Temp:    ?SpO2: 95% 94%  ?  ?Last Pain:  ?Vitals:  ? 09/07/21 0716  ?TempSrc:   ?PainSc: Asleep  ? ? ?  ?  ?  ?  ?  ?  ? ?Doreen Salvage A ? ? ? ? ?

## 2021-09-07 NOTE — Plan of Care (Signed)

## 2021-09-07 NOTE — Progress Notes (Signed)
?  Transition of Care (TOC) Screening Note ? ? ?Patient Details  ?Name: Nathan Fields ?Date of Birth: [birth year] ? ? ?Transition of Care (TOC) CM/SW Contact:    ?Shelbie Hutching, RN ?Phone Number: ?09/07/2021, 11:25 AM ? ? ? ?Transition of Care Department [facility]) has reviewed patient and no TOC needs have been identified at this time. We will continue to monitor patient advancement through interdisciplinary progression rounds. If new patient transition needs arise, please place a TOC consult. ?  ?

## 2021-09-10 ENCOUNTER — Telehealth: Payer: Self-pay | Admitting: *Deleted

## 2021-09-10 LAB — SURGICAL PATHOLOGY

## 2021-09-10 NOTE — Telephone Encounter (Signed)
Transition Care Management Unsuccessful Follow-up Telephone Call  Date of discharge and from where:  [facility]  Attempts:  1st Attempt  Reason for unsuccessful TCM follow-up call:  Left voice message    

## 2021-09-12 ENCOUNTER — Encounter: Payer: Self-pay | Admitting: Family Medicine

## 2021-09-12 NOTE — Telephone Encounter (Signed)
Transition Care Management Follow-up Telephone Call ?Date of discharge and from where: [facility]  3-10+2023 ?How have you been since you were released from the hospital? Per wife patient sore but doing ok ?Any questions or concerns? No ? ?Items Reviewed: ?Did the pt receive and understand the discharge instructions provided? Yes  ?Medications obtained and verified? Yes  ?Other? No  ?Any new allergies since your discharge? No  ?Dietary orders reviewed? Yes ?Do you have support at home? Yes  ? ?Home Care and Equipment/Supplies: ?Were home health services ordered? not applicable ?If so, what is the name of the agency?   ?Has the agency set up a time to come to the patient's home? not applicable ?Were any new equipment or medical supplies ordered?  No ?What is the name of the medical supply agency?  ?Were you able to get the supplies/equipment? not applicable ?Do you have any questions related to the use of the equipment or supplies? No ? ?Functional Questionnaire: (I = Independent and D = Dependent) ?ADLs: I ? ?Bathing/Dressing- I ? ?Meal Prep- I ? ?Eating- I ? ?Maintaining continence- I ? ?Transferring/Ambulation- I ? ?Managing Meds- I ? ?Follow up appointments reviewed: ? ?PCP Hospital f/u appt confirmed? No  Specialist Hospital f/u appt confirmed? Yes   ?Are transportation arrangements needed? No  ?If their condition worsens, is the pt aware to call PCP or go to the Emergency Dept.? Yes ?Was the patient provided with contact information for the PCP's office or ED? No ?Was to pt encouraged to call back with questions or concerns? No  ?

## 2021-09-13 MED ORDER — ROSUVASTATIN CALCIUM 5 MG PO TABS: 5.0000 mg | ORAL_TABLET | ORAL | 1 refills | Status: DC

## 2021-09-19 ENCOUNTER — Telehealth: Payer: Medicare Other | Admitting: Physician Assistant

## 2021-09-19 DIAGNOSIS — K121 Other forms of stomatitis: Secondary | ICD-10-CM | POA: Diagnosis not present

## 2021-09-19 MED ORDER — CHLORHEXIDINE GLUCONATE 0.12 % MT SOLN: 15.0000 mL | Freq: Two times a day (BID) | OROMUCOSAL | 0 refills | Status: DC

## 2021-09-19 NOTE — Progress Notes (Signed)
E-Visit for Mouth Ulcers ? ?We are sorry that you are not feeling well.  Here is how we plan to help! ? ?Based on what you have shared with me, it appears that you do have mouth ulcer(s). ?    ?The following medications should decrease the discomfort and help with healing. ?Biotene mouthwash 3 times daily (Available over the counter), Orajel (Available over the counter), Multivitamin daily, and I have prescribed Chlorhexidine mouthwash daily for 3 days  ? ?Mouth ulcers are painful areas in the mouth and gums. These are also known as ?canker sores?.  They can occur anywhere inside the mouth. While mostly harmless, mouth ulcers can be extremely uncomfortable and may make it difficult to eat, drink, and brush your teeth.  You may have more than 1 ulcer and they can vary and change in size. ?Mouth ulcers are not contagious and should not be confused with cold sores.  Cold sores appear on the lip or around the outside of the mouth and often begin with a tingling, burning or itching sensation.  ? ?While the exact causes are unknown, some common causes and factors that may aggravate mouth ulcers include: ?Genetics - Sometimes mouth ulcers run in families ?High alcohol intake ?Acidic foods such as citrus fruits like pineapple, grapefruit, orange fruits/juices, may aggravate mouth ulcers ?Other foods high in acidity or spice such as coffee, chocolate, chips, pretzels, eggs, nuts, cheese ?Quitting smoking ?Injury caused by biting the tongue or inside of the cheek ?Diet lacking in L-24, zinc, folic acid or iron ?Male hormone shifts with menstruation ?Excessive fatigue, emotional stress or anxiety ?Prevention: ?Talk to your doctor if you are taking meds that are known to cause mouth ulcers such as:   Anti-inflammatory drugs (for example Ibuprofen, Naproxen sodium), pain killers, Beta blockers, Oral nicotine replacement drugs, Some street drugs (heroin).   ?Avoid allowing any tablets to dissolve in your mouth that are meant to  swallowed whole ?Avoid foods/drinks that trigger or worsen symptoms ?Keep your mouth clean with daily brushing and flossing ? ?Home Care: ?The goal with treatment is to ease the pain where ulcers occur and help them heal as quickly as possible.  There is no medical treatment to prevent mouth ulcers from coming back or recurring.  ?Avoid spicy and acidic foods ?Eat soft foods and avoid rough, crunchy foods ?Avoid chewing gum ?Do not use toothpaste that contains sodium lauryl sulphite ?Use a straw to drink which helps avoid liquids toughing the ulcers near the front of your mouth ?Use a very soft toothbrush ?If you have dentures or dental hardware that you feel is not fitting well or contributing to his, please see your dentist. ?Use saltwater mouthwash which helps healing. Dissolve a ? teaspoon of salt in a glass of warm water. Swish around your mouth and spit it out. This can be used as needed if it is soothing.  ? ?GET HELP RIGHT AWAY IF: ?Persistent ulcers require checking IN PERSON (face to face). Any mouth lesion lasting longer than a month should be seen by your DENTIST as soon as possible for evaluation for possible oral cancer. ?If you have a non-painful ulcer in 1 or more areas of your mouth ?Ulcers that are spreading, are very large or particularly painful ?Ulcers last longer than one week without improving on treatment ?If you develop a fever, swollen glands and begin to feel unwell ?Ulcers that developed after starting a new medication ?MAKE SURE YOU: ?Understand these instructions. ?Will watch your condition. ?Will get  help right away if you are not doing well or get worse. ? ?Thank you for choosing an e-visit. ? ?Your e-visit answers were reviewed by a board certified advanced clinical practitioner to complete your personal care plan. Depending upon the condition, your plan could have included both over the counter or prescription medications. ? ?Please review your pharmacy choice. Make sure the  pharmacy is open so you can pick up prescription now. If there is a problem, you may contact your provider through [employer] and have the prescription routed to another pharmacy.  Your safety is important to [place]. If you have drug allergies check your prescription carefully.  ? ?For the next 24 hours you can use MyChart to ask questions about today's visit, request a non-urgent call back, or ask for a work or school excuse. ?You will get an email in the next two days asking about your experience. I hope that your e-visit has been valuable and will speed your recovery. ? ?

## 2021-09-19 NOTE — Progress Notes (Signed)
I have spent 5 minutes in review of e-visit questionnaire, review and updating patient chart, medical decision making and response to patient.   Virgen Belland Cody Adeli Frost, PA-C    

## 2021-09-21 ENCOUNTER — Other Ambulatory Visit (INDEPENDENT_AMBULATORY_CARE_PROVIDER_SITE_OTHER): Payer: Self-pay | Admitting: Nurse Practitioner

## 2021-09-21 ENCOUNTER — Telehealth (INDEPENDENT_AMBULATORY_CARE_PROVIDER_SITE_OTHER): Payer: Self-pay | Admitting: Nurse Practitioner

## 2021-09-21 ENCOUNTER — Encounter (INDEPENDENT_AMBULATORY_CARE_PROVIDER_SITE_OTHER): Payer: Self-pay

## 2021-09-21 ENCOUNTER — Telehealth: Payer: 59 | Admitting: Physician Assistant

## 2021-09-21 DIAGNOSIS — K121 Other forms of stomatitis: Secondary | ICD-10-CM

## 2021-09-21 NOTE — Progress Notes (Signed)
Based on what you shared with me, I feel your condition warrants further evaluation and I recommend that you be seen in a face to face visit. ? ?If you are failing the 1st line treatment, we do highly recommend for you to be evaluated in person for appropriate treatment. ?  ?NOTE: There will be NO CHARGE for this eVisit ?  ?If you are having a true medical emergency please call 911.   ?  ? For an urgent face to face visit, [facility] has six urgent care centers for your convenience:  ?  ? [facility] [place] ?Get Driving Directions ?[phone] ?[address] ?[place], [place] [address] ?  ? [facility]) ?Get Driving Directions ?[phone] ?[address] ?[place], [place] [address] ? ?[facility] [place] [facility]) ?Get Driving Directions ?[address],  [place]  [address] ? ?[facility] ?Get Driving Directions ?[phone] ?[address] ?[place], [place] [address] ?  ?[facility] [place] ?Get Driving Directions  ?[phone] ?[address] ?[address] ?[place], [place] [address] ?  ?[facility] ?Get Driving Directions ?[phone] ?[address] ?[place],  [address] ? ?Your MyChart E-visit questionnaire answers were reviewed by a board certified advanced clinical practitioner to complete your personal care plan based on your specific symptoms.  Thank you for using e-Visits. ?  ?I provided 5 minutes of non face-to-face time during this encounter for chart review and documentation.  ? ? ?

## 2021-09-21 NOTE — Telephone Encounter (Signed)
Pt. Wife called and stated pt. Has soars in his mouth since his surgery and said his PCP gave him some mouth wash for it but it does not work.  She asked if Ms. Owens Shark could give him something for the soars in his mouth.  She states he is unable to eat.  Please advise ?

## 2021-09-21 NOTE — Telephone Encounter (Signed)
Let's call him in a prednisone pack

## 2021-09-21 NOTE — Progress Notes (Unsigned)
pred 

## 2021-09-21 NOTE — Telephone Encounter (Signed)
Prescription has been called to [facility] in [place] and I left a message on patient spouse voicemail ?

## 2021-10-15 ENCOUNTER — Telehealth (INDEPENDENT_AMBULATORY_CARE_PROVIDER_SITE_OTHER): Payer: Self-pay | Admitting: Nurse Practitioner

## 2021-10-15 ENCOUNTER — Other Ambulatory Visit (INDEPENDENT_AMBULATORY_CARE_PROVIDER_SITE_OTHER): Payer: Self-pay | Admitting: Vascular Surgery

## 2021-10-15 DIAGNOSIS — I6522 Occlusion and stenosis of left carotid artery: Secondary | ICD-10-CM

## 2021-10-15 DIAGNOSIS — Z9889 Other specified postprocedural states: Secondary | ICD-10-CM

## 2021-10-15 NOTE — Telephone Encounter (Signed)
LVM for pt on cell and home TCB and see if pt can come in at 2:00 instead of 2:30 per Brent. Nothing further needed at this time. ?

## 2021-10-16 ENCOUNTER — Ambulatory Visit (INDEPENDENT_AMBULATORY_CARE_PROVIDER_SITE_OTHER): Payer: Medicare Other

## 2021-10-16 ENCOUNTER — Ambulatory Visit (INDEPENDENT_AMBULATORY_CARE_PROVIDER_SITE_OTHER): Payer: Medicare Other | Admitting: Nurse Practitioner

## 2021-10-16 ENCOUNTER — Encounter (INDEPENDENT_AMBULATORY_CARE_PROVIDER_SITE_OTHER): Payer: Self-pay | Admitting: Nurse Practitioner

## 2021-10-16 VITALS — BP 171/74 | HR 67 | Resp 16 | Ht 66.0 in | Wt 150.0 lb

## 2021-10-16 DIAGNOSIS — I6522 Occlusion and stenosis of left carotid artery: Secondary | ICD-10-CM | POA: Diagnosis not present

## 2021-10-16 DIAGNOSIS — Z9889 Other specified postprocedural states: Secondary | ICD-10-CM

## 2021-10-16 DIAGNOSIS — I6521 Occlusion and stenosis of right carotid artery: Secondary | ICD-10-CM

## 2021-10-16 DIAGNOSIS — R4182 Altered mental status, unspecified: Secondary | ICD-10-CM

## 2021-10-22 ENCOUNTER — Encounter (INDEPENDENT_AMBULATORY_CARE_PROVIDER_SITE_OTHER): Payer: Self-pay

## 2021-10-23 ENCOUNTER — Encounter (INDEPENDENT_AMBULATORY_CARE_PROVIDER_SITE_OTHER): Payer: Self-pay | Admitting: Nurse Practitioner

## 2021-10-23 NOTE — Progress Notes (Signed)
? ?Subjective:  ? ? Patient ID: Nathan Fields, male    DOB: 02/18/1951, 71 y.o.   MRN: [id number] ?Chief Complaint  ?Patient presents with  ? Follow-up  ?  ultrasoumd  ? ? ?The patient is seen for follow up evaluation of carotid stenosis status post right carotid endarterectomy on 09/05/2021.  Initially the patient had bumps in his mouth and was prescribed a mouthwash by his PCP and was given steroids by our office.  He notes that the steroids had undesired side effect so he stopped taking those.  Following the surgery the patient notes that he has been having issues with biting his tongue on the left side.  He notes that he sometimes has issues with drooling and he feels that he has been having some issues with mental status changes such as abrupt changes in attitude or temperament.  His wife is very concerned about this.  The patient denies neck or incisional pain. ? ?The patient denies interval amaurosis fugax. There is a recent history of TIA symptoms without focal motor deficits. There is no prior documented CVA. ? ?The patient denies headache. ? ?The patient is taking enteric-coated aspirin 81 mg daily. ? ?No recent shortening of the patient's walking distance or new symptoms consistent with claudication.  No history of rest pain symptoms. No new ulcers or wounds of the lower extremities have occurred. ? ?There is no history of DVT, PE or superficial thrombophlebitis. ?No recent episodes of angina or shortness of breath documented.  ?  ? ?The patient has a 1 to 39% noted stenosis bilaterally in the ICAs.  Antegrade flow in the bilateral vertebral arteries  ? ? ?Review of Systems  ?Neurological:  Positive for numbness. Negative for facial asymmetry and weakness.  ?All other systems reviewed and are negative. ? ?   ?Objective:  ? Physical Exam ?Vitals reviewed.  ?HENT:  ?   Head: Normocephalic.  ?Eyes:  ?   General: No visual field deficit. ?Cardiovascular:  ?   Rate and Rhythm: Normal rate.  ?   Pulses: Normal  pulses.  ?Pulmonary:  ?   Effort: Pulmonary effort is normal.  ?Skin: ?   General: Skin is warm and dry.  ?Neurological:  ?   Mental Status: He is alert and oriented to person, place, and time.  ?   Cranial Nerves: No dysarthria or facial asymmetry.  ?   Deep Tendon Reflexes: Reflexes are normal and symmetric.  ?Psychiatric:     ?   Mood and Affect: Mood normal.     ?   Behavior: Behavior normal.     ?   Thought Content: Thought content normal.     ?   Judgment: Judgment normal.  ? ? ?BP (!) 171/74 (BP Location: Left Arm)   Pulse 67   Resp 16   Ht '5\' 6"'$  (1.676 m)   Wt 150 lb (68 kg)   BMI 24.21 kg/m?  ? ?Past Medical History:  ?Diagnosis Date  ? Anxiety   ? Aortic atherosclerosis ([place])   ? Arthritis of right foot   ? B12 deficiency   ? Bilateral carotid artery stenosis 03/21/2021  ? a.) Carotid doppler 03/21/2021: 01-75% pRICA, 10-25% pLICA  ? CAD (coronary artery disease) 09/09/2007  ? a.) LHC 09/09/2007: EF 60%; 30% dLM, 25% pLAD, 25% mLAD, 40% oLCx, 50% pLCx, 30% pRI, 100% pRCA with good collateral flow; no interventions opting for med mgmt.  ? Chronic low back pain   ? Depression   ?  Diastolic dysfunction 20/25/4270  ? a.)  TTE 08/16/2021: EF >55%; mild AR/MR/TR, trivial PR; G1DD.  ? GERD (gastroesophageal reflux disease)   ? History of kidney stones   ? Hyperlipidemia   ? Hypertension   ? Murmur   ? Plantar fasciitis   ? Pneumonia   ? Right thyroid nodule 06/15/2021  ? a.) CTA neck 06/15/2021: measured 72m.  ? Syncope   ? T2DM (type 2 diabetes mellitus) ([place]   ? TIA (transient ischemic attack)   ? ? ?Social History  ? ?Socioeconomic History  ? Marital status: Married  ?  Spouse name: MLelan Pons ? Number of children: 2  ? Years of education: 9th grade  ? Highest education level: Not on file  ?Occupational History  ? Occupation: employed  ?Tobacco Use  ? Smoking status: Never  ? Smokeless tobacco: Never  ?Vaping Use  ? Vaping Use: Never used  ?Substance and Sexual Activity  ? Alcohol use: Not Currently  ?   Comment: on rare occasion  ? Drug use: Yes  ?  Types: Marijuana  ? Sexual activity: Not Currently  ?Other Topics Concern  ? Not on file  ?Social History Narrative  ? Live in the house with wife  ? ?Social Determinants of Health  ? ?Financial Resource Strain: Low Risk   ? Difficulty of Paying Living Expenses: Not hard at all  ?Food Insecurity: No Food Insecurity  ? Worried About RCharity fundraiserin the Last Year: Never true  ? Ran Out of Food in the Last Year: Never true  ?Transportation Needs: No Transportation Needs  ? Lack of Transportation (Medical): No  ? Lack of Transportation (Non-Medical): No  ?Physical Activity: Inactive  ? Days of Exercise per Week: 0 days  ? Minutes of Exercise per Session: 0 min  ?Stress: No Stress Concern Present  ? Feeling of Stress : Not at all  ?Social Connections: Not on file  ?Intimate Partner Violence: Not on file  ? ? ?Past Surgical History:  ?Procedure Laterality Date  ? CARDIAC CATHETERIZATION    ? COLONOSCOPY  2011  ? Polyp removed, follow up in 5 years  ? COLONOSCOPY WITH PROPOFOL N/A 01/14/2017  ? Procedure: COLONOSCOPY WITH PROPOFOL;  Surgeon: AJonathon Bellows MD;  [facility];  Service: Endoscopy;  Laterality: N/A;  ? ENDARTERECTOMY Left 09/06/2021  ? Procedure: ENDARTERECTOMY CAROTID;  Surgeon: DAlgernon Huxley MD;  Location: [facility] OR[facility];  Service: Vascular;  Laterality: Left;  ? ? ?Family History  ?Problem Relation Age of Onset  ? Heart disease Mother   ? Hyperlipidemia Mother   ? Diabetes Mother   ? Congestive Heart Failure Mother   ? Hypertension Mother   ? Diabetes Father   ? Heart attack Father   ? Hyperlipidemia Father   ? ? ?Allergies  ?Allergen Reactions  ? Statins Other (See Comments)  ?  fatigue ?fatigue  ? Zetia [Ezetimibe] Other (See Comments)  ?  Constipation  ? Niacin Rash  ? Niacin And Related Rash  ? ? ? ?  Latest Ref Rng & Units 09/07/2021  ?  4:05 AM 09/04/2021  ?  8:55 AM 08/02/2021  ?  3:15 PM  ?CBC  ?WBC 4.0 - 10.5 K/uL 10.2   6.7   6.4    ?Hemoglobin  13.0 - 17.0 g/dL 12.2   14.8   15.0    ?Hematocrit 39.0 - 52.0 % 38.0   45.7   45.8    ?Platelets 150 - 400 K/uL 176  221   245    ? ? ? ? ?CMP  ?   ?Component Value Date/Time  ? NA 137 09/07/2021 0405  ? NA 140 08/02/2021 1515  ? NA 136 07/19/2012 0615  ? K 4.2 09/07/2021 0405  ? K 3.6 07/19/2012 0615  ? CL 107 09/07/2021 0405  ? CL 103 07/19/2012 0615  ? CO2 23 09/07/2021 0405  ? CO2 26 07/19/2012 0615  ? GLUCOSE 156 (H) 09/07/2021 0405  ? GLUCOSE 115 (H) 07/19/2012 0615  ? BUN 16 09/07/2021 0405  ? BUN 17 08/02/2021 1515  ? BUN 17 07/19/2012 0615  ? CREATININE 1.09 09/07/2021 0405  ? CREATININE 1.12 07/19/2012 0615  ? CALCIUM 8.4 (L) 09/07/2021 0405  ? CALCIUM 7.7 (L) 07/19/2012 0615  ? PROT 6.8 08/02/2021 1515  ? PROT 5.7 (L) 07/19/2012 0615  ? ALBUMIN 4.7 08/02/2021 1515  ? ALBUMIN 3.0 (L) 07/19/2012 0615  ? AST 22 08/02/2021 1515  ? AST 32 09/23/2016 1556  ? AST 46 (H) 07/19/2012 0615  ? ALT 19 08/02/2021 1515  ? ALT 19 09/23/2016 1556  ? ALT 30 07/19/2012 0615  ? ALKPHOS 109 08/02/2021 1515  ? ALKPHOS 80 07/19/2012 0615  ? BILITOT 0.4 08/02/2021 1515  ? BILITOT 0.3 07/19/2012 0615  ? GFRNONAA >60 09/07/2021 0405  ? GFRNONAA >60 07/19/2012 0615  ? GFRAA 79 02/17/2020 1439  ? GFRAA >60 07/19/2012 0615  ? ? ? ?No results found. ? ?   ?Assessment & Plan:  ? ?1. Left carotid stenosis ?The patient CT scan did note a 80 to 85% stenosis of the left ICA although it is not notable today on ultrasound.  Given the current issues the patient is having he is currently not interested in moving forward with any intervention on the left carotid at this time.  It is certainly reasonable to work-up the symptoms and issues before any discussion of any stenting of the left ICA. ?- VAS US CAROTID ? ?2. S/P carotid endarterectomy ?Given the issues with swallowing and drooling post surgery we will refer the patient to ENT for evaluation.  We suspect there may be some irritation of the marginal mandibular nerve.  Given the patient's  mental status as well as personality changes we will also evaluate for possible CVA with a head CT. ?- VAS US CAROTID ?- CT HEAD W & WO CONTRAST (5MM); Future ?- Ambulatory referral to ENT ? ?3. Altered mental

## 2021-11-05 ENCOUNTER — Ambulatory Visit
Admission: RE | Admit: 2021-11-05 | Discharge: 2021-11-05 | Disposition: A | Discharge status: Home / Self Care | Payer: Medicare Other | Source: Ambulatory Visit | Attending: Nurse Practitioner | Admitting: Nurse Practitioner

## 2021-11-05 DIAGNOSIS — Z9889 Other specified postprocedural states: Secondary | ICD-10-CM | POA: Insufficient documentation

## 2021-11-05 DIAGNOSIS — I6521 Occlusion and stenosis of right carotid artery: Secondary | ICD-10-CM | POA: Diagnosis not present

## 2021-11-05 DIAGNOSIS — R4182 Altered mental status, unspecified: Secondary | ICD-10-CM | POA: Diagnosis not present

## 2021-11-05 LAB — POCT I-STAT CREATININE: Creatinine, Ser: 1.1 mg/dL (ref 0.61–1.24)

## 2021-11-05 MED ORDER — IOHEXOL 300 MG/ML  SOLN
75.0000 mL | Freq: Once | INTRAMUSCULAR | Status: AC | PRN
  Administered 2021-11-05: 75 mL via INTRAVENOUS

## 2021-11-13 ENCOUNTER — Ambulatory Visit (INDEPENDENT_AMBULATORY_CARE_PROVIDER_SITE_OTHER): Payer: Medicare Other | Admitting: Vascular Surgery

## 2021-11-16 ENCOUNTER — Ambulatory Visit (INDEPENDENT_AMBULATORY_CARE_PROVIDER_SITE_OTHER): Payer: Medicare Other | Admitting: Vascular Surgery

## 2021-11-16 ENCOUNTER — Encounter (INDEPENDENT_AMBULATORY_CARE_PROVIDER_SITE_OTHER): Payer: Self-pay | Admitting: Vascular Surgery

## 2021-11-16 VITALS — BP 152/73 | HR 59 | Resp 17 | Ht 66.0 in | Wt 160.0 lb

## 2021-11-16 DIAGNOSIS — I6523 Occlusion and stenosis of bilateral carotid arteries: Secondary | ICD-10-CM

## 2021-11-16 DIAGNOSIS — E782 Mixed hyperlipidemia: Secondary | ICD-10-CM

## 2021-11-16 DIAGNOSIS — I10 Essential (primary) hypertension: Secondary | ICD-10-CM

## 2021-11-16 NOTE — Progress Notes (Signed)
MRN : [id number]  Nathan Fields is a 71 y.o. ([birth year]) male who presents with chief complaint of No chief complaint on file. Marland Kitchen  History of Present Illness: Patient returns today in follow up of his carotid disease.  About 2 months ago, the patient underwent left carotid endarterectomy.  He has had a lot of facial numbness, speech and swallowing limitations from numbness in his jaw and tongue since that time.  He also feels like his swallowing gets worse when he lays flat.  His postoperative duplex showed this left carotid endarterectomy site to be patent without significant recurrent stenosis.  His right carotid velocities actually fell in the 1 to 39% range which does not correlate with his CT scan performed late last year which suggested a relatively high-grade stenosis on the right but this was densely calcific which may limit ultrasound evaluation.  He had a CT scan of the head which I reviewed which showed no acute abnormality or signs of a stroke after his surgery.  He did have some chronic microvascular ischemia.  Current Outpatient Medications  Medication Sig Dispense Refill   amLODipine (NORVASC) 2.5 MG tablet Take 1 tablet (2.5 mg total) by mouth daily. 90 tablet 1   aspirin 81 MG tablet Take 81 mg by mouth daily.     ibuprofen (ADVIL) 600 MG tablet Take 600 mg by mouth every 6 (six) hours as needed.     rosuvastatin (CRESTOR) 5 MG tablet Take 1 tablet (5 mg total) by mouth every other day. 45 tablet 1   valsartan (DIOVAN) 40 MG tablet Take 1 tablet (40 mg total) by mouth daily. 90 tablet 1   No current facility-administered medications for this visit.    Past Medical History:  Diagnosis Date   Anxiety    Aortic atherosclerosis ([place])    Arthritis of right foot    B12 deficiency    Bilateral carotid artery stenosis 03/21/2021   a.) Carotid doppler 03/21/2021: 81-85% pRICA, 63-14% pLICA   CAD (coronary artery disease) 09/09/2007   a.) LHC 09/09/2007: EF 60%; 30% dLM, 25%  pLAD, 25% mLAD, 40% oLCx, 50% pLCx, 30% pRI, 100% pRCA with good collateral flow; no interventions opting for med mgmt.   Chronic low back pain    Depression    Diastolic dysfunction 97/08/6376   a.)  TTE 08/16/2021: EF >55%; mild AR/MR/TR, trivial PR; G1DD.   GERD (gastroesophageal reflux disease)    History of kidney stones    Hyperlipidemia    Hypertension    Murmur    Plantar fasciitis    Pneumonia    Right thyroid nodule 06/15/2021   a.) CTA neck 06/15/2021: measured 19m.   Syncope    T2DM (type 2 diabetes mellitus) ([place]    TIA (transient ischemic attack)     Past Surgical History:  Procedure Laterality Date   CARDIAC CATHETERIZATION     COLONOSCOPY  2011   Polyp removed, follow up in 5 years   COLONOSCOPY WITH PROPOFOL N/A 01/14/2017   Procedure: COLONOSCOPY WITH PROPOFOL;  Surgeon: AJonathon Bellows MD;  [facility];  Service: Endoscopy;  Laterality: N/A;   ENDARTERECTOMY Left 09/06/2021   Procedure: ENDARTERECTOMY CAROTID;  Surgeon: DAlgernon Huxley MD;  Location: [facility];  Service: Vascular;  Laterality: Left;     Social History   Tobacco Use   Smoking status: Never   Smokeless tobacco: Never  Vaping Use   Vaping Use: Never used  Substance Use Topics   Alcohol use:  Not Currently    Comment: on rare occasion   Drug use: Yes    Types: Marijuana      Family History  Problem Relation Age of Onset   Heart disease Mother    Hyperlipidemia Mother    Diabetes Mother    Congestive Heart Failure Mother    Hypertension Mother    Diabetes Father    Heart attack Father    Hyperlipidemia Father     Allergies  Allergen Reactions   Statins Other (See Comments)    fatigue fatigue   Zetia [Ezetimibe] Other (See Comments)    Constipation   Niacin Rash   Niacin And Related Rash     REVIEW OF SYSTEMS (Negative unless checked)  Constitutional: '[]'$ Weight loss  '[]'$ Fever  '[]'$ Chills Cardiac: '[]'$ Chest pain   '[]'$ Chest pressure   '[]'$ Palpitations   '[]'$ Shortness of  breath when laying flat   '[]'$ Shortness of breath at rest   '[]'$ Shortness of breath with exertion. Vascular:  '[]'$ Pain in legs with walking   '[]'$ Pain in legs at rest   '[]'$ Pain in legs when laying flat   '[]'$ Claudication   '[]'$ Pain in feet when walking  '[]'$ Pain in feet at rest  '[]'$ Pain in feet when laying flat   '[]'$ History of DVT   '[]'$ Phlebitis   '[]'$ Swelling in legs   '[]'$ Varicose veins   '[]'$ Non-healing ulcers Pulmonary:   '[]'$ Uses home oxygen   '[]'$ Productive cough   '[]'$ Hemoptysis   '[]'$ Wheeze  '[]'$ COPD   '[]'$ Asthma Neurologic:  '[]'$ Dizziness  '[]'$ Blackouts   '[]'$ Seizures   '[]'$ History of stroke   '[]'$ History of TIA  '[]'$ Aphasia   '[]'$ Temporary blindness   '[x]'$ Dysphagia   '[]'$ Weakness or numbness in arms   '[]'$ Weakness or numbness in legs Musculoskeletal:  '[]'$ Arthritis   '[]'$ Joint swelling   '[]'$ Joint pain   '[]'$ Low back pain Hematologic:  '[]'$ Easy bruising  '[]'$ Easy bleeding   '[]'$ Hypercoagulable state   '[]'$ Anemic   Gastrointestinal:  '[]'$ Blood in stool   '[]'$ Vomiting blood  '[]'$ Gastroesophageal reflux/heartburn   '[]'$ Abdominal pain Genitourinary:  '[]'$ Chronic kidney disease   '[]'$ Difficult urination  '[]'$ Frequent urination  '[]'$ Burning with urination   '[]'$ Hematuria Skin:  '[]'$ Rashes   '[]'$ Ulcers   '[]'$ Wounds Psychological:  '[]'$ History of anxiety   '[]'$  History of major depression.  Physical Examination  BP (!) 152/73 (BP Location: Right Arm)   Pulse (!) 59   Resp 17   Ht '5\' 6"'$  (1.676 m)   Wt 160 lb (72.6 kg)   BMI 25.82 kg/m  Gen:  WD/WN, NAD Head: [place]/AT, No temporalis wasting. Ear/Nose/Throat: Hearing grossly intact, nares w/o erythema or drainage Eyes: Conjunctiva clear. Sclera non-icteric Neck: Supple.  Trachea midline Pulmonary:  Good air movement, no use of accessory muscles.  Cardiac: RRR, no JVD Vascular:  Vessel Right Left  Radial Palpable Palpable           Musculoskeletal: M/S 5/5 throughout.  No deformity or atrophy.  Neurologic: Sensation grossly intact in extremities.  Symmetrical.  Speech is fluent.  Psychiatric: Judgment intact, Mood & affect  appropriate for pt's clinical situation. Dermatologic: No rashes or ulcers noted.  No cellulitis or open wounds.      Labs Recent Results (from the past 2160 hour(s))  SARS CORONAVIRUS 2 (TAT 6-24 HRS) Nasopharyngeal Nasopharyngeal Swab     Status: None   Collection Time: 09/04/21  8:22 AM   Specimen: Nasopharyngeal Swab  Result Value Ref Range   SARS Coronavirus 2 NEGATIVE NEGATIVE    Comment: (NOTE) SARS-CoV-2 target nucleic acids are NOT DETECTED.  The SARS-CoV-2  RNA is generally detectable in upper and lower respiratory specimens during the acute phase of infection. Negative results do not preclude SARS-CoV-2 infection, do not rule out co-infections with other pathogens, and should not be used as the sole basis for treatment or other patient management decisions. Negative results must be combined with clinical observations, patient history, and epidemiological information. The expected result is Negative.  Fact Sheet for Patients: SugarRoll.be  Fact Sheet for Healthcare Providers: https://www.woods-mathews.com/  This test is not yet approved or cleared by the [place] FDA and  has been authorized for detection and/or diagnosis of SARS-CoV-2 by FDA under an Emergency Use Authorization (EUA). This EUA will remain  in effect (meaning this test can be used) for the duration of the COVID-19 declaration under Se ction 564(b)(1) of the Act, 21 U.S.C. section 360bbb-3(b)(1), unless the authorization is terminated or revoked sooner.  Performed at [facility], [place] [address] [place], [place] [address]   CBC WITH DIFFERENTIAL     Status: None   Collection Time: 09/04/21  8:55 AM  Result Value Ref Range   WBC 6.7 4.0 - 10.5 K/uL   RBC 5.02 4.22 - 5.81 MIL/uL   Hemoglobin 14.8 13.0 - 17.0 g/dL   HCT 45.7 39.0 - 52.0 %   MCV 91.0 80.0 - 100.0 fL   MCH 29.5 26.0 - 34.0 pg   MCHC 32.4 30.0 - 36.0 g/dL   RDW 13.3 11.5 - 15.5  %   Platelets 221 150 - 400 K/uL   nRBC 0.0 0.0 - 0.2 %   Neutrophils Relative % 54 %   Neutro Abs 3.6 1.7 - 7.7 K/uL   Lymphocytes Relative 33 %   Lymphs Abs 2.2 0.7 - 4.0 K/uL   Monocytes Relative 9 %   Monocytes Absolute 0.6 0.1 - 1.0 K/uL   Eosinophils Relative 2 %   Eosinophils Absolute 0.2 0.0 - 0.5 K/uL   Basophils Relative 2 %   Basophils Absolute 0.1 0.0 - 0.1 K/uL   Immature Granulocytes 0 %   Abs Immature Granulocytes 0.02 0.00 - 0.07 K/uL    Comment: Performed at [facility], [address], [place], [place] [address]  Basic metabolic panel     Status: Abnormal   Collection Time: 09/04/21  8:55 AM  Result Value Ref Range   Sodium 137 135 - 145 mmol/L   Potassium 4.2 3.5 - 5.1 mmol/L   Chloride 104 98 - 111 mmol/L   CO2 25 22 - 32 mmol/L   Glucose, Bld 101 (H) 70 - 99 mg/dL    Comment: Glucose reference range applies only to samples taken after fasting for at least 8 hours.   BUN 21 8 - 23 mg/dL   Creatinine, Ser 1.11 0.61 - 1.24 mg/dL   Calcium 9.3 8.9 - 10.3 mg/dL   GFR, Estimated >60 >60 mL/min    Comment: (NOTE) Calculated using the CKD-EPI Creatinine Equation (2021)    Anion gap 8 5 - 15    Comment: Performed at [facility], [address] [place], [place] [address]  Type and screen     Status: None   Collection Time: 09/04/21  8:55 AM  Result Value Ref Range   ABO/RH(D) A POS    Antibody Screen NEG    Sample Expiration 09/18/2021,2359    Extend sample reason      NO TRANSFUSIONS OR PREGNANCY IN THE PAST 3 MONTHS Performed at [facility], [place],  [place] [address]   ABO/Rh     Status: None   Collection Time: 09/06/21 12:10 PM  Result Value Ref Range   ABO/RH(D)      A POS Performed at [facility], [address] [place], [place] [address]   Surgical pathology     Status: None   Collection Time: 09/06/21  2:41 PM  Result Value Ref Range   SURGICAL PATHOLOGY      SURGICAL  PATHOLOGY CASE: [id number] PATIENT: [place] Janik Surgical Pathology Report     Specimen Submitted: A. Plaque, left carotid  Clinical History: Carotid artery stenosis    DIAGNOSIS: A. ARTERY, LEFT CAROTID; ENDARTERECTOMY: - CALCIFIED ATHEROMATOUS PLAQUE.  GROSS DESCRIPTION: A. Labeled: Left carotid plaque Received: Fresh Collection time: 2:41 PM on 09/06/2021 Placed into formalin time: 4:11 PM on 09/06/2021 Tissue fragment(s): multiple Size: Aggregate, 3.0 x 1.8 x 1.3 cm Description: Received are yellow-brown, partially calcified fragments of tissue Representative sections are submitted in 1 cassette.  CM 09/07/2021  Final Diagnosis performed by Allena Napoleon, MD.   Electronically signed 09/10/2021 1:34:50PM The electronic signature indicates that the named Attending Pathologist has evaluated the specimen Technical component performed at [facility], [address], [place], [place] [address] Lab: [phone] Dir: Rush Farmer, MD, MMM  Professiona l component performed at [facility], [facility], [place], [place], [place] [address] Lab: [phone] Dir: Kathi Simpers, MD   Glucose, capillary     Status: Abnormal   Collection Time: 09/06/21  3:44 PM  Result Value Ref Range   Glucose-Capillary 114 (H) 70 - 99 mg/dL    Comment: Glucose reference range applies only to samples taken after fasting for at least 8 hours.  Glucose, capillary     Status: Abnormal   Collection Time: 09/06/21  5:36 PM  Result Value Ref Range   Glucose-Capillary 121 (H) 70 - 99 mg/dL    Comment: Glucose reference range applies only to samples taken after fasting for at least 8 hours.  MRSA Next Gen by PCR, Nasal     Status: None   Collection Time: 09/06/21  9:00 PM   Specimen: Nasal Mucosa; Nasal Swab  Result Value Ref Range   MRSA by PCR Next Gen NOT DETECTED NOT DETECTED    Comment: (NOTE) The GeneXpert MRSA Assay (FDA approved for NASAL specimens only), is one  component of a comprehensive MRSA colonization surveillance program. It is not intended to diagnose MRSA infection nor to guide or monitor treatment for MRSA infections. Test performance is not FDA approved in patients less than 16 years old. Performed at [facility], [address] [place], [place] [address]   CBC     Status: Abnormal   Collection Time: 09/07/21  4:05 AM  Result Value Ref Range   WBC 10.2 4.0 - 10.5 K/uL   RBC 4.15 (L) 4.22 - 5.81 MIL/uL   Hemoglobin 12.2 (L) 13.0 - 17.0 g/dL   HCT 38.0 (L) 39.0 - 52.0 %   MCV 91.6 80.0 - 100.0 fL   MCH 29.4 26.0 - 34.0 pg   MCHC 32.1 30.0 - 36.0 g/dL   RDW 13.1 11.5 - 15.5 %   Platelets 176 150 - 400 K/uL   nRBC 0.0 0.0 - 0.2 %    Comment: Performed at [facility], [address], [place], [place] [address]  Basic metabolic panel     Status: Abnormal   Collection Time: 09/07/21  4:05 AM  Result Value Ref Range  Sodium 137 135 - 145 mmol/L   Potassium 4.2 3.5 - 5.1 mmol/L   Chloride 107 98 - 111 mmol/L   CO2 23 22 - 32 mmol/L   Glucose, Bld 156 (H) 70 - 99 mg/dL    Comment: Glucose reference range applies only to samples taken after fasting for at least 8 hours.   BUN 16 8 - 23 mg/dL   Creatinine, Ser 1.09 0.61 - 1.24 mg/dL   Calcium 8.4 (L) 8.9 - 10.3 mg/dL   GFR, Estimated >60 >60 mL/min    Comment: (NOTE) Calculated using the CKD-EPI Creatinine Equation (2021)    Anion gap 7 5 - 15    Comment: Performed at [facility], [address] [place], [place] [address]  Magnesium     Status: None   Collection Time: 09/07/21  4:05 AM  Result Value Ref Range   Magnesium 1.8 1.7 - 2.4 mg/dL    Comment: Performed at [facility], [address] [place],  [address]  I-STAT creatinine     Status: None   Collection Time: 11/05/21  2:08 PM  Result Value Ref Range   Creatinine, Ser 1.10 0.61 - 1.24 mg/dL    Radiology CT HEAD W & WO CONTRAST (5MM)  Result Date:  11/06/2021 CLINICAL DATA:  Acute neuro deficit.  Mental status change EXAM: CT HEAD WITHOUT AND WITH CONTRAST TECHNIQUE: Contiguous axial images were obtained from the base of the skull through the vertex without and with intravenous contrast. RADIATION DOSE REDUCTION: This exam was performed according to the departmental dose-optimization program which includes automated exposure control, adjustment of the mA and/or kV according to patient size and/or use of iterative reconstruction technique. CONTRAST:  20m OMNIPAQUE IOHEXOL 300 MG/ML  SOLN COMPARISON:  CT head 08/08/2021 FINDINGS: Brain: Ventricle size and cerebral volume normal. Mild hypodensity left frontal white matter unchanged. Negative for acute infarct, hemorrhage, mass.  Normal enhancement. Vascular: Negative for hyperdense vessel. Normal vascular enhancement. Skull: Negative Sinuses/Orbits: Negative Other: None IMPRESSION: No acute abnormality Mild white matter changes left frontal white matter likely due to chronic microvascular ischemia Electronically Signed   By: CFranchot GalloM.D.   On: 11/06/2021 12:09    Assessment/Plan  Carotid stenosis, asymptomatic, bilateral His right carotid velocities actually fell in the 1 to 39% range which does not correlate with his CT scan performed late last year which suggested a relatively high-grade stenosis on the right but this was densely calcific which may limit ultrasound evaluation.  He has had a difficult time recovering from his left carotid due to facial numbness and speech and swallowing issues.  He had a CT scan that showed no neurologic abnormalities or stroke that would explain his symptoms.  Were going to refer him over to ENT to evaluate this.  He probably needs his right carotid fixed but he will not consider that at this time which is certainly reasonable given his prolonged recovery from the left.  His anatomy is not great for stenting due to dense calcification, but that can be considered in  the future as well.  For now, we will plan to see him back in 2 to 3 months with carotid duplex.  Hypertension blood pressure control important in reducing the progression of atherosclerotic disease. On appropriate oral medications.   Hyperlipidemia lipid control important in reducing the progression of atherosclerotic disease. Continue statin therapy    JLeotis Pain MD  11/16/2021 12:13 PM    This note was created with [employer]  medical transcription system.  Any errors from dictation are purely unintentional

## 2021-11-16 NOTE — Assessment & Plan Note (Signed)
blood pressure control important in reducing the progression of atherosclerotic disease. On appropriate oral medications.  

## 2021-11-16 NOTE — Assessment & Plan Note (Signed)
lipid control important in reducing the progression of atherosclerotic disease. Continue statin therapy  

## 2021-11-16 NOTE — Assessment & Plan Note (Signed)
His right carotid velocities actually fell in the 1 to 39% range which does not correlate with his CT scan performed late last year which suggested a relatively high-grade stenosis on the right but this was densely calcific which may limit ultrasound evaluation.  He has had a difficult time recovering from his left carotid due to facial numbness and speech and swallowing issues.  He had a CT scan that showed no neurologic abnormalities or stroke that would explain his symptoms.  Were going to refer him over to ENT to evaluate this.  He probably needs his right carotid fixed but he will not consider that at this time which is certainly reasonable given his prolonged recovery from the left.  His anatomy is not great for stenting due to dense calcification, but that can be considered in the future as well.  For now, we will plan to see him back in 2 to 3 months with carotid duplex.

## 2021-12-05 ENCOUNTER — Ambulatory Visit (INDEPENDENT_AMBULATORY_CARE_PROVIDER_SITE_OTHER): Payer: Medicare Other | Admitting: *Deleted

## 2021-12-05 ENCOUNTER — Telehealth: Payer: Self-pay

## 2021-12-05 DIAGNOSIS — Z0000 Encounter for general adult medical examination without abnormal findings: Secondary | ICD-10-CM | POA: Diagnosis not present

## 2021-12-05 DIAGNOSIS — Z1211 Encounter for screening for malignant neoplasm of colon: Secondary | ICD-10-CM

## 2021-12-05 NOTE — Telephone Encounter (Signed)
CALLED PATIENT NO ANSWER LEFT VOICEMAIL FOR A CALL BACK ? ?

## 2021-12-05 NOTE — Patient Instructions (Signed)
Nathan Fields , Thank you for taking time to come for your Medicare Wellness Visit. I appreciate your ongoing commitment to your health goals. Please review the following plan we discussed and let me know if I can assist you in the future.   Screening recommendations/referrals: Colonoscopy: Education provided  due' \\7'$ -2023 Recommended yearly ophthalmology/optometry visit for glaucoma screening and checkup Recommended yearly dental visit for hygiene and checkup  Vaccinations: Influenza vaccine: Education provided Pneumococcal vaccine: Education provided Tdap vaccine: Education provided Shingles vaccine: Education provided    Advanced directives: on file  Conditions/risks identified:    Preventive Care 40 Years and Older, Male Preventive care refers to lifestyle choices and visits with your health care provider that can promote health and wellness. What does preventive care include? A yearly physical exam. This is also called an annual well check. Dental exams once or twice a year. Routine eye exams. Ask your health care provider how often you should have your eyes checked. Personal lifestyle choices, including: Daily care of your teeth and gums. Regular physical activity. Eating a healthy diet. Avoiding tobacco and drug use. Limiting alcohol use. Practicing safe sex. Taking low doses of aspirin every day. Taking vitamin and mineral supplements as recommended by your health care provider. What happens during an annual well check? The services and screenings done by your health care provider during your annual well check will depend on your age, overall health, lifestyle risk factors, and family history of disease. Counseling  Your health care provider may ask you questions about your: Alcohol use. Tobacco use. Drug use. Emotional well-being. Home and relationship well-being. Sexual activity. Eating habits. History of falls. Memory and ability to understand (cognition). Work  and work Statistician. Screening  You may have the following tests or measurements: Height, weight, and BMI. Blood pressure. Lipid and cholesterol levels. These may be checked every 5 years, or more frequently if you are over 96 years old. Skin check. Lung cancer screening. You may have this screening every year starting at age 31 if you have a 30-pack-year history of smoking and currently smoke or have quit within the past 15 years. Fecal occult blood test (FOBT) of the stool. You may have this test every year starting at age 4. Flexible sigmoidoscopy or colonoscopy. You may have a sigmoidoscopy every 5 years or a colonoscopy every 10 years starting at age 65. Prostate cancer screening. Recommendations will vary depending on your family history and other risks. Hepatitis C blood test. Hepatitis B blood test. Sexually transmitted disease (STD) testing. Diabetes screening. This is done by checking your blood sugar (glucose) after you have not eaten for a while (fasting). You may have this done every 1-3 years. Abdominal aortic aneurysm (AAA) screening. You may need this if you are a current or former smoker. Osteoporosis. You may be screened starting at age 12 if you are at high risk. Talk with your health care provider about your test results, treatment options, and if necessary, the need for more tests. Vaccines  Your health care provider may recommend certain vaccines, such as: Influenza vaccine. This is recommended every year. Tetanus, diphtheria, and acellular pertussis (Tdap, Td) vaccine. You may need a Td booster every 10 years. Zoster vaccine. You may need this after age 3. Pneumococcal 13-valent conjugate (PCV13) vaccine. One dose is recommended after age 53. Pneumococcal polysaccharide (PPSV23) vaccine. One dose is recommended after age 74. Talk to your health care provider about which screenings and vaccines you need and how often you need  them. This information is not intended  to replace advice given to you by your health care provider. Make sure you discuss any questions you have with your health care provider. Document Released: 07/14/2015 Document Revised: 03/06/2016 Document Reviewed: 04/18/2015 Elsevier Interactive Patient Education  2017 [place] Prevention in the Home Falls can cause injuries. They can happen to people of all ages. There are many things you can do to make your home safe and to help prevent falls. What can I do on the outside of my home? Regularly fix the edges of walkways and driveways and fix any cracks. Remove anything that might make you trip as you walk through a door, such as a raised step or threshold. Trim any bushes or trees on the path to your home. Use bright outdoor lighting. Clear any walking paths of anything that might make someone trip, such as rocks or tools. Regularly check to see if handrails are loose or broken. Make sure that both sides of any steps have handrails. Any raised decks and porches should have guardrails on the edges. Have any leaves, snow, or ice cleared regularly. Use sand or salt on walking paths during winter. Clean up any spills in your garage right away. This includes oil or grease spills. What can I do in the bathroom? Use night lights. Install grab bars by the toilet and in the tub and shower. Do not use towel bars as grab bars. Use non-skid mats or decals in the tub or shower. If you need to sit down in the shower, use a plastic, non-slip stool. Keep the floor dry. Clean up any water that spills on the floor as soon as it happens. Remove soap buildup in the tub or shower regularly. Attach bath mats securely with double-sided non-slip rug tape. Do not have throw rugs and other things on the floor that can make you trip. What can I do in the bedroom? Use night lights. Make sure that you have a light by your bed that is easy to reach. Do not use any sheets or blankets that are too big  for your bed. They should not hang down onto the floor. Have a firm chair that has side arms. You can use this for support while you get dressed. Do not have throw rugs and other things on the floor that can make you trip. What can I do in the kitchen? Clean up any spills right away. Avoid walking on wet floors. Keep items that you use a lot in easy-to-reach places. If you need to reach something above you, use a strong step stool that has a grab bar. Keep electrical cords out of the way. Do not use floor polish or wax that makes floors slippery. If you must use wax, use non-skid floor wax. Do not have throw rugs and other things on the floor that can make you trip. What can I do with my stairs? Do not leave any items on the stairs. Make sure that there are handrails on both sides of the stairs and use them. Fix handrails that are broken or loose. Make sure that handrails are as long as the stairways. Check any carpeting to make sure that it is firmly attached to the stairs. Fix any carpet that is loose or worn. Avoid having throw rugs at the top or bottom of the stairs. If you do have throw rugs, attach them to the floor with carpet tape. Make sure that you have a light switch at  the top of the stairs and the bottom of the stairs. If you do not have them, ask someone to add them for you. What else can I do to help prevent falls? Wear shoes that: Do not have high heels. Have rubber bottoms. Are comfortable and fit you well. Are closed at the toe. Do not wear sandals. If you use a stepladder: Make sure that it is fully opened. Do not climb a closed stepladder. Make sure that both sides of the stepladder are locked into place. Ask someone to hold it for you, if possible. Clearly mark and make sure that you can see: Any grab bars or handrails. First and last steps. Where the edge of each step is. Use tools that help you move around (mobility aids) if they are needed. These  include: Canes. Walkers. Scooters. Crutches. Turn on the lights when you go into a dark area. Replace any light bulbs as soon as they burn out. Set up your furniture so you have a clear path. Avoid moving your furniture around. If any of your floors are uneven, fix them. If there are any pets around you, be aware of where they are. Review your medicines with your doctor. Some medicines can make you feel dizzy. This can increase your chance of falling. Ask your doctor what other things that you can do to help prevent falls. This information is not intended to replace advice given to you by your health care provider. Make sure you discuss any questions you have with your health care provider. Document Released: 04/13/2009 Document Revised: 11/23/2015 Document Reviewed: 07/22/2014 Elsevier Interactive Patient Education  2017 [employer].

## 2021-12-05 NOTE — Progress Notes (Signed)
Subjective:   Nathan Fields is a 71 y.o. male who presents for Medicare Annual/Subsequent preventive examination.  I connected with  Nathan Fields on 12/05/21 by a telephone enabled telemedicine application and verified that I am speaking with the correct person using two identifiers.   I discussed the limitations of evaluation and management by telemedicine. The patient expressed understanding and agreed to proceed.  Patient location: home  Provider location: Tele-Health-home    Review of Systems     Cardiac Risk Factors include: advanced age (>28mn, >>52women);male gender;sedentary lifestyle;hypertension;family history of premature cardiovascular disease     Objective:    Today's Vitals   12/05/21 1206  PainSc: 5    There is no height or weight on file to calculate BMI.     12/05/2021   12:08 PM 09/06/2021    6:00 PM 09/06/2021   11:59 AM 08/30/2021   12:46 PM 12/04/2020    2:31 PM 11/24/2019    2:34 PM 08/22/2017   12:56 PM  Advanced Directives  Does Patient Have a Medical Advance Directive? Yes No Yes Yes Yes Yes Yes  Type of ATheatre managerof [place] will Living will;Healthcare Power of [place] will  Does patient want to make changes to medical advance directive?  No - Patient declined       Copy of [place]? Yes - validated most recent copy scanned in chart (See row information)    No - copy requested No - copy requested   Would patient like information on creating a medical advance directive? No - Patient declined No - Patient declined         Current Medications (verified) Outpatient Encounter Medications as of 12/05/2021  Medication Sig   amLODipine (NORVASC) 2.5 MG tablet Take 1 tablet (2.5 mg total) by mouth daily.   aspirin 81 MG tablet Take 81 mg by mouth daily.   rosuvastatin (CRESTOR) 5 MG tablet Take 1 tablet (5 mg total) by mouth  every other day.   valsartan (DIOVAN) 40 MG tablet Take 1 tablet (40 mg total) by mouth daily.   ibuprofen (ADVIL) 600 MG tablet Take 600 mg by mouth every 6 (six) hours as needed.   No facility-administered encounter medications on file as of 12/05/2021.    Allergies (verified) Statins, Zetia [ezetimibe], Niacin, and Niacin and related   History: Past Medical History:  Diagnosis Date   Anxiety    Aortic atherosclerosis ([place]    Arthritis of right foot    B12 deficiency    Bilateral carotid artery stenosis 03/21/2021   a.) Carotid doppler 03/21/2021: 582-50%pRICA, 703-70%pLICA   CAD (coronary artery disease) 09/09/2007   a.) LHC 09/09/2007: EF 60%; 30% dLM, 25% pLAD, 25% mLAD, 40% oLCx, 50% pLCx, 30% pRI, 100% pRCA with good collateral flow; no interventions opting for med mgmt.   Chronic low back pain    Depression    Diastolic dysfunction 048/88/9169  a.)  TTE 08/16/2021: EF >55%; mild AR/MR/TR, trivial PR; G1DD.   GERD (gastroesophageal reflux disease)    History of kidney stones    Hyperlipidemia    Hypertension    Murmur    Plantar fasciitis    Pneumonia    Right thyroid nodule 06/15/2021   a.) CTA neck 06/15/2021: measured 755m   Syncope    T2[place] (type 2 diabetes mellitus) [place]   TIA (transient ischemic  attack)    Past Surgical History:  Procedure Laterality Date   CARDIAC CATHETERIZATION     COLONOSCOPY  2011   Polyp removed, follow up in 5 years   COLONOSCOPY WITH PROPOFOL N/A 01/14/2017   Procedure: COLONOSCOPY WITH PROPOFOL;  Surgeon: [name], MD;  Location: [facility] ENDOSCOPY;  Service: Endoscopy;  Laterality: N/A;   ENDARTERECTOMY Left 09/06/2021   Procedure: ENDARTERECTOMY CAROTID;  Surgeon: Algernon Huxley, MD;  Location: [facility] ORS;  Service: Vascular;  Laterality: Left;   Family History  Problem Relation Age of Onset   Heart disease Mother    Hyperlipidemia Mother    Diabetes Mother    Congestive Heart Failure Mother    Hypertension Mother    Diabetes  Father    Heart attack Father    Hyperlipidemia Father    Social History   Socioeconomic History   Marital status: Married    Spouse name: Nathan Fields   Number of children: 2   Years of education: 9th grade   Highest education level: Not on file  Occupational History   Occupation: employed  Tobacco Use   Smoking status: Never   Smokeless tobacco: Never  Vaping Use   Vaping Use: Never used  Substance and Sexual Activity   Alcohol use: Not Currently    Comment: on rare occasion   Drug use: Yes    Types: Marijuana   Sexual activity: Not Currently  Other Topics Concern   Not on file  Social History Narrative   Live in the house with wife   Social Determinants of Health   Financial Resource Strain: Low Risk    Difficulty of Paying Living Expenses: Not hard at all  Food Insecurity: No Food Insecurity   Worried About Charity fundraiser in the Last Year: Never true   [place] in the Last Year: Never true  Transportation Needs: No Transportation Needs   Lack of Transportation (Medical): No   Lack of Transportation (Non-Medical): No  Physical Activity: Inactive   Days of Exercise per Week: 0 days   Minutes of Exercise per Session: 0 min  Stress: No Stress Concern Present   Feeling of Stress : Only a little  Social Connections: Moderately Isolated   Frequency of Communication with Friends and Family: Three times a week   Frequency of Social Gatherings with Friends and Family: Once a week   Attends Religious Services: Never   Marine scientist or Organizations: No   Attends Music therapist: Never   Marital Status: Married    Tobacco Counseling Counseling given: Not Answered   Clinical Intake:  Pre-visit preparation completed: Yes  Pain : 0-10 Pain Score: 5  Pain Location: Back Pain Descriptors / Indicators: Constant, Burning, Aching Pain Onset: More than a month ago Pain Frequency: Constant Pain Relieving Factors: ibuprofen  Pain  Relieving Factors: ibuprofen  Nutritional Risks: None Diabetes: No  How often do you need to have someone help you when you read instructions, pamphlets, or other written materials from your doctor or pharmacy?: 1 - Never  Diabetic?  no  Interpreter Needed?: No  Information entered by :: Leroy Kennedy LPN   Activities of Daily Living    12/05/2021   12:14 PM 09/06/2021    6:00 PM  In your present state of health, do you have any difficulty performing the following activities:  Hearing? 0 0  Vision? 0 0  Difficulty concentrating or making decisions? 0 0  Walking or climbing stairs?  1 0  Dressing or bathing? 0 0  Doing errands, shopping? 1   Using the Toilet? N   In the past six months, have you accidently leaked urine? N   Do you have problems with loss of bowel control? N   Managing your Medications? Y   Managing your Finances? N   Housekeeping or managing your Housekeeping? Y     Patient Care Team: Valerie Roys, DO as PCP - General (Family Medicine) Dionisio David, MD as Consulting Physician (Cardiology)  Indicate any recent Medical Services you may have received from other than [facility] providers in the past year (date may be approximate).     Assessment:   This is a routine wellness examination for Nathan Fields.  Hearing/Vision screen Hearing Screening - Comments:: No trouble hearing Vision Screening - Comments:: Not up date [facility]  Dr. Wyatt Portela  Dietary issues and exercise activities discussed: Current Exercise Habits: Home exercise routine;The patient does not participate in regular exercise at present (yard work), Exercise limited by: orthopedic condition(s)   Goals Addressed             This Visit's Progress    Patient Stated       No goals       Depression Screen    12/05/2021   12:13 PM 04/17/2021    2:49 PM 03/06/2021    8:53 AM 12/04/2020    2:32 PM 11/28/2020    4:14 PM 02/17/2020    2:30 PM 11/24/2019    2:33 PM  PHQ 2/9 Scores  PHQ - 2 Score 2 0 0 0  0 4 0  PHQ- 9 Score 5 1   0 5     Fall Risk    12/05/2021   12:08 PM 03/06/2021    8:52 AM 12/04/2020    2:32 PM 02/17/2020    2:08 PM 11/24/2019    2:32 PM  Fall Risk   Falls in the past year? 0 0 0 1 0  Number falls in past yr: 0 0  0 0  Injury with Fall? 0 0  0 0  Risk for fall due to :  No Fall Risks Medication side effect    Follow up Falls evaluation completed;Education provided;Falls prevention discussed Falls evaluation completed Falls evaluation completed;Education provided;Falls prevention discussed      FALL RISK PREVENTION PERTAINING TO THE HOME:  Any stairs in or around the home? Yes  If so, are there any without handrails? No  Home free of loose throw rugs in walkways, pet beds, electrical cords, etc? Yes  Adequate lighting in your home to reduce risk of falls? Yes   ASSISTIVE DEVICES UTILIZED TO PREVENT FALLS:  Life alert? No  Use of a cane, walker or w/c? No  Grab bars in the bathroom? No  Shower chair or bench in shower? No  Elevated toilet seat or a handicapped toilet? No   TIMED UP AND GO:  Was the test performed? No .    Cognitive Function:        12/05/2021   12:08 PM 12/04/2020    2:33 PM 02/23/2018    2:02 PM 10/08/2016    2:38 PM  6CIT Screen  What Year? 0 points 0 points 0 points 0 points  What month? 3 points 0 points 3 points 3 points  What time? 0 points 0 points 3 points 0 points  Count back from 20 0 points 0 points 0 points 0 points  Months in reverse 4 points  4 points 0 points 0 points  Repeat phrase 2 points 6 points 4 points 4 points  Total Score 9 points 10 points 10 points 7 points    Immunizations Immunization History  Administered Date(s) Administered   Influenza, High Dose Seasonal PF 05/22/2018    TDAP status: Due, Education has been provided regarding the importance of this vaccine. Advised may receive this vaccine at local pharmacy or Health Dept. Aware to provide a copy of the vaccination record if obtained from local  pharmacy or Health Dept. Verbalized acceptance and understanding.  Flu Vaccine status: Up to date  Pneumococcal vaccine status: Declined,  Education has been provided regarding the importance of this vaccine but patient still declined. Advised may receive this vaccine at local pharmacy or Health Dept. Aware to provide a copy of the vaccination record if obtained from local pharmacy or Health Dept. Verbalized acceptance and understanding.   Covid-19 vaccine status: Declined, Education has been provided regarding the importance of this vaccine but patient still declined. Advised may receive this vaccine at local pharmacy or Health Dept.or vaccine clinic. Aware to provide a copy of the vaccination record if obtained from local pharmacy or Health Dept. Verbalized acceptance and understanding.  Qualifies for Shingles Vaccine? Yes   Zostavax completed No   Shingrix Completed?: No.    Education has been provided regarding the importance of this vaccine. Patient has been advised to call insurance company to determine out of pocket expense if they have not yet received this vaccine. Advised may also receive vaccine at local pharmacy or Health Dept. Verbalized acceptance and understanding.  Screening Tests Health Maintenance  Topic Date Due   TETANUS/TDAP  Never done   COVID-19 Vaccine (1) 12/21/2021 (Originally 05/02/1951)   Zoster Vaccines- Shingrix (1 of 2) 03/07/2022 (Originally 10/31/1969)   Pneumonia Vaccine 77+ Years old (1 - PCV) 12/06/2022 (Originally 10/30/2016)   COLONOSCOPY (Pts 45-58yr Insurance coverage will need to be confirmed)  01/14/2022   INFLUENZA VACCINE  01/29/2022   Hepatitis C Screening  Completed   HPV VACCINES  Aged Out    Health Maintenance  Health Maintenance Due  Topic Date Due   TETANUS/TDAP  Never done    Colorectal cancer screening: Referral to GI placed  . Pt aware the office will call re: appt.  Lung Cancer Screening: (Low Dose CT Chest recommended if Age 70-80 years, 30 pack-year currently smoking OR have quit w/in 15years.) does not qualify.   Lung Cancer Screening Referral:   Additional Screening:  Hepatitis C Screening: does not qualify; Completed 2016  Vision Screening: Recommended annual ophthalmology exams for early detection of glaucoma and other disorders of the eye. Is the patient up to date with their annual eye exam?  No  Who is the provider or what is the name of the office in which the patient attends annual eye exams? [facility] If pt is not established with a provider, would they like to be referred to a provider to establish care? No .   Dental Screening: Recommended annual dental exams for proper oral hygiene  Community Resource Referral / Chronic Care Management: CRR required this visit?  No   CCM required this visit?  No      Plan:     I have personally reviewed and noted the following in the patient's chart:   Medical and social history Use of alcohol, tobacco or illicit drugs  Current medications and supplements including opioid prescriptions. Patient is not currently taking opioid prescriptions. Functional ability  and status Nutritional status Physical activity Advanced directives List of other physicians Hospitalizations, surgeries, and ER visits in previous 12 months Vitals Screenings to include cognitive, depression, and falls Referrals and appointments  In addition, I have reviewed and discussed with patient certain preventive protocols, quality metrics, and best practice recommendations. A written personalized care plan for preventive services as well as general preventive health recommendations were provided to patient.     Leroy Kennedy, LPN   [id number]/11/[id number]   Nurse Notes:

## 2021-12-06 ENCOUNTER — Telehealth: Payer: Self-pay

## 2021-12-06 NOTE — Telephone Encounter (Signed)
CALLED PATIENT NO ANSWER LEFT VOICEMAIL FOR A CALL BACK ? ?

## 2021-12-07 ENCOUNTER — Telehealth: Payer: Self-pay

## 2021-12-07 ENCOUNTER — Ambulatory Visit: Payer: Medicare Other

## 2021-12-07 NOTE — Telephone Encounter (Signed)
CALLED PATIENT NO ANSWER LEFT VOICEMAIL FOR A CALL BACK  letter sent 

## 2021-12-17 DIAGNOSIS — E7800 Pure hypercholesterolemia, unspecified: Secondary | ICD-10-CM | POA: Diagnosis not present

## 2021-12-17 DIAGNOSIS — I10 Essential (primary) hypertension: Secondary | ICD-10-CM | POA: Diagnosis not present

## 2021-12-17 DIAGNOSIS — R0683 Snoring: Secondary | ICD-10-CM | POA: Diagnosis not present

## 2021-12-17 DIAGNOSIS — R200 Anesthesia of skin: Secondary | ICD-10-CM | POA: Diagnosis not present

## 2021-12-18 ENCOUNTER — Other Ambulatory Visit: Payer: Self-pay | Admitting: Neurology

## 2021-12-18 DIAGNOSIS — K117 Disturbances of salivary secretion: Secondary | ICD-10-CM

## 2021-12-26 ENCOUNTER — Ambulatory Visit
Admission: RE | Admit: 2021-12-26 | Discharge: 2021-12-26 | Disposition: A | Discharge status: Home / Self Care | Payer: Medicare Other | Source: Ambulatory Visit | Attending: Neurology | Admitting: Neurology

## 2021-12-26 DIAGNOSIS — I6782 Cerebral ischemia: Secondary | ICD-10-CM | POA: Diagnosis not present

## 2021-12-26 DIAGNOSIS — K117 Disturbances of salivary secretion: Secondary | ICD-10-CM | POA: Insufficient documentation

## 2022-01-04 ENCOUNTER — Other Ambulatory Visit: Payer: Self-pay | Admitting: Family Medicine

## 2022-01-07 NOTE — Telephone Encounter (Signed)
Requested Prescriptions  Pending Prescriptions Disp Refills  . amLODipine (NORVASC) 2.5 MG tablet [Pharmacy Med Name: AMLODIPINE BESYLATE 2.5 MG TAB] 90 tablet 1    Sig: TAKE 1 TABLET BY MOUTH EVERY DAY     Cardiovascular: Calcium Channel Blockers 2 Failed - 01/04/2022  9:11 PM      Failed - Last BP in normal range    BP Readings from Last 1 Encounters:  11/16/21 (!) 152/73         Passed - Last Heart Rate in normal range    Pulse Readings from Last 1 Encounters:  11/16/21 (!) 59         Passed - Valid encounter within last 6 months    Recent Outpatient Visits          5 months ago TIA (transient ischemic attack)   [place], Megan P, DO   8 months ago Neck pain   [facility] [place], [place], DO   10 months ago Neck pain   [facility] [place], [place], DO   1 year ago Coronary artery disease involving native coronary artery of native heart without angina pectoris   [place], Megan P, DO   1 year ago Routine general medical examination at a health care facility   [facility], [place], DO

## 2022-02-12 ENCOUNTER — Ambulatory Visit (INDEPENDENT_AMBULATORY_CARE_PROVIDER_SITE_OTHER): Payer: Medicare Other | Admitting: Vascular Surgery

## 2022-02-12 ENCOUNTER — Encounter (INDEPENDENT_AMBULATORY_CARE_PROVIDER_SITE_OTHER): Payer: Self-pay | Admitting: Vascular Surgery

## 2022-02-12 ENCOUNTER — Ambulatory Visit (INDEPENDENT_AMBULATORY_CARE_PROVIDER_SITE_OTHER): Payer: Medicare Other

## 2022-02-12 VITALS — BP 197/91 | HR 68 | Resp 16 | Wt 151.0 lb

## 2022-02-12 DIAGNOSIS — I6523 Occlusion and stenosis of bilateral carotid arteries: Secondary | ICD-10-CM

## 2022-02-12 DIAGNOSIS — E782 Mixed hyperlipidemia: Secondary | ICD-10-CM

## 2022-02-12 DIAGNOSIS — R1310 Dysphagia, unspecified: Secondary | ICD-10-CM | POA: Diagnosis not present

## 2022-02-12 DIAGNOSIS — I10 Essential (primary) hypertension: Secondary | ICD-10-CM | POA: Diagnosis not present

## 2022-02-12 NOTE — Assessment & Plan Note (Signed)
Refer to ENT for evaluation

## 2022-02-12 NOTE — Assessment & Plan Note (Signed)
blood pressure control important in reducing the progression of atherosclerotic disease. On appropriate oral medications.  

## 2022-02-12 NOTE — Assessment & Plan Note (Signed)
lipid control important in reducing the progression of atherosclerotic disease. Continue statin therapy  

## 2022-02-12 NOTE — Assessment & Plan Note (Signed)
Carotid duplex today reveals a patent left carotid endarterectomy without significant recurrent stenosis.  The velocities on the right side now fall in the 1 to 39% range.  Continue current medical regimen.  He is still having some swallowing and voice issues so I am going to refer him to ENT to see if he may have a vocal cord issue or other problem they may be able to help with.  We will plan on seeing him back with carotid duplex in 6 months.

## 2022-02-12 NOTE — Progress Notes (Signed)
MRN : [id number]  Nathan Fields is a 71 y.o. (October 09, 1950) male who presents with chief complaint of  Chief Complaint  Patient presents with   Follow-up    Ultrasound follow up  .  History of Present Illness: Patient returns in follow-up of his carotid disease.  He is about 5 months status post left carotid endarterectomy for high-grade stenosis.  He is still having some issues with swallowing difficulty and he says his voice is still not strong.  He complains of numbness in his neck.  No focal neurologic symptoms. Specifically, the patient denies amaurosis fugax, speech or swallowing difficulties, or arm or leg weakness or numbness. Carotid duplex today reveals a patent left carotid endarterectomy without significant recurrent stenosis.  The velocities on the right side now fall in the 1 to 39% range.  Current Outpatient Medications  Medication Sig Dispense Refill   amLODipine (NORVASC) 2.5 MG tablet TAKE 1 TABLET BY MOUTH EVERY DAY 90 tablet 0   aspirin 81 MG tablet Take 81 mg by mouth daily.     rosuvastatin (CRESTOR) 5 MG tablet Take 1 tablet (5 mg total) by mouth every other day. 45 tablet 1   valsartan (DIOVAN) 40 MG tablet Take 1 tablet (40 mg total) by mouth daily. 90 tablet 1   ibuprofen (ADVIL) 600 MG tablet Take 600 mg by mouth every 6 (six) hours as needed.     No current facility-administered medications for this visit.    Past Medical History:  Diagnosis Date   Anxiety    Aortic atherosclerosis ([place])    Arthritis of right foot    B12 deficiency    Bilateral carotid artery stenosis 03/21/2021   a.) Carotid doppler 03/21/2021: 41-32% pRICA, 44-01% pLICA   CAD (coronary artery disease) 09/09/2007   a.) LHC 09/09/2007: EF 60%; 30% dLM, 25% pLAD, 25% mLAD, 40% oLCx, 50% pLCx, 30% pRI, 100% pRCA with good collateral flow; no interventions opting for med mgmt.   Chronic low back pain    Depression    Diastolic dysfunction 02/72/5366   a.)  TTE 08/16/2021: EF >55%; mild  AR/MR/TR, trivial PR; G1DD.   GERD (gastroesophageal reflux disease)    History of kidney stones    Hyperlipidemia    Hypertension    Murmur    Plantar fasciitis    Pneumonia    Right thyroid nodule 06/15/2021   a.) CTA neck 06/15/2021: measured 49m.   Syncope    T2DM (type 2 diabetes mellitus) ([place]    TIA (transient ischemic attack)     Past Surgical History:  Procedure Laterality Date   CARDIAC CATHETERIZATION     COLONOSCOPY  2011   Polyp removed, follow up in 5 years   COLONOSCOPY WITH PROPOFOL N/A 01/14/2017   Procedure: COLONOSCOPY WITH PROPOFOL;  Surgeon: AJonathon Bellows MD;  [facility];  Service: Endoscopy;  Laterality: N/A;   ENDARTERECTOMY Left 09/06/2021   Procedure: ENDARTERECTOMY CAROTID;  Surgeon: DAlgernon Huxley MD;  Location: [facility] ORS;  Service: Vascular;  Laterality: Left;     Social History   Tobacco Use   Smoking status: Never   Smokeless tobacco: Never  Vaping Use   Vaping Use: Never used  Substance Use Topics   Alcohol use: Not Currently    Comment: on rare occasion   Drug use: Yes    Types: Marijuana      Family History  Problem Relation Age of Onset   Heart disease Mother    Hyperlipidemia Mother  Diabetes Mother    Congestive Heart Failure Mother    Hypertension Mother    Diabetes Father    Heart attack Father    Hyperlipidemia Father      Allergies  Allergen Reactions   Statins Other (See Comments)    fatigue fatigue   Zetia [Ezetimibe] Other (See Comments)    Constipation   Niacin Rash   Niacin And Related Rash     REVIEW OF SYSTEMS (Negative unless checked)  Constitutional: '[]'$ Weight loss  '[]'$ Fever  '[]'$ Chills Cardiac: '[]'$ Chest pain   '[]'$ Chest pressure   '[]'$ Palpitations   '[]'$ Shortness of breath when laying flat   '[]'$ Shortness of breath at rest   '[]'$ Shortness of breath with exertion. Vascular:  '[]'$ Pain in legs with walking   '[]'$ Pain in legs at rest   '[]'$ Pain in legs when laying flat   '[]'$ Claudication   '[]'$ Pain in feet when  walking  '[]'$ Pain in feet at rest  '[]'$ Pain in feet when laying flat   '[]'$ History of DVT   '[]'$ Phlebitis   '[]'$ Swelling in legs   '[]'$ Varicose veins   '[]'$ Non-healing ulcers Pulmonary:   '[]'$ Uses home oxygen   '[]'$ Productive cough   '[]'$ Hemoptysis   '[]'$ Wheeze  '[]'$ COPD   '[]'$ Asthma Neurologic:  '[]'$ Dizziness  '[]'$ Blackouts   '[]'$ Seizures   '[]'$ History of stroke   '[x]'$ History of TIA  '[]'$ Aphasia   '[]'$ Temporary blindness   '[]'$ Dysphagia   '[]'$ Weakness or numbness in arms   '[]'$ Weakness or numbness in legs Musculoskeletal:  '[x]'$ Arthritis   '[]'$ Joint swelling   '[]'$ Joint pain   '[]'$ Low back pain Hematologic:  '[]'$ Easy bruising  '[]'$ Easy bleeding   '[]'$ Hypercoagulable state   '[]'$ Anemic  '[]'$ Hepatitis Gastrointestinal:  '[]'$ Blood in stool   '[]'$ Vomiting blood  '[]'$ Gastroesophageal reflux/heartburn   '[x]'$ Difficulty swallowing. Genitourinary:  '[]'$ Chronic kidney disease   '[]'$ Difficult urination  '[]'$ Frequent urination  '[]'$ Burning with urination   '[]'$ Blood in urine Skin:  '[]'$ Rashes   '[]'$ Ulcers   '[]'$ Wounds Psychological:  '[x]'$ History of anxiety   '[x]'$  History of major depression.  Physical Examination  Vitals:   02/12/22 1518  BP: (!) 197/91  Pulse: 68  Resp: 16  Weight: 151 lb (68.5 kg)   Body mass index is 24.37 kg/m. Gen:  WD/WN, NAD Head: [place]/AT, No temporalis wasting. Ear/Nose/Throat: Hearing grossly intact, nares w/o erythema or drainage, trachea midline Eyes: Conjunctiva clear. Sclera non-icteric Neck: Supple.  No bruit  Pulmonary:  Good air movement, equal and clear to auscultation bilaterally.  Cardiac: RRR, No JVD Vascular:  Vessel Right Left  Radial Palpable Palpable           Musculoskeletal: M/S 5/5 throughout.  No deformity or atrophy. No edema. Neurologic: CN 2-12 intact. Sensation grossly intact in extremities.  Symmetrical.  Speech is fluent. Motor exam as listed above. Psychiatric: Judgment intact, Mood & affect appropriate for pt's clinical situation. Dermatologic: No rashes or ulcers noted.  No cellulitis or open wounds. CEA incision is well  healed.   CBC Lab Results  Component Value Date   WBC 10.2 09/07/2021   HGB 12.2 (L) 09/07/2021   HCT 38.0 (L) 09/07/2021   MCV 91.6 09/07/2021   PLT 176 09/07/2021    BMET    Component Value Date/Time   NA 137 09/07/2021 0405   NA 140 08/02/2021 1515   NA 136 07/19/2012 0615   K 4.2 09/07/2021 0405   K 3.6 07/19/2012 0615   CL 107 09/07/2021 0405   CL 103 07/19/2012 0615   CO2 23 09/07/2021 0405   CO2 26 07/19/2012 0615  GLUCOSE 156 (H) 09/07/2021 0405   GLUCOSE 115 (H) 07/19/2012 0615   BUN 16 09/07/2021 0405   BUN 17 08/02/2021 1515   BUN 17 07/19/2012 0615   CREATININE 1.10 11/05/2021 1408   CREATININE 1.12 07/19/2012 0615   CALCIUM 8.4 (L) 09/07/2021 0405   CALCIUM 7.7 (L) 07/19/2012 0615   GFRNONAA >60 09/07/2021 0405   GFRNONAA >60 07/19/2012 0615   GFRAA 79 02/17/2020 1439   GFRAA >60 07/19/2012 0615   CrCl cannot be calculated (Patient's most recent lab result is older than the maximum 21 days allowed.).  COAG No results found for: "INR", "PROTIME"  Radiology No results found.   Assessment/Plan Carotid stenosis, asymptomatic, bilateral Carotid duplex today reveals a patent left carotid endarterectomy without significant recurrent stenosis.  The velocities on the right side now fall in the 1 to 39% range.  Continue current medical regimen.  He is still having some swallowing and voice issues so I am going to refer him to ENT to see if he may have a vocal cord issue or other problem they may be able to help with.  We will plan on seeing him back with carotid duplex in 6 months.  Hypertension blood pressure control important in reducing the progression of atherosclerotic disease. On appropriate oral medications.   Hyperlipidemia lipid control important in reducing the progression of atherosclerotic disease. Continue statin therapy   Swallowing difficulty Refer to ENT for evaluation    Leotis Pain, MD  02/12/2022 3:57 PM    This note was  created with Dragon medical transcription system.  Any errors from dictation are purely unintentional

## 2022-02-20 DIAGNOSIS — R011 Cardiac murmur, unspecified: Secondary | ICD-10-CM | POA: Diagnosis not present

## 2022-02-20 DIAGNOSIS — M546 Pain in thoracic spine: Secondary | ICD-10-CM | POA: Diagnosis not present

## 2022-02-20 DIAGNOSIS — I2510 Atherosclerotic heart disease of native coronary artery without angina pectoris: Secondary | ICD-10-CM | POA: Diagnosis not present

## 2022-02-20 DIAGNOSIS — I10 Essential (primary) hypertension: Secondary | ICD-10-CM | POA: Diagnosis not present

## 2022-02-20 DIAGNOSIS — E7800 Pure hypercholesterolemia, unspecified: Secondary | ICD-10-CM | POA: Diagnosis not present

## 2022-02-20 DIAGNOSIS — E782 Mixed hyperlipidemia: Secondary | ICD-10-CM | POA: Diagnosis not present

## 2022-03-02 ENCOUNTER — Other Ambulatory Visit: Payer: Self-pay | Admitting: Nurse Practitioner

## 2022-03-05 NOTE — Telephone Encounter (Signed)
Requested Prescriptions  Pending Prescriptions Disp Refills  . rosuvastatin (CRESTOR) 5 MG tablet [Pharmacy Med Name: ROSUVASTATIN CALCIUM 5 MG TAB] 45 tablet 1    Sig: TAKE 1 TABLET BY MOUTH EVERY OTHER DAY     Cardiovascular:  Antilipid - Statins 2 Failed - 03/02/2022  1:16 AM      Failed - Lipid Panel in normal range within the last 12 months    Cholesterol, Total  Date Value Ref Range Status  08/02/2021 225 (H) 100 - 199 mg/dL Final   Cholesterol Piccolo, Waived  Date Value Ref Range Status  09/23/2016 280 (H) <200 mg/dL Final    Comment:                            Desirable                <200                         Borderline High      200- 239                         High                     >239    LDL Chol Calc (NIH)  Date Value Ref Range Status  08/02/2021 151 (H) 0 - 99 mg/dL Final   HDL  Date Value Ref Range Status  08/02/2021 46 >39 mg/dL Final   Triglycerides  Date Value Ref Range Status  08/02/2021 153 (H) 0 - 149 mg/dL Final   Triglycerides Piccolo,Waived  Date Value Ref Range Status  09/23/2016 117 <150 mg/dL Final    Comment:                            Normal                   <150                         Borderline High     150 - 199                         High                200 - 499                         Very High                >499          Passed - Cr in normal range and within 360 days    Creatinine  Date Value Ref Range Status  07/19/2012 1.12 0.60 - 1.30 mg/dL Final   Creatinine, Ser  Date Value Ref Range Status  11/05/2021 1.10 0.61 - 1.24 mg/dL Final         Passed - Patient is not pregnant      Passed - Valid encounter within last 12 months    Recent Outpatient Visits          7 months ago TIA (transient ischemic attack)   [facility], Megan P, DO   10 months ago Neck pain   [place], Megan P, DO   12 months ago Neck pain   [facility] [place], [place], DO   1  year ago Coronary artery disease involving native coronary artery of native heart without angina pectoris   [place], Megan P, DO   2 years ago Routine general medical examination at a health care facility   [facility], [place], [place]

## 2022-03-08 ENCOUNTER — Other Ambulatory Visit: Payer: Self-pay | Admitting: Family Medicine

## 2022-03-11 NOTE — Telephone Encounter (Signed)
Requested medication (s) are due for refill today: yes  Requested medication (s) are on the active medication list: yes  Last refill:  08/02/21 #90/1  Future visit scheduled: no  Notes to clinic:  Unable to refill per protocol due to failed labs, no updated results.     Requested Prescriptions  Pending Prescriptions Disp Refills   valsartan (DIOVAN) 40 MG tablet [Pharmacy Med Name: VALSARTAN 40 MG TABLET] 90 tablet 1    Sig: TAKE 1 TABLET BY MOUTH EVERY DAY     Cardiovascular:  Angiotensin Receptor Blockers Failed - 03/08/2022  2:17 AM      Failed - K in normal range and within 180 days    Potassium  Date Value Ref Range Status  09/07/2021 4.2 3.5 - 5.1 mmol/L Final  07/19/2012 3.6 3.5 - 5.1 mmol/L Final         Failed - Last BP in normal range    BP Readings from Last 1 Encounters:  02/12/22 (!) 197/91         Passed - Cr in normal range and within 180 days    Creatinine  Date Value Ref Range Status  07/19/2012 1.12 0.60 - 1.30 mg/dL Final   Creatinine, Ser  Date Value Ref Range Status  11/05/2021 1.10 0.61 - 1.24 mg/dL Final         Passed - Patient is not pregnant      Passed - Valid encounter within last 6 months    Recent Outpatient Visits           7 months ago TIA (transient ischemic attack)   [facility], Megan P, DO   10 months ago Neck pain   [place], [place], DO   1 year ago Neck pain   [facility] [place], [place], DO   1 year ago Coronary artery disease involving native coronary artery of native heart without angina pectoris   [place], Megan P, DO   2 years ago Routine general medical examination at a health care facility   [facility], [place], DO

## 2022-03-15 ENCOUNTER — Other Ambulatory Visit: Payer: Self-pay | Admitting: Family Medicine

## 2022-03-15 NOTE — Telephone Encounter (Signed)
Requested medications are due for refill today.  yes  Requested medications are on the active medications list.  yes  Last refill. 08/02/2021 #90 1 refill  Future visit scheduled.   no  Notes to clinic.  Pt is over due for OV.    Requested Prescriptions  Pending Prescriptions Disp Refills   valsartan (DIOVAN) 40 MG tablet [Pharmacy Med Name: VALSARTAN 40 MG TABLET] 90 tablet 1    Sig: TAKE 1 TABLET BY MOUTH EVERY DAY     Cardiovascular:  Angiotensin Receptor Blockers Failed - 03/15/2022  2:16 AM      Failed - K in normal range and within 180 days    Potassium  Date Value Ref Range Status  09/07/2021 4.2 3.5 - 5.1 mmol/L Final  07/19/2012 3.6 3.5 - 5.1 mmol/L Final         Failed - Last BP in normal range    BP Readings from Last 1 Encounters:  02/12/22 (!) 197/91         Passed - Cr in normal range and within 180 days    Creatinine  Date Value Ref Range Status  07/19/2012 1.12 0.60 - 1.30 mg/dL Final   Creatinine, Ser  Date Value Ref Range Status  11/05/2021 1.10 0.61 - 1.24 mg/dL Final         Passed - Patient is not pregnant      Passed - Valid encounter within last 6 months    Recent Outpatient Visits           7 months ago TIA (transient ischemic attack)   [facility], Megan P, DO   11 months ago Neck pain   [place], [place], DO   1 year ago Neck pain   [facility] [place], [place], DO   1 year ago Coronary artery disease involving native coronary artery of native heart without angina pectoris   [place], Megan P, DO   2 years ago Routine general medical examination at a health care facility   [facility], [place], DO

## 2022-03-15 NOTE — Telephone Encounter (Signed)
Called pt - [id number] for appt.  

## 2022-03-18 NOTE — Telephone Encounter (Signed)
Pt scheduled 10/2

## 2022-03-21 ENCOUNTER — Encounter: Payer: Self-pay | Admitting: Family Medicine

## 2022-03-21 ENCOUNTER — Ambulatory Visit (INDEPENDENT_AMBULATORY_CARE_PROVIDER_SITE_OTHER): Payer: Medicare Other | Admitting: Family Medicine

## 2022-03-21 VITALS — BP 146/80 | HR 56 | Temp 97.9°F | Wt 153.9 lb

## 2022-03-21 DIAGNOSIS — I10 Essential (primary) hypertension: Secondary | ICD-10-CM

## 2022-03-21 DIAGNOSIS — R200 Anesthesia of skin: Secondary | ICD-10-CM | POA: Diagnosis not present

## 2022-03-21 DIAGNOSIS — I6523 Occlusion and stenosis of bilateral carotid arteries: Secondary | ICD-10-CM | POA: Diagnosis not present

## 2022-03-21 DIAGNOSIS — R7301 Impaired fasting glucose: Secondary | ICD-10-CM

## 2022-03-21 DIAGNOSIS — E782 Mixed hyperlipidemia: Secondary | ICD-10-CM | POA: Diagnosis not present

## 2022-03-21 DIAGNOSIS — R1310 Dysphagia, unspecified: Secondary | ICD-10-CM | POA: Diagnosis not present

## 2022-03-21 LAB — BAYER DCA HB A1C WAIVED: HB A1C (BAYER DCA - WAIVED): 6.3 % — ABNORMAL HIGH (ref 4.8–5.6)

## 2022-03-21 NOTE — Progress Notes (Signed)
BP (!) 146/80   Pulse (!) 56   Temp 97.9 F (36.6 C)   Wt 153 lb 14.4 oz (69.8 kg)   SpO2 98%   BMI 24.84 kg/m    Subjective:    Patient ID: Nathan Fields, male    DOB: 08/16/1950, 71 y.o.   MRN: [id number]  HPI: Nathan Fields is a 71 y.o. male  Chief Complaint  Patient presents with   Choking    Patient states since he's had surgery on his carotid in March, his right side of his throat has been numb, tongue and his right side of the face. Patient struggles with swallowing, talking and at night feels like his throat is closing when he lays down. Patient states he had imaging to rule out stroke but was negative.    Patient presents today for follow up after surgery 6 months ago. He had a high grade carotid stenosis and had a L endarterectomy done 09/06/21. He continued with R high grade carotid stenosis. He had mouth ulcers following surgery and was treated with prednisone which he didn't take due to side effects. At his post-op on 10/16/21 he was found to have altered mental status. He also was biting his L side of his tongue and issues with changes in attitude and temperament. No TIA symptoms at that time. They referred him to neurology and got him a CT of his head. This was not done until 11/05/21 and was normal except for chronic microvascular ischemia. He saw Dr. Sherryll Burger on 12/17/21. He ordered an MRI of his head, got him into speech therapy and started him on atropine drops for the drooling. He had his MRI done on 12/26/21 which was also normal except for chronic microvascular ischemia. He again saw vascular on 02/12/22. He was referred to ENT at that time. He saw his cardiologist on 02/20/22. No changes at that time. He has not seen the ENT yet. He notes that he has a lot of issues with tingling in his face. He has been drooling and has been having trouble swallowing.   HYPERTENSION / HYPERLIPIDEMIA Satisfied with current treatment? yes Duration of hypertension: chronic BP monitoring  frequency: not checking BP medication side effects: no Past BP meds: amlodipine, valsartan Duration of hyperlipidemia: chronic Cholesterol medication side effects: no Cholesterol supplements: none Past cholesterol medications: crestor Medication compliance: excellent compliance Aspirin: no Recent stressors: yes Recurrent headaches: no Visual changes: no Palpitations: no Dyspnea: no Chest pain: no Lower extremity edema: no Dizzy/lightheaded: no  Relevant past medical, surgical, family and social history reviewed and updated as indicated. Interim medical history since our last visit reviewed. Allergies and medications reviewed and updated.  Review of Systems  Constitutional: Negative.   HENT:  Positive for drooling, sore throat and trouble swallowing. Negative for congestion, dental problem, ear discharge, ear pain, facial swelling, hearing loss, mouth sores, nosebleeds, postnasal drip, rhinorrhea, sinus pressure, sinus pain, sneezing, tinnitus and voice change.   Respiratory: Negative.    Cardiovascular: Negative.   Gastrointestinal: Negative.   Musculoskeletal: Negative.   Neurological:  Positive for numbness. Negative for dizziness, tremors, seizures, syncope, facial asymmetry, speech difficulty, weakness, light-headedness and headaches.  Psychiatric/Behavioral: Negative.      Per HPI unless specifically indicated above     Objective:    BP (!) 146/80   Pulse (!) 56   Temp 97.9 F (36.6 C)   Wt 153 lb 14.4 oz (69.8 kg)   SpO2 98%   BMI 24.84 kg/m  Wt Readings from Last 3 Encounters:  03/21/22 153 lb 14.4 oz (69.8 kg)  02/12/22 151 lb (68.5 kg)  11/16/21 160 lb (72.6 kg)    Physical Exam Vitals and nursing note reviewed.  Constitutional:      General: He is not in acute distress.    Appearance: Normal appearance. He is normal weight. He is not ill-appearing, toxic-appearing or diaphoretic.  HENT:     Head: Normocephalic and atraumatic.     Right Ear: External  ear normal.     Left Ear: External ear normal.     Nose: Nose normal.     Mouth/Throat:     Mouth: Mucous membranes are moist.     Pharynx: Oropharynx is clear.  Eyes:     General: No scleral icterus.       Right eye: No discharge.        Left eye: No discharge.     Extraocular Movements: Extraocular movements intact.     Conjunctiva/sclera: Conjunctivae normal.     Pupils: Pupils are equal, round, and reactive to light.  Cardiovascular:     Rate and Rhythm: Normal rate and regular rhythm.     Pulses: Normal pulses.     Heart sounds: Normal heart sounds. No murmur heard.    No friction rub. No gallop.  Pulmonary:     Effort: Pulmonary effort is normal. No respiratory distress.     Breath sounds: Normal breath sounds. No stridor. No wheezing, rhonchi or rales.  Chest:     Chest wall: No tenderness.  Musculoskeletal:        General: Normal range of motion.     Cervical back: Normal range of motion and neck supple.  Skin:    General: Skin is warm and dry.     Capillary Refill: Capillary refill takes less than 2 seconds.     Coloration: Skin is not jaundiced or pale.     Findings: No bruising, erythema, lesion or rash.  Neurological:     General: No focal deficit present.     Mental Status: He is alert and oriented to person, place, and time. Mental status is at baseline.  Psychiatric:        Mood and Affect: Mood normal.        Behavior: Behavior normal.        Thought Content: Thought content normal.        Judgment: Judgment normal.     Results for orders placed or performed in visit on 03/21/22  Comprehensive metabolic panel  Result Value Ref Range   Glucose 89 70 - 99 mg/dL   BUN 14 8 - 27 mg/dL   Creatinine, Ser 1.05 0.76 - 1.27 mg/dL   eGFR 76 >59 mL/min/1.73   BUN/Creatinine Ratio 13 10 - 24   Sodium 138 134 - 144 mmol/L   Potassium 4.2 3.5 - 5.2 mmol/L   Chloride 101 96 - 106 mmol/L   CO2 23 20 - 29 mmol/L   Calcium 9.9 8.6 - 10.2 mg/dL   Total Protein 6.9  6.0 - 8.5 g/dL   Albumin 4.6 3.9 - 4.9 g/dL   Globulin, Total 2.3 1.5 - 4.5 g/dL   Albumin/Globulin Ratio 2.0 1.2 - 2.2   Bilirubin Total 0.4 0.0 - 1.2 mg/dL   Alkaline Phosphatase 97 44 - 121 IU/L   AST 18 0 - 40 IU/L   ALT 16 0 - 44 IU/L  CBC with Differential/Platelet  Result Value Ref Range   WBC  6.0 3.4 - 10.8 x10E3/uL   RBC 4.90 4.14 - 5.80 x10E6/uL   Hemoglobin 14.5 13.0 - 17.7 g/dL   Hematocrit 42.6 37.5 - 51.0 %   MCV 87 79 - 97 fL   MCH 29.6 26.6 - 33.0 pg   MCHC 34.0 31.5 - 35.7 g/dL   RDW 12.6 11.6 - 15.4 %   Platelets 203 150 - 450 x10E3/uL   Neutrophils 57 Not Estab. %   Lymphs 32 Not Estab. %   Monocytes 8 Not Estab. %   Eos 2 Not Estab. %   Basos 1 Not Estab. %   Neutrophils Absolute 3.5 1.4 - 7.0 x10E3/uL   Lymphocytes Absolute 1.9 0.7 - 3.1 x10E3/uL   Monocytes Absolute 0.5 0.1 - 0.9 x10E3/uL   EOS (ABSOLUTE) 0.1 0.0 - 0.4 x10E3/uL   Basophils Absolute 0.1 0.0 - 0.2 x10E3/uL   Immature Granulocytes 0 Not Estab. %   Immature Grans (Abs) 0.0 0.0 - 0.1 x10E3/uL  Lipid Panel w/o Chol/HDL Ratio  Result Value Ref Range   Cholesterol, Total 195 100 - 199 mg/dL   Triglycerides 108 0 - 149 mg/dL   HDL 51 >39 mg/dL   VLDL Cholesterol Cal 19 5 - 40 mg/dL   LDL Chol Calc (NIH) 125 (H) 0 - 99 mg/dL  TSH  Result Value Ref Range   TSH 1.760 0.450 - 4.500 uIU/mL  Bayer DCA Hb A1c Waived  Result Value Ref Range   HB A1C (BAYER DCA - WAIVED) 6.3 (H) 4.8 - 5.6 %      Assessment & Plan:   Problem List Items Addressed This Visit       Cardiovascular and Mediastinum   Hypertension    Under good control on current regimen. Continue current regimen. Continue to monitor. Call with any concerns. Refills given. Labs drawn today.        Relevant Medications   valsartan (DIOVAN) 40 MG tablet   amLODipine (NORVASC) 2.5 MG tablet   Other Relevant Orders   Comprehensive metabolic panel (Completed)   CBC with Differential/Platelet (Completed)   TSH (Completed)    Carotid stenosis    Continues to follow with vascular. Continue medical management.       Relevant Medications   valsartan (DIOVAN) 40 MG tablet   amLODipine (NORVASC) 2.5 MG tablet     Digestive   Swallowing difficulty    Has been having trouble since his surgery. Will get him into speech therapy. Likely needs swallow study. Referral to GI also placed.       Relevant Orders   Ambulatory referral to Gastroenterology   Ambulatory referral to Speech Therapy     Endocrine   Impaired fasting glucose   Relevant Orders   Comprehensive metabolic panel (Completed)   CBC with Differential/Platelet (Completed)   Bayer DCA Hb A1c Waived (Completed)     Other   Hyperlipidemia - Primary    Under good control on current regimen. Continue current regimen. Continue to monitor. Call with any concerns. Refills given. Labs drawn today.        Relevant Medications   valsartan (DIOVAN) 40 MG tablet   amLODipine (NORVASC) 2.5 MG tablet   Other Relevant Orders   Comprehensive metabolic panel (Completed)   CBC with Differential/Platelet (Completed)   Lipid Panel w/o Chol/HDL Ratio (Completed)   Facial numbness    Has been having trouble since his surgery. Will get him into speech therapy. Referral to ENT also placed per patient request.  Relevant Orders   Ambulatory referral to ENT   Ambulatory referral to Speech Therapy     Follow up plan: Return in about 4 weeks (around 04/18/2022).

## 2022-03-22 LAB — COMPREHENSIVE METABOLIC PANEL
ALT: 16 IU/L (ref 0–44)
AST: 18 IU/L (ref 0–40)
Albumin/Globulin Ratio: 2 (ref 1.2–2.2)
Albumin: 4.6 g/dL (ref 3.9–4.9)
Alkaline Phosphatase: 97 IU/L (ref 44–121)
BUN/Creatinine Ratio: 13 (ref 10–24)
BUN: 14 mg/dL (ref 8–27)
Bilirubin Total: 0.4 mg/dL (ref 0.0–1.2)
CO2: 23 mmol/L (ref 20–29)
Calcium: 9.9 mg/dL (ref 8.6–10.2)
Chloride: 101 mmol/L (ref 96–106)
Creatinine, Ser: 1.05 mg/dL (ref 0.76–1.27)
Globulin, Total: 2.3 g/dL (ref 1.5–4.5)
Glucose: 89 mg/dL (ref 70–99)
Potassium: 4.2 mmol/L (ref 3.5–5.2)
Sodium: 138 mmol/L (ref 134–144)
Total Protein: 6.9 g/dL (ref 6.0–8.5)
eGFR: 76 mL/min/{1.73_m2} (ref >59)

## 2022-03-22 LAB — CBC WITH DIFFERENTIAL/PLATELET
Basophils Absolute: 0.1 10*3/uL (ref 0.0–0.2)
Basos: 1 %
EOS (ABSOLUTE): 0.1 10*3/uL (ref 0.0–0.4)
Eos: 2 %
Hematocrit: 42.6 % (ref 37.5–51.0)
Hemoglobin: 14.5 g/dL (ref 13.0–17.7)
Immature Grans (Abs): 0 10*3/uL (ref 0.0–0.1)
Immature Granulocytes: 0 %
Lymphocytes Absolute: 1.9 10*3/uL (ref 0.7–3.1)
Lymphs: 32 %
MCH: 29.6 pg (ref 26.6–33.0)
MCHC: 34 g/dL (ref 31.5–35.7)
MCV: 87 fL (ref 79–97)
Monocytes Absolute: 0.5 10*3/uL (ref 0.1–0.9)
Monocytes: 8 %
Neutrophils Absolute: 3.5 10*3/uL (ref 1.4–7.0)
Neutrophils: 57 %
Platelets: 203 10*3/uL (ref 150–450)
RBC: 4.9 x10E6/uL (ref 4.14–5.80)
RDW: 12.6 % (ref 11.6–15.4)
WBC: 6 10*3/uL (ref 3.4–10.8)

## 2022-03-22 LAB — LIPID PANEL W/O CHOL/HDL RATIO
Cholesterol, Total: 195 mg/dL (ref 100–199)
HDL: 51 mg/dL (ref >39)
LDL Chol Calc (NIH): 125 mg/dL — ABNORMAL HIGH (ref 0–99)
Triglycerides: 108 mg/dL (ref 0–149)
VLDL Cholesterol Cal: 19 mg/dL (ref 5–40)

## 2022-03-22 LAB — TSH: TSH: 1.76 u[IU]/mL (ref 0.450–4.500)

## 2022-03-27 MED ORDER — AMLODIPINE BESYLATE 2.5 MG PO TABS: 2.5000 mg | ORAL_TABLET | Freq: Every day | ORAL | 1 refills | Status: DC

## 2022-03-27 MED ORDER — VALSARTAN 40 MG PO TABS
40.0000 mg | ORAL_TABLET | Freq: Every day | ORAL | 1 refills | Status: DC
Start: 2022-03-27 — End: 2022-11-06

## 2022-03-27 NOTE — Assessment & Plan Note (Signed)
Under good control on current regimen. Continue current regimen. Continue to monitor. Call with any concerns. Refills given. Labs drawn today.   

## 2022-03-27 NOTE — Assessment & Plan Note (Signed)
Has been having trouble since his surgery. Will get him into speech therapy. Likely needs swallow study. Referral to GI also placed.

## 2022-03-27 NOTE — Assessment & Plan Note (Signed)
Continues to follow with vascular. Continue medical management.

## 2022-03-27 NOTE — Assessment & Plan Note (Signed)
Has been having trouble since his surgery. Will get him into speech therapy. Referral to ENT also placed per patient request.

## 2022-04-01 ENCOUNTER — Ambulatory Visit: Payer: Medicare Other | Admitting: Family Medicine

## 2022-04-18 ENCOUNTER — Ambulatory Visit: Payer: Medicare Other | Admitting: Family Medicine

## 2022-04-26 ENCOUNTER — Telehealth: Payer: Self-pay | Admitting: *Deleted

## 2022-04-26 NOTE — Telephone Encounter (Signed)
Patient's wife called office. She was transferred to my phone and left a voicemail.  Patient's wife did mention colonoscopy which patient is due for. However, after looking in the chart. Patient's pcp sent a referral for Dysphagia which mean patient needs an office visit.  Noted: Patient was seen by Dr Vicente Males on 12/17/2016 for rectal bleeding. Patient had a colonoscopy on 01/14/2017.  Please call patient's wife to schedule an appointment in the office for this new problem.

## 2022-04-29 ENCOUNTER — Encounter (INDEPENDENT_AMBULATORY_CARE_PROVIDER_SITE_OTHER): Payer: Self-pay

## 2022-05-16 DIAGNOSIS — J3801 Paralysis of vocal cords and larynx, unilateral: Secondary | ICD-10-CM | POA: Diagnosis not present

## 2022-05-16 DIAGNOSIS — K117 Disturbances of salivary secretion: Secondary | ICD-10-CM | POA: Diagnosis not present

## 2022-05-16 DIAGNOSIS — R201 Hypoesthesia of skin: Secondary | ICD-10-CM | POA: Diagnosis not present

## 2022-05-16 DIAGNOSIS — R1313 Dysphagia, pharyngeal phase: Secondary | ICD-10-CM | POA: Diagnosis not present

## 2022-08-16 ENCOUNTER — Other Ambulatory Visit (INDEPENDENT_AMBULATORY_CARE_PROVIDER_SITE_OTHER): Payer: Self-pay | Admitting: Vascular Surgery

## 2022-08-16 DIAGNOSIS — I6523 Occlusion and stenosis of bilateral carotid arteries: Secondary | ICD-10-CM

## 2022-08-20 ENCOUNTER — Encounter (INDEPENDENT_AMBULATORY_CARE_PROVIDER_SITE_OTHER): Payer: Self-pay | Admitting: Vascular Surgery

## 2022-08-20 ENCOUNTER — Ambulatory Visit (INDEPENDENT_AMBULATORY_CARE_PROVIDER_SITE_OTHER): Payer: Medicare Other | Admitting: Vascular Surgery

## 2022-08-20 ENCOUNTER — Ambulatory Visit (INDEPENDENT_AMBULATORY_CARE_PROVIDER_SITE_OTHER): Payer: Medicare Other

## 2022-08-20 VITALS — BP 176/78 | HR 62 | Resp 16 | Wt 157.4 lb

## 2022-08-20 DIAGNOSIS — E782 Mixed hyperlipidemia: Secondary | ICD-10-CM

## 2022-08-20 DIAGNOSIS — I6523 Occlusion and stenosis of bilateral carotid arteries: Secondary | ICD-10-CM | POA: Diagnosis not present

## 2022-08-20 DIAGNOSIS — I10 Essential (primary) hypertension: Secondary | ICD-10-CM

## 2022-08-20 NOTE — Progress Notes (Signed)
MRN : [id number]  Nathan Fields is a 72 y.o. (10/07/1950) male who presents with chief complaint of  Chief Complaint  Patient presents with   Follow-up    Ultrasound follow up  .  History of Present Illness: Patient returns in follow-up of his carotid disease.  He is almost a year status post left carotid endarterectomy.  He is doing well with no major issues since his last visit.  No focal neurologic symptoms since his last visit. Specifically, the patient denies amaurosis fugax, speech or swallowing difficulties, or arm or leg weakness or numbness Carotid duplex today reveals a widely patent left carotid endarterectomy with stable 1 to 39% right ICA stenosis.  Current Outpatient Medications  Medication Sig Dispense Refill   amLODipine (NORVASC) 2.5 MG tablet Take 1 tablet (2.5 mg total) by mouth daily. 90 tablet 1   aspirin 81 MG tablet Take 81 mg by mouth daily.     rosuvastatin (CRESTOR) 5 MG tablet TAKE 1 TABLET BY MOUTH EVERY OTHER DAY 45 tablet 1   valsartan (DIOVAN) 40 MG tablet Take 1 tablet (40 mg total) by mouth daily. 90 tablet 1   No current facility-administered medications for this visit.    Past Medical History:  Diagnosis Date   Anxiety    Aortic atherosclerosis ([place])    Arthritis of right foot    B12 deficiency    Bilateral carotid artery stenosis 03/21/2021   a.) Carotid doppler 03/21/2021: 0000000 pRICA, 99991111 pLICA   CAD (coronary artery disease) 09/09/2007   a.) LHC 09/09/2007: EF 60%; 30% dLM, 25% pLAD, 25% mLAD, 40% oLCx, 50% pLCx, 30% pRI, 100% pRCA with good collateral flow; no interventions opting for med mgmt.   Chronic low back pain    Depression    Diastolic dysfunction 0000000   a.)  TTE 08/16/2021: EF >55%; mild AR/MR/TR, trivial PR; G1DD.   GERD (gastroesophageal reflux disease)    History of kidney stones    Hyperlipidemia    Hypertension    Murmur    Plantar fasciitis    Pneumonia    Right thyroid nodule 06/15/2021   a.) CTA neck  06/15/2021: measured 65m.   Syncope    T2DM (type 2 diabetes mellitus) [place]    TIA (transient ischemic attack)     Past Surgical History:  Procedure Laterality Date   CARDIAC CATHETERIZATION     COLONOSCOPY  2011   Polyp removed, follow up in 5 years   COLONOSCOPY WITH PROPOFOL N/A 01/14/2017   Procedure: COLONOSCOPY WITH PROPOFOL;  Surgeon: AJonathon Bellows MD;  [facility];  Service: Endoscopy;  Laterality: N/A;   ENDARTERECTOMY Left 09/06/2021   Procedure: ENDARTERECTOMY CAROTID;  Surgeon: DAlgernon Huxley MD;  Location: [facility] ORS;  Service: Vascular;  Laterality: Left;     Social History   Tobacco Use   Smoking status: Never   Smokeless tobacco: Never  Vaping Use   Vaping Use: Never used  Substance Use Topics   Alcohol use: Not Currently    Comment: on rare occasion   Drug use: Yes    Types: Marijuana       Family History  Problem Relation Age of Onset   Heart disease Mother    Hyperlipidemia Mother    Diabetes Mother    Congestive Heart Failure Mother    Hypertension Mother    Diabetes Father    Heart attack Father    Hyperlipidemia Father      Allergies  Allergen Reactions  Statins Other (See Comments)    fatigue fatigue   Zetia [Ezetimibe] Other (See Comments)    Constipation   Niacin Rash   Niacin And Related Rash    REVIEW OF SYSTEMS (Negative unless checked)   Constitutional: []$ Weight loss  []$ Fever  []$ Chills Cardiac: []$ Chest pain   []$ Chest pressure   []$ Palpitations   []$ Shortness of breath when laying flat   []$ Shortness of breath at rest   []$ Shortness of breath with exertion. Vascular:  []$ Pain in legs with walking   []$ Pain in legs at rest   []$ Pain in legs when laying flat   []$ Claudication   []$ Pain in feet when walking  []$ Pain in feet at rest  []$ Pain in feet when laying flat   []$ History of DVT   []$ Phlebitis   []$ Swelling in legs   []$ Varicose veins   []$ Non-healing ulcers Pulmonary:   []$ Uses home oxygen   []$ Productive cough   []$ Hemoptysis    []$ Wheeze  []$ COPD   []$ Asthma Neurologic:  []$ Dizziness  []$ Blackouts   []$ Seizures   []$ History of stroke   [x]$ History of TIA  []$ Aphasia   []$ Temporary blindness   []$ Dysphagia   []$ Weakness or numbness in arms   []$ Weakness or numbness in legs Musculoskeletal:  [x]$ Arthritis   []$ Joint swelling   []$ Joint pain   []$ Low back pain Hematologic:  []$ Easy bruising  []$ Easy bleeding   []$ Hypercoagulable state   []$ Anemic  []$ Hepatitis Gastrointestinal:  []$ Blood in stool   []$ Vomiting blood  []$ Gastroesophageal reflux/heartburn   [x]$ Difficulty swallowing. Genitourinary:  []$ Chronic kidney disease   []$ Difficult urination  []$ Frequent urination  []$ Burning with urination   []$ Blood in urine Skin:  []$ Rashes   []$ Ulcers   []$ Wounds Psychological:  [x]$ History of anxiety   [x]$  History of major depression.  Physical Examination  Vitals:   08/20/22 1555  BP: (!) 176/78  Pulse: 62  Resp: 16  Weight: 157 lb 6.4 oz (71.4 kg)   Body mass index is 25.41 kg/m. Gen:  WD/WN, NAD Head: [place]/AT, No temporalis wasting. Ear/Nose/Throat: Hearing grossly intact, nares w/o erythema or drainage, trachea midline Eyes: Conjunctiva clear. Sclera non-icteric Neck: Supple.  No bruit  Pulmonary:  Good air movement, equal and clear to auscultation bilaterally.  Cardiac: RRR, No JVD Vascular:  Vessel Right Left  Radial Palpable Palpable           Musculoskeletal: M/S 5/5 throughout.  No deformity or atrophy. No edema. Neurologic: CN 2-12 intact. Sensation grossly intact in extremities.  Symmetrical.  Speech is fluent. Motor exam as listed above. Psychiatric: Judgment intact, Mood & affect appropriate for pt's clinical situation. Dermatologic: No rashes or ulcers noted.  No cellulitis or open wounds.    CBC Lab Results  Component Value Date   WBC 6.0 03/21/2022   HGB 14.5 03/21/2022   HCT 42.6 03/21/2022   MCV 87 03/21/2022   PLT 203 03/21/2022    BMET    Component Value Date/Time   NA 138 03/21/2022 1643   NA 136 07/19/2012  0615   K 4.2 03/21/2022 1643   K 3.6 07/19/2012 0615   CL 101 03/21/2022 1643   CL 103 07/19/2012 0615   CO2 23 03/21/2022 1643   CO2 26 07/19/2012 0615   GLUCOSE 89 03/21/2022 1643   GLUCOSE 156 (H) 09/07/2021 0405   GLUCOSE 115 (H) 07/19/2012 0615   BUN 14 03/21/2022 1643   BUN 17 07/19/2012 0615   CREATININE 1.05 03/21/2022 1643   CREATININE 1.12 07/19/2012 0615   CALCIUM 9.9 03/21/2022 1643  CALCIUM 7.7 (L) 07/19/2012 0615   GFRNONAA >60 09/07/2021 0405   GFRNONAA >60 07/19/2012 0615   GFRAA 79 02/17/2020 1439   GFRAA >60 07/19/2012 0615   CrCl cannot be calculated (Patient's most recent lab result is older than the maximum 21 days allowed.).  COAG No results found for: "INR", "PROTIME"  Radiology No results found.   Assessment/Plan Carotid stenosis, asymptomatic, bilateral Carotid duplex today reveals a patent left carotid endarterectomy without significant recurrent stenosis.  The velocities on the right side now fall in the 1 to 39% range.  Continue current medical regimen.  RTC in one year with duplex   Hypertension blood pressure control important in reducing the progression of atherosclerotic disease. On appropriate oral medications.     Hyperlipidemia lipid control important in reducing the progression of atherosclerotic disease. Continue statin therapy   Leotis Pain, MD  08/20/2022 4:17 PM    This note was created with Dragon medical transcription system.  Any errors from dictation are purely unintentional

## 2022-08-24 ENCOUNTER — Other Ambulatory Visit: Payer: Self-pay | Admitting: Family Medicine

## 2022-08-26 NOTE — Telephone Encounter (Signed)
Requested Prescriptions  Pending Prescriptions Disp Refills   rosuvastatin (CRESTOR) 5 MG tablet [Pharmacy Med Name: ROSUVASTATIN CALCIUM 5 MG TAB] 45 tablet 1    Sig: TAKE 1 TABLET BY MOUTH EVERY OTHER DAY     Cardiovascular:  Antilipid - Statins 2 Failed - 08/24/2022 10:24 PM      Failed - Lipid Panel in normal range within the last 12 months    Cholesterol, Total  Date Value Ref Range Status  03/21/2022 195 100 - 199 mg/dL Final   Cholesterol Piccolo, Waived  Date Value Ref Range Status  09/23/2016 280 (H) <200 mg/dL Final    Comment:                            Desirable                <200                         Borderline High      200- 239                         High                     >239    LDL Chol Calc (NIH)  Date Value Ref Range Status  03/21/2022 125 (H) 0 - 99 mg/dL Final   HDL  Date Value Ref Range Status  03/21/2022 51 >39 mg/dL Final   Triglycerides  Date Value Ref Range Status  03/21/2022 108 0 - 149 mg/dL Final   Triglycerides Piccolo,Waived  Date Value Ref Range Status  09/23/2016 117 <150 mg/dL Final    Comment:                            Normal                   <150                         Borderline High     150 - 199                         High                200 - 499                         Very High                >499          Passed - Cr in normal range and within 360 days    Creatinine  Date Value Ref Range Status  07/19/2012 1.12 0.60 - 1.30 mg/dL Final   Creatinine, Ser  Date Value Ref Range Status  03/21/2022 1.05 0.76 - 1.27 mg/dL Final         Passed - Patient is not pregnant      Passed - Valid encounter within last 12 months    Recent Outpatient Visits           5 months ago Mixed hyperlipidemia   [place], Megan P, DO   1 year ago TIA (transient ischemic attack)   [facility] [place], Hanna P, DO   1 year ago Neck pain   [place] [facility] [place], [place], DO   1 year ago Neck pain   [place] [facility] [place], [place] P, DO   1 year ago Coronary artery disease involving native coronary artery of native heart without angina pectoris   [place], [place], [place]

## 2022-08-29 ENCOUNTER — Encounter: Payer: Self-pay | Admitting: Gastroenterology

## 2022-08-29 ENCOUNTER — Other Ambulatory Visit: Payer: Self-pay

## 2022-08-29 ENCOUNTER — Ambulatory Visit (INDEPENDENT_AMBULATORY_CARE_PROVIDER_SITE_OTHER): Payer: Medicare Other | Admitting: Gastroenterology

## 2022-08-29 VITALS — BP 184/87 | HR 69 | Temp 98.3°F | Ht 66.0 in | Wt 161.6 lb

## 2022-08-29 DIAGNOSIS — R1319 Other dysphagia: Secondary | ICD-10-CM | POA: Diagnosis not present

## 2022-08-29 DIAGNOSIS — Z860100 Personal history of colon polyps, unspecified: Secondary | ICD-10-CM

## 2022-08-29 DIAGNOSIS — R6339 Other feeding difficulties: Secondary | ICD-10-CM

## 2022-08-29 DIAGNOSIS — I6523 Occlusion and stenosis of bilateral carotid arteries: Secondary | ICD-10-CM | POA: Diagnosis not present

## 2022-08-29 MED ORDER — NA SULFATE-K SULFATE-MG SULF 17.5-3.13-1.6 GM/177ML PO SOLN: 354.0000 mL | Freq: Once | ORAL | 0 refills | Status: AC

## 2022-08-29 NOTE — Progress Notes (Signed)
Nathan Bellows MD, MRCP(U.K) [address]  [place]  [place], [place] [address]  Main: [phone]  Fax: [phone]   Gastroenterology Consultation  Referring Provider:     Valerie Roys, DO Primary Care Physician:  Nathan Roys, DO Primary Gastroenterologist:  Dr. Jonathon Fields  Reason for Consultation:     Dysphagia and colon cancer screening colonoscopy        HPI:   Nathan Fields is a 72 y.o. y/o male referred for consultation & management  by Dr. Wynetta Fields, Nathan P, DO.    He says that sometime back he had bilateral carotid surgery and since then he has been having difficulty swallowing.  He states that the food does not go down affect solids and liquids he is also lost some weight.  Never had an endoscopy.  He has had polyps in the past and is due for a surveillance colonoscopy.  He denies any coughing but he has lost sensation over his tongue since the surgery and he cannot taste certain foods. 03/21/2022 hemoglobin 14.5  Past Medical History:  Diagnosis Date   Anxiety    Aortic atherosclerosis ([place])    Arthritis of right foot    B12 deficiency    Bilateral carotid artery stenosis 03/21/2021   a.) Carotid doppler 03/21/2021: 0000000 pRICA, 99991111 pLICA   CAD (coronary artery disease) 09/09/2007   a.) LHC 09/09/2007: EF 60%; 30% dLM, 25% pLAD, 25% mLAD, 40% oLCx, 50% pLCx, 30% pRI, 100% pRCA with good collateral flow; no interventions opting for med mgmt.   Chronic low back pain    Depression    Diastolic dysfunction 0000000   a.)  TTE 08/16/2021: EF >55%; mild AR/MR/TR, trivial PR; G1DD.   GERD (gastroesophageal reflux disease)    History of kidney stones    Hyperlipidemia    Hypertension    Murmur    Plantar fasciitis    Pneumonia    Right thyroid nodule 06/15/2021   a.) CTA neck 06/15/2021: measured 43m.   Syncope    T2DM (type 2 diabetes mellitus) ([place]    TIA (transient ischemic attack)     Past Surgical History:  Procedure Laterality Date    CARDIAC CATHETERIZATION     COLONOSCOPY  2011   Polyp removed, follow up in 5 years   COLONOSCOPY WITH PROPOFOL N/A 01/14/2017   Procedure: COLONOSCOPY WITH PROPOFOL;  Surgeon: AJonathon Bellows MD;  [facility];  Service: Endoscopy;  Laterality: N/A;   ENDARTERECTOMY Left 09/06/2021   Procedure: ENDARTERECTOMY CAROTID;  Surgeon: DAlgernon Huxley MD;  Location: [facility];  Service: Vascular;  Laterality: Left;    Prior to Admission medications   Medication Sig Start Date End Date Taking? Authorizing Provider  amLODipine (NORVASC) 2.5 MG tablet Take 1 tablet (2.5 mg total) by mouth daily. 03/27/22   JPark LiterP, DO  aspirin 81 MG tablet Take 81 mg by mouth daily.    [provider]  rosuvastatin (CRESTOR) 5 MG tablet TAKE 1 TABLET BY MOUTH EVERY OTHER DAY 08/26/22   JPark LiterP, DO  valsartan (DIOVAN) 40 MG tablet Take 1 tablet (40 mg total) by mouth daily. 03/27/22   JValerie Roys DO    Family History  Problem Relation Age of Onset   Heart disease Mother    Hyperlipidemia Mother    Diabetes Mother    Congestive Heart Failure Mother    Hypertension Mother    Diabetes Father    Heart attack Father  Hyperlipidemia Father      Social History   Tobacco Use   Smoking status: Never   Smokeless tobacco: Never  Vaping Use   Vaping Use: Never used  Substance Use Topics   Alcohol use: Not Currently    Comment: on rare occasion   Drug use: Yes    Types: Marijuana    Allergies as of 08/29/2022 - Review Complete 08/20/2022  Allergen Reaction Noted   Statins Other (See Comments) 02/05/2016   Zetia [ezetimibe] Other (See Comments) 02/05/2016   Niacin Rash 11/11/2016   Niacin and related Rash 11/11/2016    Review of Systems:    All systems reviewed and negative except where noted in HPI.   Physical Exam:  BP (!) 184/87   Pulse 69   Temp 98.3 F (36.8 C) (Oral)   Ht '5\' 6"'$  (1.676 m)   Wt 161 lb 9.6 oz (73.3 kg)   BMI 26.08 kg/m  No LMP for male  patient. Psych:  Alert and cooperative. Normal mood and affect. General:   Alert,  Well-developed, well-nourished, pleasant and cooperative in NAD Head:  Normocephalic and atraumatic. Eyes:  Sclera clear, no icterus.   Conjunctiva pink. Ears:  Normal auditory acuity.  Neurologic:  Alert and oriented x3;  grossly normal neurologically. Psych:  Alert and cooperative. Normal mood and affect. Vitals:   08/29/22 1313  BP: (!) 184/87  Pulse: 69  Temp: 98.3 F (36.8 C)    Imaging Studies: VAS US CAROTID  Result Date: 08/21/2022 Carotid Arterial Duplex Study Patient Name:  Nathan Fields  Date of Exam:   08/20/2022 Medical Rec #: [id number]         Accession #:    [id number] Date of Birth: 26-Jan-1951          Patient Gender: M Patient Age:   34 years Exam Location:  [facility] Procedure:      VAS US CAROTID Referring Phys: Leotis Pain --------------------------------------------------------------------------------  Indications:   Carotid artery disease. Other Factors: Left carotid endarterectomy on 09/06/21. Performing Technologist: Blondell Reveal RT, RDMS, RVT  Examination Guidelines: A complete evaluation includes B-mode imaging, spectral Doppler, color Doppler, and power Doppler as needed of all accessible portions of each vessel. Bilateral testing is considered an integral part of a complete examination. Limited examinations for reoccurring indications may be performed as noted.  Right Carotid Findings: +----------+--------+--------+--------+---------------------+------------------+           PSV cm/sEDV cm/sStenosisPlaque Description   Comments           +----------+--------+--------+--------+---------------------+------------------+ CCA Prox  82      12                                                      +----------+--------+--------+--------+---------------------+------------------+ CCA Mid   79      16                                                       +----------+--------+--------+--------+---------------------+------------------+ CCA Distal77      18  intimal thickening +----------+--------+--------+--------+---------------------+------------------+ ICA Prox  112     32      1-39%   calcific and                                                              irregular                               +----------+--------+--------+--------+---------------------+------------------+ ICA Mid   111     35                                                      +----------+--------+--------+--------+---------------------+------------------+ ICA Distal101     24                                                      +----------+--------+--------+--------+---------------------+------------------+ ECA       80      8                                    intimal thickening +----------+--------+--------+--------+---------------------+------------------+ +----------+--------+-------+----------------+-------------------+           PSV cm/sEDV cmsDescribe        Arm Pressure (mmHG) +----------+--------+-------+----------------+-------------------+ [id number]            Multiphasic, WNL                    +----------+--------+-------+----------------+-------------------+ +---------+--------+--+--------+---------+ VertebralPSV cm/s33EDV cm/sAntegrade +---------+--------+--+--------+---------+  Left Carotid Findings: +----------+------+--------+--------+------------------+------------------+           PScm/sEDV cm/sStenosisPlaque DescriptionComments           +----------+------+--------+--------+------------------+------------------+ CCA Prox  81    14                                                   +----------+------+--------+--------+------------------+------------------+ CCA Mid   87    19                                intimal thickening  +----------+------+--------+--------+------------------+------------------+ CCA Distal99    17                                intimal thickening +----------+------+--------+--------+------------------+------------------+ ICA Prox  71    18                                                   +----------+------+--------+--------+------------------+------------------+ ICA Mid   79    23                                                   +----------+------+--------+--------+------------------+------------------+  ICA Distal76    23                                                   +----------+------+--------+--------+------------------+------------------+ ECA       129   17                                                   +----------+------+--------+--------+------------------+------------------+ +----------+--------+--------+----------------+-------------------+           PSV cm/sEDV cm/sDescribe        Arm Pressure (mmHG) +----------+--------+--------+----------------+-------------------+ [id number]             Multiphasic, WNL                    +----------+--------+--------+----------------+-------------------+ +---------+--------+--+--------+---------+ VertebralPSV cm/s57EDV cm/sAntegrade +---------+--------+--+--------+---------+  Summary: Right Carotid: Velocities in the right ICA are consistent with a 1-39% stenosis. Left Carotid: There is no evidence of stenosis in the left ICA. The extracranial               vessels were near-normal with only minimal wall thickening or               plaque.  *See table(s) above for measurements and observations.  Electronically signed by Leotis Pain MD on 08/21/2022 at 4:11:29 PM.    Final     Assessment and Plan:   Nathan Fields is a 72 y.o. y/o male has been referred for dysphagia and colonoscopy for colon cancer screening due to prior history of colon polyps.  History of dysphagia since he had bilateral carotid  surgery since then he has lost sensation in his tongue and his history suggestive of transfer dysphagia.  Plan 1.  EGD and colonoscopy to evaluate for dysphagia and surveillance due to prior history of colon polyps we will also rule out eosinophilic esophagitis 2.  Modified barium swallow 3.  Will monitor weight and if weight loss is noted at his next visit and EGD colonoscopy negative may require imaging of his chest abdomen and pelvis   I have discussed alternative options, risks & benefits,  which include, but are not limited to, bleeding, infection, perforation,respiratory complication & drug reaction.  The patient agrees with this plan & written consent will be obtained.     Follow up in 8-12 weeks   Dr Nathan Bellows MD,MRCP(U.K)

## 2022-09-06 ENCOUNTER — Ambulatory Visit
Admission: RE | Admit: 2022-09-06 | Discharge: 2022-09-06 | Disposition: A | Discharge status: Home / Self Care | Payer: Medicare Other | Source: Ambulatory Visit | Attending: Gastroenterology | Admitting: Gastroenterology

## 2022-09-06 DIAGNOSIS — R1319 Other dysphagia: Secondary | ICD-10-CM | POA: Diagnosis not present

## 2022-09-06 DIAGNOSIS — R6339 Other feeding difficulties: Secondary | ICD-10-CM | POA: Diagnosis not present

## 2022-09-06 DIAGNOSIS — R1310 Dysphagia, unspecified: Secondary | ICD-10-CM | POA: Diagnosis not present

## 2022-09-06 NOTE — Progress Notes (Signed)
Modified Barium Swallow Study  Patient Details  Name: Nathan Fields MRN: [id number] Date of Birth: 01-11-1951  Today's Date: 09/06/2022  Modified Barium Swallow completed.  Full report located under Chart Review in the Imaging Section.  History of Present Illness Pt is a 72 y.o. male with c/o dysphagia to solids (particularly dry, crumbly, particulate solids) and liquids, dysguesia, and dysarthria following carotid endartectomy. Pt with hx of GERD. Noted pt with EGD pending.   Clinical Impression Pt presents with a functional oropharyngeal swallow with typical age-related swallowing changes noted (e.g. swallow initiation at the level of the pyrifrom sinuses and transient laryngeal penetration with thin liquids). Screening of the cervical espophagus was unremarkable. Of note, pt coughing throughout evaluation without clear relationship to pharyngoesophageal swallow function. Recommend continuation of current diet with safe swallowing strategies/aspiration precautions and reflux precautions as outlined below. No f/u SLP services warranted at this time. Recommend further GI work up given pt's complaints and results of MBSS. Noted EGD pending.  Factors that may increase risk of adverse event in presence of aspiration ([place] 2021): Respiratory or GI disease  Swallow Evaluation Recommendations Recommendations: PO diet PO Diet Recommendation: Regular;Thin liquids (Level 0) (well moisted solids) Liquid Administration via:  (as tolerated) Supervision: Patient able to self-feed Swallowing strategies  : Minimize environmental distractions;Slow rate;Small bites/sips;Follow solids with liquids Postural changes: Position pt fully upright for meals;Stay upright 30-60 min after meals;Out of bed for meals (Reflux precautions) Oral care recommendations: Oral care BID (2x/day) Recommended consults: Consider GI consultation (GI f/u)     Cherrie Gauze, M.S., [facility] [phone] (ASCOM)  Clearnce Sorrel Miliana Gangwer 09/06/2022,3:15 PM

## 2022-09-16 ENCOUNTER — Encounter: Payer: Self-pay | Admitting: Gastroenterology

## 2022-09-17 ENCOUNTER — Encounter: Payer: Self-pay | Admitting: Gastroenterology

## 2022-09-17 ENCOUNTER — Ambulatory Visit
Admission: RE | Admit: 2022-09-17 | Discharge: 2022-09-17 | Disposition: A | Discharge status: Home / Self Care | Payer: Medicare Other | Source: Ambulatory Visit | Attending: Gastroenterology | Admitting: Gastroenterology

## 2022-09-17 ENCOUNTER — Ambulatory Visit: Payer: Medicare Other | Admitting: Certified Registered"

## 2022-09-17 ENCOUNTER — Encounter
Admission: RE | Disposition: A | Discharge status: Home / Self Care | Payer: Self-pay | Source: Ambulatory Visit | Attending: Gastroenterology

## 2022-09-17 DIAGNOSIS — I10 Essential (primary) hypertension: Secondary | ICD-10-CM | POA: Diagnosis not present

## 2022-09-17 DIAGNOSIS — M1990 Unspecified osteoarthritis, unspecified site: Secondary | ICD-10-CM | POA: Insufficient documentation

## 2022-09-17 DIAGNOSIS — K635 Polyp of colon: Secondary | ICD-10-CM | POA: Diagnosis not present

## 2022-09-17 DIAGNOSIS — D126 Benign neoplasm of colon, unspecified: Secondary | ICD-10-CM

## 2022-09-17 DIAGNOSIS — E119 Type 2 diabetes mellitus without complications: Secondary | ICD-10-CM | POA: Diagnosis not present

## 2022-09-17 DIAGNOSIS — Z1211 Encounter for screening for malignant neoplasm of colon: Secondary | ICD-10-CM | POA: Diagnosis not present

## 2022-09-17 DIAGNOSIS — Z8673 Personal history of transient ischemic attack (TIA), and cerebral infarction without residual deficits: Secondary | ICD-10-CM | POA: Diagnosis not present

## 2022-09-17 DIAGNOSIS — K5730 Diverticulosis of large intestine without perforation or abscess without bleeding: Secondary | ICD-10-CM | POA: Diagnosis not present

## 2022-09-17 DIAGNOSIS — R1310 Dysphagia, unspecified: Secondary | ICD-10-CM | POA: Diagnosis not present

## 2022-09-17 DIAGNOSIS — D124 Benign neoplasm of descending colon: Secondary | ICD-10-CM | POA: Insufficient documentation

## 2022-09-17 DIAGNOSIS — K2970 Gastritis, unspecified, without bleeding: Secondary | ICD-10-CM | POA: Diagnosis not present

## 2022-09-17 DIAGNOSIS — R1319 Other dysphagia: Secondary | ICD-10-CM | POA: Diagnosis not present

## 2022-09-17 DIAGNOSIS — Z86010 Personal history of colonic polyps: Secondary | ICD-10-CM

## 2022-09-17 DIAGNOSIS — K319 Disease of stomach and duodenum, unspecified: Secondary | ICD-10-CM | POA: Diagnosis not present

## 2022-09-17 DIAGNOSIS — I2510 Atherosclerotic heart disease of native coronary artery without angina pectoris: Secondary | ICD-10-CM | POA: Insufficient documentation

## 2022-09-17 HISTORY — PX: COLONOSCOPY WITH PROPOFOL: SHX5780

## 2022-09-17 HISTORY — PX: ESOPHAGOGASTRODUODENOSCOPY (EGD) WITH PROPOFOL: SHX5813

## 2022-09-17 LAB — GLUCOSE, CAPILLARY: Glucose-Capillary: 122 mg/dL — ABNORMAL HIGH (ref 70–99)

## 2022-09-17 SURGERY — COLONOSCOPY WITH PROPOFOL
Anesthesia: General

## 2022-09-17 MED ORDER — STERILE WATER FOR IRRIGATION IR SOLN
Status: DC | PRN
  Administered 2022-09-17: 60 mL

## 2022-09-17 MED ORDER — SODIUM CHLORIDE 0.9 % IV SOLN: INTRAVENOUS | Status: DC

## 2022-09-17 MED ORDER — PROPOFOL 500 MG/50ML IV EMUL
INTRAVENOUS | Status: DC | PRN
  Administered 2022-09-17: 145 ug/kg/min via INTRAVENOUS

## 2022-09-17 MED ORDER — PROPOFOL 10 MG/ML IV BOLUS
INTRAVENOUS | Status: DC | PRN
  Administered 2022-09-17: 70 mg via INTRAVENOUS
  Administered 2022-09-17: 30 mg via INTRAVENOUS

## 2022-09-17 MED ORDER — GLYCOPYRROLATE 0.2 MG/ML IJ SOLN
INTRAMUSCULAR | Status: DC | PRN
  Administered 2022-09-17: .2 mg via INTRAVENOUS

## 2022-09-17 MED ORDER — LIDOCAINE HCL (CARDIAC) PF 100 MG/5ML IV SOSY
PREFILLED_SYRINGE | INTRAVENOUS | Status: DC | PRN
  Administered 2022-09-17: 100 mg via INTRAVENOUS

## 2022-09-17 NOTE — Op Note (Signed)
[facility] Gastroenterology Patient Name: Nathan Fields Procedure Date: 09/17/2022 9:54 AM MRN: [id number] Account #: [id number] Date of Birth: 1951-06-05 Admit Type: Outpatient Age: 72 Room: 2 Gender: Male Note Status: Finalized Instrument Name: Michaelle Birks [id number] Procedure:             Upper GI endoscopy Indications:           Dysphagia Providers:             [name] MD, MD Medicines:             Monitored Anesthesia Care Complications:         No immediate complications. Procedure:             Pre-Anesthesia Assessment:                        - Prior to the procedure, a History and Physical was                         performed, and patient medications, allergies and                         sensitivities were reviewed. The patient's tolerance                         of previous anesthesia was reviewed.                        - The risks and benefits of the procedure and the                         sedation options and risks were discussed with the                         patient. All questions were answered and informed                         consent was obtained.                        - ASA Grade Assessment: II - A patient with mild                         systemic disease.                        After obtaining informed consent, the endoscope was                         passed under direct vision. Throughout the procedure,                         the patient's blood pressure, pulse, and oxygen                         saturations were monitored continuously. The Endoscope                         was introduced through the mouth, and advanced to the  third part of duodenum. The upper GI endoscopy was                         accomplished with ease. The patient tolerated the                         procedure well. Findings:      The examined duodenum was normal.      The examined esophagus was normal. Biopsies were taken with  a cold       forceps for histology.      Diffuse moderate inflammation characterized by congestion (edema),       erosions and erythema was found on the greater curvature of the stomach.       Biopsies were taken with a cold forceps for histology.      The cardia and gastric fundus were normal on retroflexion. Impression:            - Normal examined duodenum.                        - Normal esophagus. Biopsied.                        - Gastritis. Biopsied. Recommendation:        - Await pathology results.                        - Perform a colonoscopy today. Procedure Code(s):     --- Professional ---                        [phone], Esophagogastroduodenoscopy, flexible,                         transoral; with biopsy, single or multiple Diagnosis Code(s):     --- Professional ---                        K29.70, Gastritis, unspecified, without bleeding                        R13.10, Dysphagia, unspecified CPT copyright 2022 American Medical Association. All rights reserved. The codes documented in this report are preliminary and upon coder review may  be revised to meet current compliance requirements. [name], MD [name] MD, MD 09/17/2022 10:25:27 AM This report has been signed electronically. Number of Addenda: 0 Note Initiated On: 09/17/2022 9:54 AM Estimated Blood Loss:  Estimated blood loss: none.      [facility]

## 2022-09-17 NOTE — H&P (Signed)
Nathan Bellows, MD [address] [place], [place], [place], [address] [id number], [place], [place], [place], [address] Phone: [phone]  Fax: [phone]  Primary Care Physician:  Valerie Roys, DO   Pre-Procedure History & Physical: HPI:  Nathan Fields is a 72 y.o. male is here for an endoscopy and colonoscopy    Past Medical History:  Diagnosis Date   Anxiety    Aortic atherosclerosis ([place])    Arthritis of right foot    B12 deficiency    Bilateral carotid artery stenosis 03/21/2021   a.) Carotid doppler 03/21/2021: 0000000 pRICA, [id number] pLICA   CAD (coronary artery disease) 09/09/2007   a.) LHC 09/09/2007: EF 60%; 30% dLM, 25% pLAD, 25% mLAD, 40% oLCx, 50% pLCx, 30% pRI, 100% pRCA with good collateral flow; no interventions opting for med mgmt.   Chronic low back pain    Depression    Diastolic dysfunction 0000000   a.)  TTE 08/16/2021: EF >55%; mild AR/MR/TR, trivial PR; G1DD.   GERD (gastroesophageal reflux disease)    History of kidney stones    Hyperlipidemia    Hypertension    Murmur    Plantar fasciitis    Pneumonia    Right thyroid nodule 06/15/2021   a.) CTA neck 06/15/2021: measured 74mm.   Syncope    T2DM (type 2 diabetes mellitus) ([place])    TIA (transient ischemic attack)     Past Surgical History:  Procedure Laterality Date   CARDIAC CATHETERIZATION     COLONOSCOPY  2011   Polyp removed, follow up in 5 years   COLONOSCOPY WITH PROPOFOL N/A 01/14/2017   Procedure: COLONOSCOPY WITH PROPOFOL;  Surgeon: Nathan Bellows, MD;  Location: [facility] ENDOSCOPY;  Service: Endoscopy;  Laterality: N/A;   ENDARTERECTOMY Left 09/06/2021   Procedure: ENDARTERECTOMY CAROTID;  Surgeon: Algernon Huxley, MD;  Location: [facility] ORS;  Service: Vascular;  Laterality: Left;    Prior to Admission medications   Medication Sig Start Date End Date Taking? Authorizing Provider  amLODipine (NORVASC) 2.5 MG tablet Take 1 tablet (2.5 mg total) by mouth daily. 03/27/22  Yes Johnson,  Megan P, DO  aspirin 81 MG tablet Take 81 mg by mouth daily.   Yes [provider]  valsartan (DIOVAN) 40 MG tablet Take 1 tablet (40 mg total) by mouth daily. 03/27/22  Yes Johnson, Megan P, DO  rosuvastatin (CRESTOR) 5 MG tablet TAKE 1 TABLET BY MOUTH EVERY OTHER DAY 08/26/22   Park Liter P, DO    Allergies as of 08/30/2022 - Review Complete 08/20/2022  Allergen Reaction Noted   Statins Other (See Comments) 02/05/2016   Zetia [ezetimibe] Other (See Comments) 02/05/2016   Niacin Rash 11/11/2016   Niacin and related Rash 11/11/2016    Family History  Problem Relation Age of Onset   Heart disease Mother    Hyperlipidemia Mother    Diabetes Mother    Congestive Heart Failure Mother    Hypertension Mother    Diabetes Father    Heart attack Father    Hyperlipidemia Father     Social History   Socioeconomic History   Marital status: Married    Spouse name: Lelan Pons   Number of children: 2   Years of education: 9th grade   Highest education level: Not on file  Occupational History   Occupation: employed  Tobacco Use   Smoking status: Never   Smokeless tobacco: Never  Vaping Use   Vaping Use: Never used  Substance and Sexual  Activity   Alcohol use: Not Currently    Comment: on rare occasion   Drug use: Yes    Types: Marijuana   Sexual activity: Not Currently  Other Topics Concern   Not on file  Social History Narrative   Live in the house with wife   Social Determinants of Health   Financial Resource Strain: Low Risk  (12/05/2021)   Overall Financial Resource Strain (CARDIA)    Difficulty of Paying Living Expenses: Not hard at all  Food Insecurity: No Food Insecurity (12/05/2021)   Hunger Vital Sign    Worried About Running Out of Food in the Last Year: Never true    Ran Out of Food in the Last Year: Never true  Transportation Needs: No Transportation Needs (12/05/2021)   PRAPARE - Hydrologist (Medical): No    Lack of  Transportation (Non-Medical): No  Physical Activity: Inactive (12/05/2021)   Exercise Vital Sign    Days of Exercise per Week: 0 days    Minutes of Exercise per Session: 0 min  Stress: No Stress Concern Present (12/05/2021)   [place]    Feeling of Stress : Only a little  Social Connections: Moderately Isolated (12/05/2021)   Social Connection and Isolation Panel [NHANES]    Frequency of Communication with Friends and Family: Three times a week    Frequency of Social Gatherings with Friends and Family: Once a week    Attends Religious Services: Never    Marine scientist or Organizations: No    Attends Archivist Meetings: Never    Marital Status: Married  Human resources officer Violence: Not At Risk (12/05/2021)   Humiliation, Afraid, Rape, and Kick questionnaire    Fear of Current or Ex-Partner: No    Emotionally Abused: No    Physically Abused: No    Sexually Abused: No    Review of Systems: See HPI, otherwise negative ROS  Physical Exam: BP (!) 157/75   Pulse 64   Temp (!) 96.8 F (36 C) (Temporal)   Resp 17   Wt 71.4 kg   SpO2 97%   BMI 25.41 kg/m  General:   Alert,  pleasant and cooperative in NAD Head:  Normocephalic and atraumatic. Neck:  Supple; no masses or thyromegaly. Lungs:  Clear throughout to auscultation, normal respiratory effort.    Heart:  +S1, +S2, Regular rate and rhythm, No edema. Abdomen:  Soft, nontender and nondistended. Normal bowel sounds, without guarding, and without rebound.   Neurologic:  Alert and  oriented x4;  grossly normal neurologically.  Impression/Plan: Nathan Fields is here for an endoscopy and colonoscopy  to be performed for  evaluation of dysphagia and colon cancer screening     Risks, benefits, limitations, and alternatives regarding endoscopy have been reviewed with the patient.  Questions have been answered.  All parties agreeable.   Nathan Bellows, MD   09/17/2022, 9:57 AM

## 2022-09-17 NOTE — Anesthesia Preprocedure Evaluation (Addendum)
Anesthesia Evaluation  Patient identified by MRN, date of birth, ID band Patient awake    Reviewed: Allergy & Precautions, NPO status , Patient's Chart, lab work & pertinent test results  Airway Mallampati: II  TM Distance: >3 FB Neck ROM: Full    Dental  (+) Teeth Intact   Pulmonary neg pulmonary ROS, Patient abstained from smoking.   Pulmonary exam normal breath sounds clear to auscultation       Cardiovascular Exercise Tolerance: Good hypertension, Pt. on medications + CAD  Normal cardiovascular exam Rhythm:Regular Rate:Normal  TTE 08/16/2021: EF >55%; mild AR/MR/TR, trivial PR; G1DD.   Neuro/Psych   Anxiety Depression    TIA (s/p left endarterectomy) negative psych ROS   GI/Hepatic Neg liver ROS,,,dysphagia   Endo/Other  diabetes, Type 2    Renal/GU      Musculoskeletal  (+) Arthritis ,    Abdominal Normal abdominal exam  (+)   Peds negative pediatric ROS (+)  Hematology negative hematology ROS (+)   Anesthesia Other Findings Past Medical History: No date: Anxiety No date: Aortic atherosclerosis ([facility]) No date: Arthritis of right foot No date: B12 deficiency 03/21/2021: Bilateral carotid artery stenosis     Comment:  a.) Carotid doppler 03/21/2021: 50-60% pRICA, [id number]               pLICA [id number]: CAD (coronary artery disease)     Comment:  a.) LHC 09/09/2007: EF 60%; 30% dLM, 25% pLAD, 25% mLAD,              40% oLCx, 50% pLCx, 30% pRI, 100% pRCA with good               collateral flow; no interventions opting for med mgmt. No date: Chronic low back pain No date: Depression 0000000: Diastolic dysfunction     Comment:  a.)  TTE 08/16/2021: EF >55%; mild AR/MR/TR, trivial PR;              G1DD. No date: GERD (gastroesophageal reflux disease) No date: History of kidney stones No date: Hyperlipidemia No date: Hypertension No date: Murmur No date: Plantar fasciitis No date:  Pneumonia 06/15/2021: Right thyroid nodule     Comment:  a.) CTA neck 06/15/2021: measured 8mm. No date: Syncope No date: T2DM (type 2 diabetes mellitus) ([place]) No date: TIA (transient ischemic attack)  Past Surgical History: No date: CARDIAC CATHETERIZATION 2011: COLONOSCOPY     Comment:  Polyp removed, follow up in 5 years 01/14/2017: COLONOSCOPY WITH PROPOFOL; N/A     Comment:  Procedure: COLONOSCOPY WITH PROPOFOL;  Surgeon: [name], MD;  Location: [facility] ENDOSCOPY;  Service:               Endoscopy;  Laterality: N/A;  BMI    Body Mass Index: 26.63 kg/m      Reproductive/Obstetrics negative OB ROS                             Anesthesia Physical Anesthesia Plan  ASA: 3  Anesthesia Plan: General   Post-op Pain Management:    Induction: Intravenous  PONV Risk Score and Plan: Treatment may vary due to age or medical condition  Airway Management Planned: Natural Airway  Additional Equipment:   Intra-op Plan:   Post-operative Plan:   Informed Consent: I have reviewed the patients History and Physical, chart, labs and discussed the procedure including  the risks, benefits and alternatives for the proposed anesthesia with the patient or authorized representative who has indicated his/her understanding and acceptance.     Dental Advisory Given  Plan Discussed with: CRNA and Surgeon  Anesthesia Plan Comments:         Anesthesia Quick Evaluation

## 2022-09-17 NOTE — Transfer of Care (Signed)
Immediate Anesthesia Transfer of Care Note  Patient: Nathan Fields  Procedure(s) Performed: COLONOSCOPY WITH PROPOFOL ESOPHAGOGASTRODUODENOSCOPY (EGD) WITH PROPOFOL  Patient Location: Endoscopy Unit  Anesthesia Type:General  Level of Consciousness: drowsy and patient cooperative  Airway & Oxygen Therapy: Patient Spontanous Breathing and Patient connected to face mask oxygen  Post-op Assessment: Report given to RN and Post -op Vital signs reviewed and stable  Post vital signs: Reviewed and stable  Last Vitals:  Vitals Value Taken Time  BP 99/62 09/17/22 1043  Temp 35.9 C 09/17/22 1043  Pulse 73 09/17/22 1050  Resp 14 09/17/22 1050  SpO2 97 % 09/17/22 1050  Vitals shown include unvalidated device data.  Last Pain:  Vitals:   09/17/22 1043  TempSrc: Temporal  PainSc: Asleep         Complications: No notable events documented.

## 2022-09-17 NOTE — Op Note (Signed)
[facility] Gastroenterology Patient Name: Nathan Fields Procedure Date: 09/17/2022 9:54 AM MRN: [id number] Account #: [id number] Date of Birth: 1950-07-31 Admit Type: Outpatient Age: 72 Room: [facility] ENDO ROOM 2 Gender: Male Note Status: Finalized Instrument Name: Park Meo [id number] Procedure:             Colonoscopy Indications:           Screening for colorectal malignant neoplasm Providers:             [name] MD, MD Medicines:             Monitored Anesthesia Care Complications:         No immediate complications. Procedure:             Pre-Anesthesia Assessment:                        - Prior to the procedure, a History and Physical was                         performed, and patient medications, allergies and                         sensitivities were reviewed. The patient's tolerance                         of previous anesthesia was reviewed.                        - The risks and benefits of the procedure and the                         sedation options and risks were discussed with the                         patient. All questions were answered and informed                         consent was obtained.                        - ASA Grade Assessment: II - A patient with mild                         systemic disease.                        After obtaining informed consent, the colonoscope was                         passed under direct vision. Throughout the procedure,                         the patient's blood pressure, pulse, and oxygen                         saturations were monitored continuously. The                         Colonoscope was introduced through the anus and  advanced to the the cecum, identified by the                         appendiceal orifice. The colonoscopy was performed                         with ease. The patient tolerated the procedure well.                         The quality of the bowel preparation  was excellent.                         The ileocecal valve, appendiceal orifice, and rectum                         were photographed. Findings:      The perianal and digital rectal examinations were normal.      Multiple medium-mouthed diverticula were found in the sigmoid colon.      Two sessile polyps were found in the sigmoid colon and descending colon.       The polyps were 4 to 5 mm in size. These polyps were removed with a cold       snare. Resection and retrieval were complete.      The exam was otherwise without abnormality on direct and retroflexion       views. Impression:            - Diverticulosis in the sigmoid colon.                        - Two 4 to 5 mm polyps in the sigmoid colon and in the                         descending colon, removed with a cold snare. Resected                         and retrieved.                        - The examination was otherwise normal on direct and                         retroflexion views. Recommendation:        - Discharge patient to home (with escort).                        - Resume previous diet.                        - Continue present medications.                        - Await pathology results.                        - Repeat colonoscopy for surveillance based on                         pathology results. Procedure Code(s):     --- Professional ---  45385, Colonoscopy, flexible; with removal of                         tumor(s), polyp(s), or other lesion(s) by snare                         technique Diagnosis Code(s):     --- Professional ---                        Z12.11, Encounter for screening for malignant neoplasm                         of colon                        D12.5, Benign neoplasm of sigmoid colon                        D12.4, Benign neoplasm of descending colon                        K57.30, Diverticulosis of large intestine without                         perforation or abscess  without bleeding CPT copyright 2022 American Medical Association. All rights reserved. The codes documented in this report are preliminary and upon coder review may  be revised to meet current compliance requirements. [name], MD [name] MD, MD 09/17/2022 10:40:39 AM This report has been signed electronically. Number of Addenda: 0 Note Initiated On: 09/17/2022 9:54 AM Scope Withdrawal Time: 0 hours 9 minutes 56 seconds  Total Procedure Duration: 0 hours 11 minutes 58 seconds  Estimated Blood Loss:  Estimated blood loss: none.      [facility]

## 2022-09-18 LAB — SURGICAL PATHOLOGY

## 2022-09-18 NOTE — Anesthesia Postprocedure Evaluation (Signed)
Anesthesia Post Note  Patient: Nathan Fields  Procedure(s) Performed: COLONOSCOPY WITH PROPOFOL ESOPHAGOGASTRODUODENOSCOPY (EGD) WITH PROPOFOL  Patient location during evaluation: PACU Anesthesia Type: General Level of consciousness: awake and alert Pain management: pain level controlled Vital Signs Assessment: post-procedure vital signs reviewed and stable Respiratory status: spontaneous breathing, nonlabored ventilation and respiratory function stable Cardiovascular status: blood pressure returned to baseline and stable Postop Assessment: no apparent nausea or vomiting Anesthetic complications: no   No notable events documented.   Last Vitals:  Vitals:   09/17/22 1103 09/17/22 1113  BP: 127/88   Pulse: 74 67  Resp: 17   Temp:    SpO2: 99% 99%    Last Pain:  Vitals:   09/17/22 1113  TempSrc:   PainSc: 0-No pain                 [place] Ouch

## 2022-10-04 ENCOUNTER — Encounter: Payer: Self-pay | Admitting: Gastroenterology

## 2022-10-05 ENCOUNTER — Other Ambulatory Visit: Payer: Self-pay | Admitting: Family Medicine

## 2022-10-07 NOTE — Telephone Encounter (Signed)
Pt needs an appt. Pt does qualify for courtesy refill.

## 2022-10-07 NOTE — Telephone Encounter (Signed)
Patient needs OV for additional refills. Will refill for 30 days until OV can be made.  Requested Prescriptions  Pending Prescriptions Disp Refills   amLODipine (NORVASC) 2.5 MG tablet [Pharmacy Med Name: AMLODIPINE BESYLATE 2.5 MG TAB] 30 tablet 0    Sig: TAKE 1 TABLET BY MOUTH EVERY DAY     Cardiovascular: Calcium Channel Blockers 2 Failed - 10/05/2022  1:13 AM      Failed - Valid encounter within last 6 months    Recent Outpatient Visits           6 months ago Mixed hyperlipidemia   [facility] [place], Megan P, DO   1 year ago TIA (transient ischemic attack)   [facility] [facility] Dorcas Carrow, DO   1 year ago Neck pain   [place] [facility] [place], [place], DO   1 year ago Neck pain   [place] [place], Megan [place], DO   1 year ago Coronary artery disease involving native coronary artery of native heart without angina pectoris   [place] [facility] Dorcas Carrow, DO       Future Appointments             In 1 month Wyline Mood, MD [facility] at [facility] - Last BP in normal range    BP Readings from Last 1 Encounters:  09/17/22 127/88         Passed - Last Heart Rate in normal range    Pulse Readings from Last 1 Encounters:  09/17/22 67

## 2022-11-02 ENCOUNTER — Other Ambulatory Visit: Payer: Self-pay | Admitting: Family Medicine

## 2022-11-04 ENCOUNTER — Other Ambulatory Visit: Payer: Self-pay | Admitting: Family Medicine

## 2022-11-04 NOTE — Telephone Encounter (Signed)
Requested Prescriptions  Pending Prescriptions Disp Refills   amLODipine (NORVASC) 2.5 MG tablet [Pharmacy Med Name: AMLODIPINE BESYLATE 2.5 MG TAB] 90 tablet 0    Sig: TAKE 1 TABLET BY MOUTH EVERY DAY     Cardiovascular: Calcium Channel Blockers 2 Failed - 11/02/2022  1:13 AM      Failed - Valid encounter within last 6 months    Recent Outpatient Visits           7 months ago Mixed hyperlipidemia   [facility] [place], Megan P, DO   1 year ago TIA (transient ischemic attack)   [place] [facility] Dorcas Carrow, DO   1 year ago Neck pain   [place] [place], [place], DO   1 year ago Neck pain   [place] [facility] [place], Megan [place], DO   1 year ago Coronary artery disease involving native coronary artery of native heart without angina pectoris    [facility] Dorcas Carrow, DO       Future Appointments             In 3 weeks Wyline Mood, MD [facility] [place] [facility] at [facility] - Last BP in normal range    BP Readings from Last 1 Encounters:  09/17/22 127/88         Passed - Last Heart Rate in normal range    Pulse Readings from Last 1 Encounters:  09/17/22 67

## 2022-11-05 NOTE — Telephone Encounter (Signed)
Requested medication (s) are due for refill today: yes  Requested medication (s) are on the active medication list: yes  Last refill:  03/27/22 #90 1 RF  Future visit scheduled: yes  Notes to clinic:  overdue lab work    Requested Prescriptions  Pending Prescriptions Disp Refills   valsartan (DIOVAN) 40 MG tablet [Pharmacy Med Name: VALSARTAN 40 MG TABLET] 90 tablet 1    Sig: TAKE 1 TABLET BY MOUTH EVERY DAY     Cardiovascular:  Angiotensin Receptor Blockers Failed - 11/04/2022  2:04 AM      Failed - Cr in normal range and within 180 days    Creatinine  Date Value Ref Range Status  07/19/2012 1.12 0.60 - 1.30 mg/dL Final   Creatinine, Ser  Date Value Ref Range Status  03/21/2022 1.05 0.76 - 1.27 mg/dL Final         Failed - K in normal range and within 180 days    Potassium  Date Value Ref Range Status  03/21/2022 4.2 3.5 - 5.2 mmol/L Final  07/19/2012 3.6 3.5 - 5.1 mmol/L Final         Failed - Valid encounter within last 6 months    Recent Outpatient Visits           7 months ago Mixed hyperlipidemia   [place] [facility] [place], Megan P, DO   1 year ago TIA (transient ischemic attack)   [place] Dorcas Carrow, DO   1 year ago Neck pain   [place] [facility] [place], [place], DO   1 year ago Neck pain   [facility] [place], [place], DO   1 year ago Coronary artery disease involving native coronary artery of native heart without angina pectoris    [facility] Dorcas Carrow, DO       Future Appointments             In 3 weeks Wyline Mood, MD [facility] [place] [facility] at [facility] - Patient is not pregnant      Passed - Last BP in normal range    BP Readings from Last 1 Encounters:  09/17/22 127/88

## 2022-11-20 ENCOUNTER — Telehealth: Payer: Self-pay

## 2022-11-20 NOTE — Telephone Encounter (Signed)
Called patient and let him know that his appointment had been changed to Virtual. He had no further questions.

## 2022-11-20 NOTE — Telephone Encounter (Signed)
Patient wife called and states she wants to know if the patients appointment can be change to a mychart visit instead of a in person visit. Patient wife is not on patient DPR form because patient does not have a DPR form. Please call patient back to let him know

## 2022-11-28 ENCOUNTER — Ambulatory Visit: Payer: Medicare Other | Admitting: Gastroenterology

## 2022-11-28 ENCOUNTER — Telehealth (INDEPENDENT_AMBULATORY_CARE_PROVIDER_SITE_OTHER): Payer: Medicare Other | Admitting: Gastroenterology

## 2022-11-28 DIAGNOSIS — R1319 Other dysphagia: Secondary | ICD-10-CM

## 2022-11-28 NOTE — Progress Notes (Signed)
Wyline Mood , MD [address]  [address]  [place], [place] [address]  Main: [phone]  Fax: [phone]   Primary Care Physician: Dorcas Carrow, DO  Virtual Visit via Video Note  I connected with patient on 11/28/22 at  2:30 PM EDT by video and verified that I am speaking with the correct person using two identifiers.   I discussed the limitations, risks, security and privacy concerns of performing an evaluation and management service by video  and the availability of in person appointments. I also discussed with the patient that there may be a patient responsible charge related to this service. The patient expressed understanding and agreed to proceed.  Location of Patient: Home Location of Provider: Home Persons involved: Patient and provider only   History of Present Illness: Chief Complaint  Patient presents with   Dysphagia    HPI: Nathan Fields is a 72 y.o. male  Initially referred and seen on 08/29/2022 for dysphagia , ongoing since he had bilateral carotid surgery and since then he has been having difficulty swallowing. He states that the food does not go down affect solids and liquids he is also lost some weight .  He denies any coughing but he has lost sensation over his tongue since the surgery and he cannot taste certain foods. 03/21/2022 hemoglobin 14.5    Interval history  08/29/2022-11/28/2022  09/17/2022: EGD: no E/o EOE on bx, reactive gastropathy on gastric bx .  Diverticulosis and sessile serrated polyp resected on colonoscopy.   09/06/2022: Modified barium swallow showed : no gross abnormalities given recs by swallowing specialist to follow   Swallow Evaluation Recommendations Recommendations: PO diet PO Diet Recommendation: Regular;Thin liquids (Level 0) (well moisted solids) Liquid Administration via:  (as tolerated) Supervision: Patient able to self-feed Swallowing strategies  : Minimize environmental distractions;Slow rate;Small  bites/sips;Follow solids with liquids Postural changes: Position pt fully upright for meals;Stay upright 30-60 min after meals;Out of bed for meals (Reflux precautions) Oral care recommendations: Oral care BID (2x/day)   He states that he is doing reasonably well.  Sometimes when he lays flat feels like he is choking otherwise no complaints.  He thinks he may be losing some weight. Current Outpatient Medications  Medication Sig Dispense Refill   amLODipine (NORVASC) 2.5 MG tablet TAKE 1 TABLET BY MOUTH EVERY DAY 90 tablet 0   aspirin 81 MG tablet Take 81 mg by mouth daily.     rosuvastatin (CRESTOR) 5 MG tablet TAKE 1 TABLET BY MOUTH EVERY OTHER DAY 45 tablet 1   valsartan (DIOVAN) 40 MG tablet TAKE 1 TABLET BY MOUTH EVERY DAY 30 tablet 1   No current facility-administered medications for this visit.    Allergies as of 11/28/2022 - Review Complete 11/28/2022  Allergen Reaction Noted   Statins Other (See Comments) 02/05/2016   Zetia [ezetimibe] Other (See Comments) 02/05/2016   Niacin Rash 11/11/2016   Niacin and related Rash 11/11/2016    Review of Systems:    All systems reviewed and negative except where noted in HPI.  General Appearance:    Alert, cooperative, no distress, appears stated age  Head:    Normocephalic, without obvious abnormality, atraumatic  Eyes:    PERRL, conjunctiva/corneas clear,  Ears:    Grossly normal hearing    Neurologic:  Grossly normal    Observations/Objective:  Labs: CMP     Component Value Date/Time   NA 138 03/21/2022 1643   NA 136 07/19/2012 0615   K  4.2 03/21/2022 1643   K 3.6 07/19/2012 0615   CL 101 03/21/2022 1643   CL 103 07/19/2012 0615   CO2 23 03/21/2022 1643   CO2 26 07/19/2012 0615   GLUCOSE 89 03/21/2022 1643   GLUCOSE 156 (H) 09/07/2021 0405   GLUCOSE 115 (H) 07/19/2012 0615   BUN 14 03/21/2022 1643   BUN 17 07/19/2012 0615   CREATININE 1.05 03/21/2022 1643   CREATININE 1.12 07/19/2012 0615   CALCIUM 9.9 03/21/2022  1643   CALCIUM 7.7 (L) 07/19/2012 0615   PROT 6.9 03/21/2022 1643   PROT 5.7 (L) 07/19/2012 0615   ALBUMIN 4.6 03/21/2022 1643   ALBUMIN 3.0 (L) 07/19/2012 0615   AST 18 03/21/2022 1643   AST 32 09/23/2016 1556   AST 46 (H) 07/19/2012 0615   ALT 16 03/21/2022 1643   ALT 19 09/23/2016 1556   ALT 30 07/19/2012 0615   ALKPHOS 97 03/21/2022 1643   ALKPHOS 80 07/19/2012 0615   BILITOT 0.4 03/21/2022 1643   BILITOT 0.3 07/19/2012 0615   GFRNONAA >60 09/07/2021 0405   GFRNONAA >60 07/19/2012 0615   GFRAA 79 02/17/2020 1439   GFRAA >60 07/19/2012 0615   Lab Results  Component Value Date   WBC 6.0 03/21/2022   HGB 14.5 03/21/2022   HCT 42.6 03/21/2022   MCV 87 03/21/2022   PLT 203 03/21/2022    Imaging Studies: No results found.  Assessment and Plan:   Nathan Fields is a here to follow up  for dysphagia.  Endoscopy did not show any abnormalities modified barium swallow showed no abnormalities.  Possibly a globus sensation.  He states he possibly may have lost some weight.  From the GI point of view he has had a full evaluation if he has issues of weight loss concerns he would need to follow-up with Dr. Laural Benes and consider further imaging with chest abdomen and pelvis.   Follow-up with me as needed    I discussed the assessment and treatment plan with the patient. The patient was provided an opportunity to ask questions and all were answered. The patient agreed with the plan and demonstrated an understanding of the instructions.   The patient was advised to call back or seek an in-person evaluation if the symptoms worsen or if the condition fails to improve as anticipated.  I provided 12 minutes of face-to-face time during this encounter.  Dr Wyline Mood MD,MRCP [facility]) Gastroenterology/Hepatology Pager: [phone]   Speech recognition software was used to dictate this note.

## 2022-11-29 ENCOUNTER — Ambulatory Visit: Payer: Medicare Other | Admitting: Family Medicine

## 2022-12-11 ENCOUNTER — Other Ambulatory Visit: Payer: Self-pay | Admitting: Family Medicine

## 2022-12-12 NOTE — Telephone Encounter (Signed)
Unable to refill per protocol, Rx request is too soon. Last refill 11/04/22 for 90 days. Patient needs OV for additional refills.  Requested Prescriptions  Pending Prescriptions Disp Refills   amLODipine (NORVASC) 2.5 MG tablet [Pharmacy Med Name: AMLODIPINE BESYLATE 2.5 MG TAB] 90 tablet 0    Sig: TAKE 1 TABLET BY MOUTH EVERY DAY     Cardiovascular: Calcium Channel Blockers 2 Failed - 12/11/2022  2:27 PM      Failed - Valid encounter within last 6 months    Recent Outpatient Visits           8 months ago Mixed hyperlipidemia   [place] [facility] [place], Megan P, DO   1 year ago TIA (transient ischemic attack)   [place] [facility] [place], Megan P, DO   1 year ago Neck pain   [place] [facility] [place], [place], DO   1 year ago Neck pain   [place] [facility] [place], [place] P, DO   2 years ago Coronary artery disease involving native coronary artery of native heart without angina pectoris   [place] [facility], Megan P, DO              Passed - Last BP in normal range    BP Readings from Last 1 Encounters:  09/17/22 127/88         Passed - Last Heart Rate in normal range    Pulse Readings from Last 1 Encounters:  09/17/22 67

## 2022-12-29 IMAGING — CT CT HEAD W/O CM
4 series · 16 of 47 positions shown, 18 images · non-contrast
Comparison: CT angiogram neck 06/15/2021.

CLINICAL DATA: Provided history: Vision loss, monocular, possible
TIA. Additional history provided: Left-sided head pressure and
vision loss.



[Series 2: head wo · axial · 0.41mm/px · z∈[+966,+1071]mm · 7 of 29 slices shown, 9 images]
[im 4/29  brain]
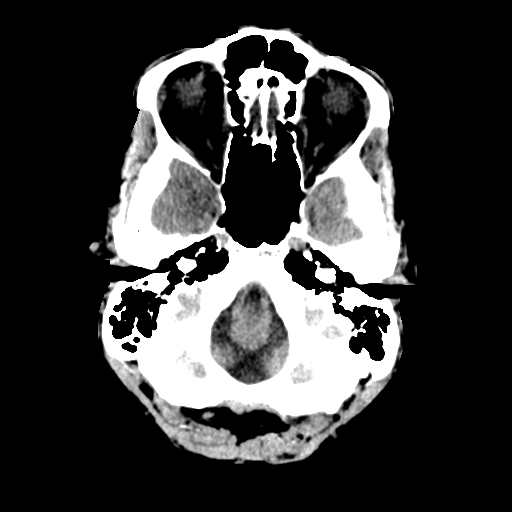
[im 4/29  bone]
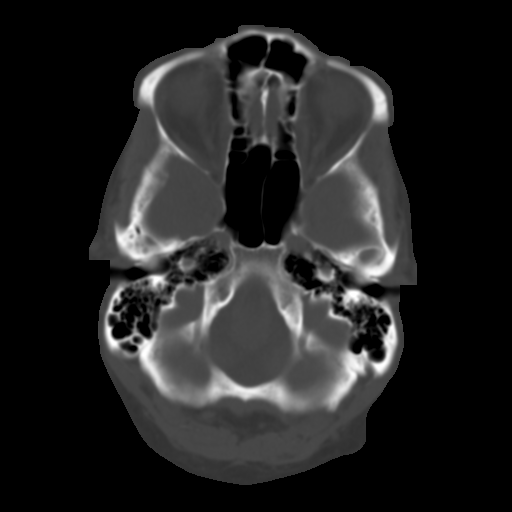
[im 8/29  brain]
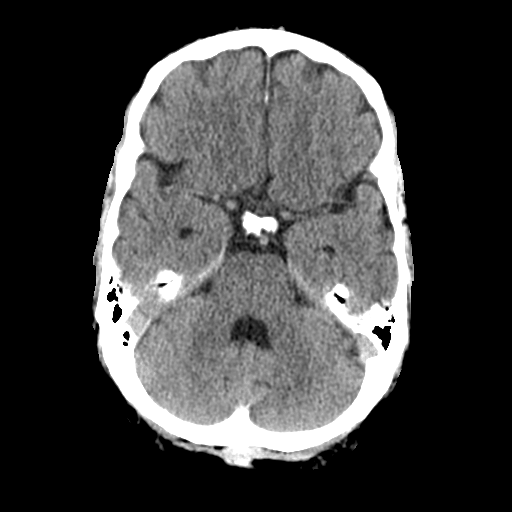
[im 11/29  brain]
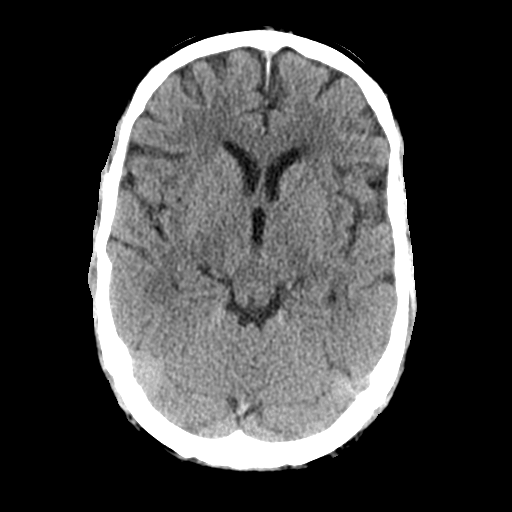
[im 15/29  brain]
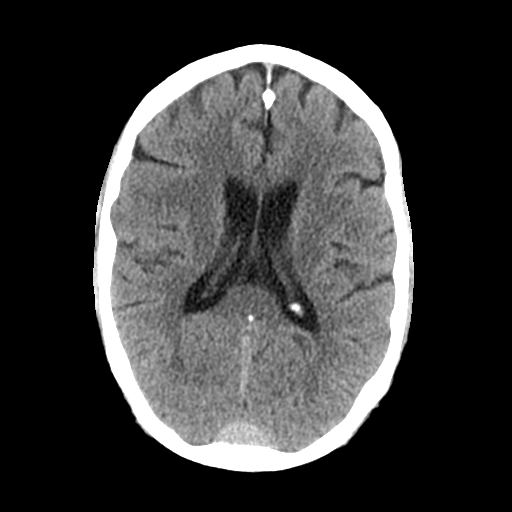
[im 18/29  brain]
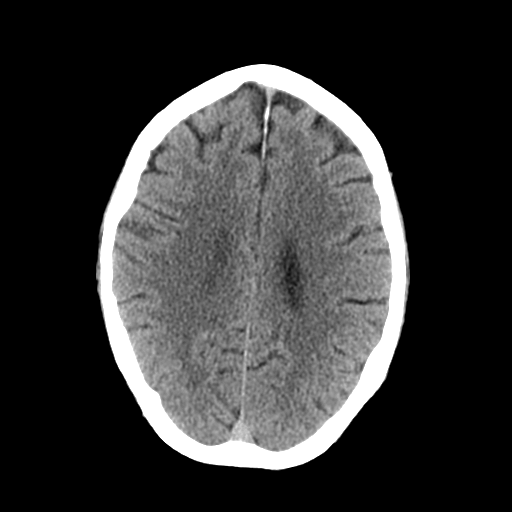
[im 18/29  bone]
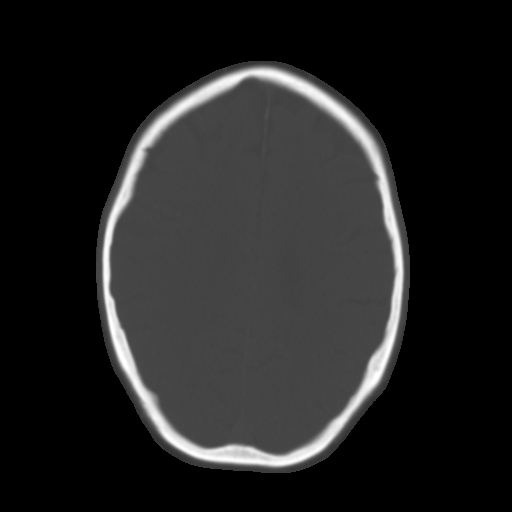
[im 22/29  brain]
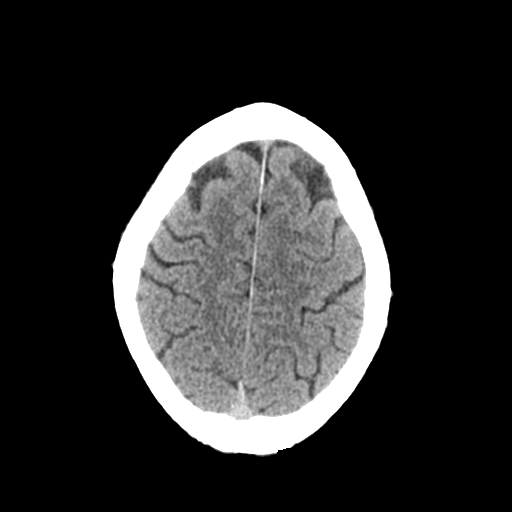
[im 25/29  brain]
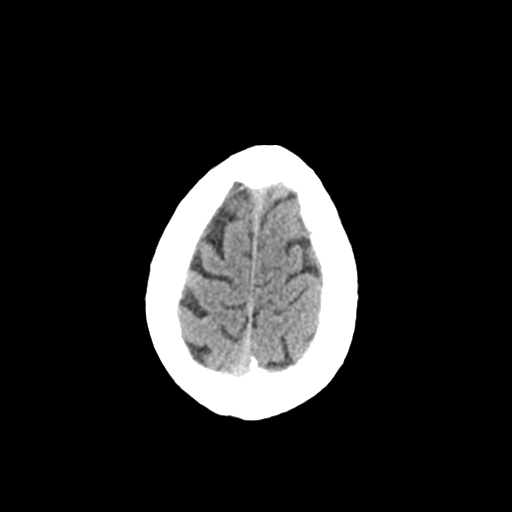

[Series 3: head bone · axial · 0.41mm/px · z∈[+965,+993]mm · 3 of 72 slices shown]
[im 8/72  bone]
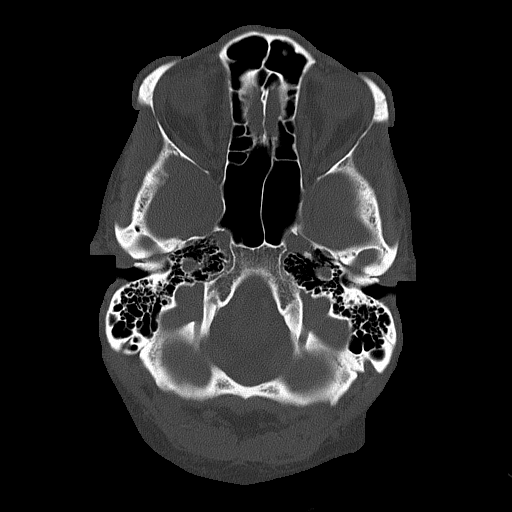
[im 15/72  bone]
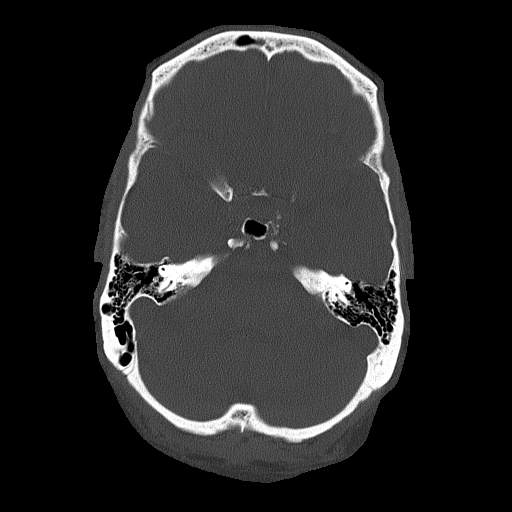
[im 22/72  bone]
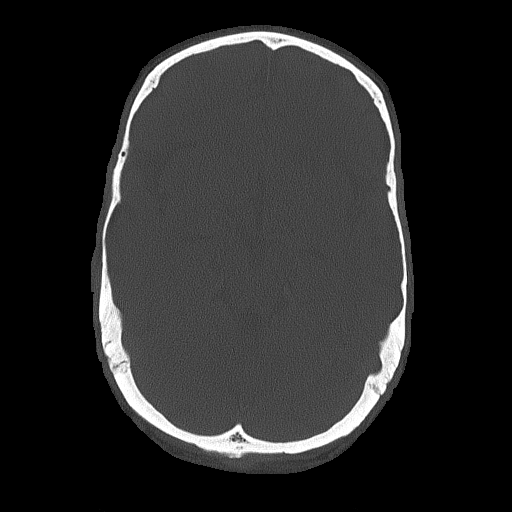

[Series 4: coronal soft tissue · coronal · 0.30mm/px · 3 of 66 slices shown]
[im 22/66  brain]
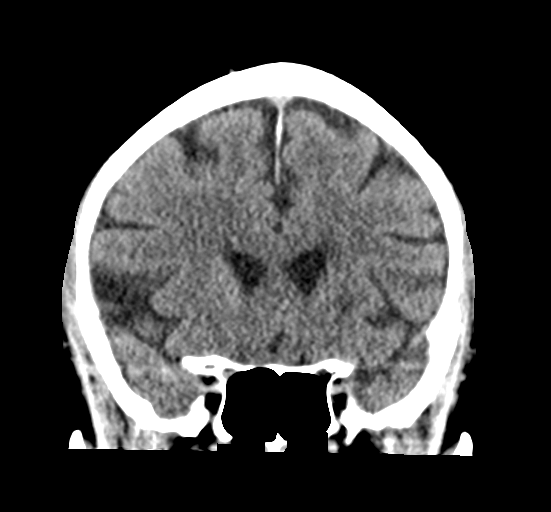
[im 29/66  brain]
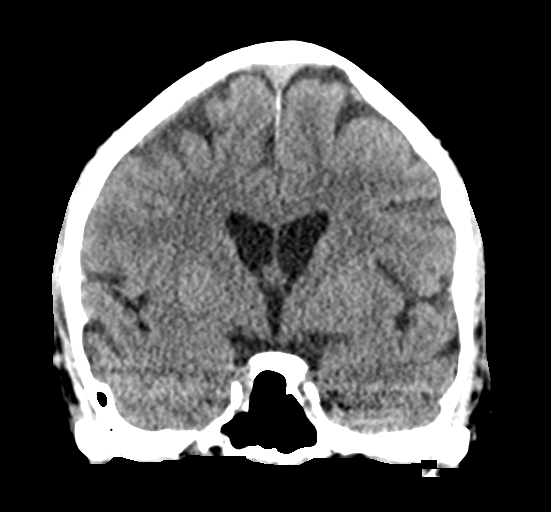
[im 37/66  brain]
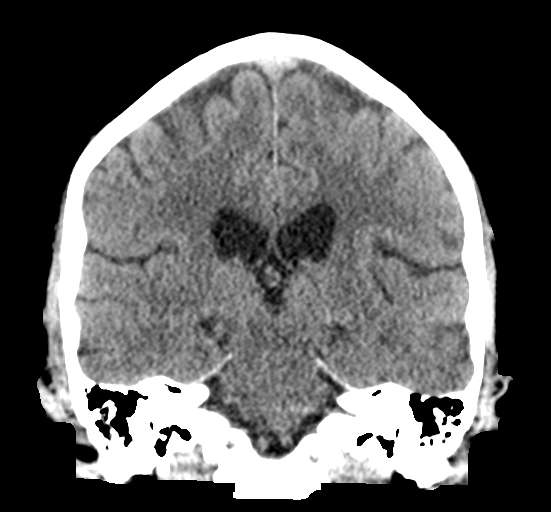

[Series 5: sagittal soft tissue · sagittal · 0.30mm/px · 3 of 56 slices shown]
[im 19/56  brain]
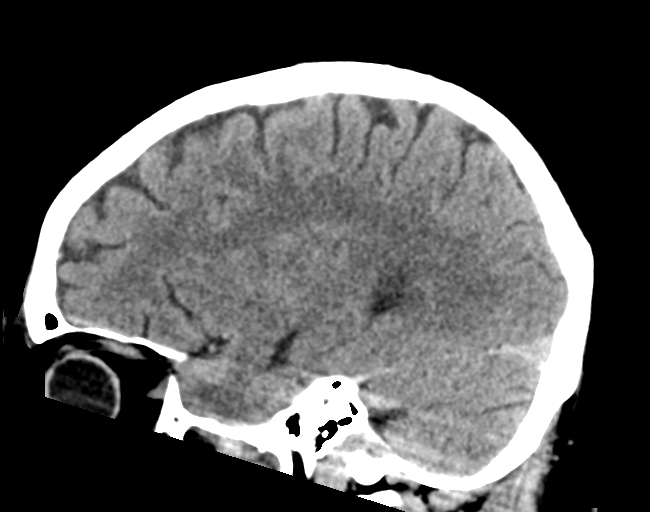
[im 28/56  brain]
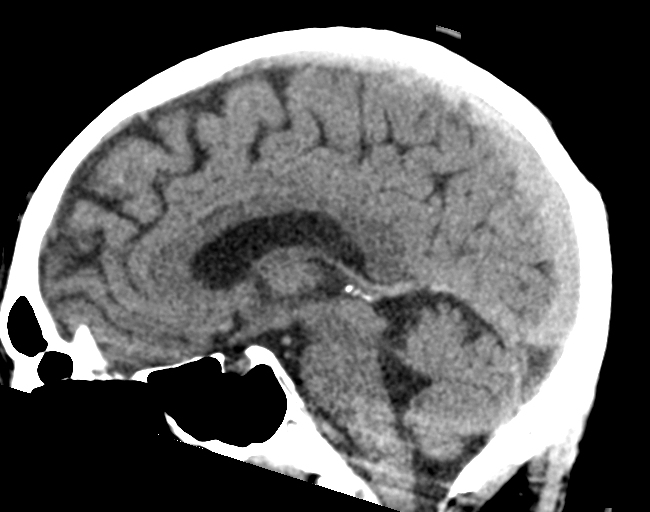
[im 37/56  brain]
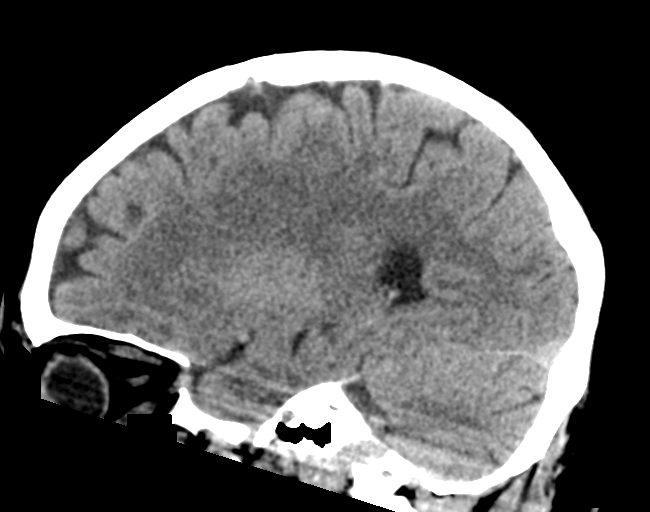

[16 of 47 positions shown; findings below may reference images not displayed]

FINDINGS: Brain:

Mild generalized cerebral atrophy.

Small focus of hypodensity within the anterior left subinsular white
matter, suspicious for a chronic lacunar infarct (series 2, image
10).

There is no acute intracranial hemorrhage.

No demarcated cortical infarct.

No extra-axial fluid collection.

No evidence of an intracranial mass.

No midline shift.

Vascular: No hyperdense vessel.  Atherosclerotic calcifications.

Skull: Normal. Negative for fracture or focal lesion.

Sinuses/Orbits: Visualized orbits show no acute finding. Trace
mucosal thickening within the bilateral ethmoid sinuses.
IMPRESSION: No evidence of acute intracranial abnormality.

Small focus of hypodensity within the anterior left subinsular white
matter, suspicious for a chronic lacunar infarct.

Mild generalized cerebral atrophy.

Minimal mucosal thickening within the bilateral ethmoid sinuses.

## 2022-12-31 ENCOUNTER — Ambulatory Visit (INDEPENDENT_AMBULATORY_CARE_PROVIDER_SITE_OTHER): Payer: Medicare Other

## 2022-12-31 VITALS — BP 150/78 | Ht 66.0 in | Wt 152.2 lb

## 2022-12-31 DIAGNOSIS — Z0000 Encounter for general adult medical examination without abnormal findings: Secondary | ICD-10-CM

## 2022-12-31 NOTE — Progress Notes (Signed)
Subjective:   Nathan Fields is a 72 y.o. male who presents for Medicare Annual/Subsequent preventive examination.  Visit Complete: In person   Review of Systems     Cardiac Risk Factors include: advanced age (>47men, >54 women);male gender;hypertension;dyslipidemia;family history of premature cardiovascular disease     Objective:    Today's Vitals   12/31/22 1459  BP: (!) 160/80  Weight: 152 lb 3.2 oz (69 kg)  Height: 5\' 6"  (1.676 m)   Body mass index is 24.57 kg/m.     12/31/2022    3:05 PM 12/05/2021   12:08 PM 09/06/2021    6:00 PM 09/06/2021   11:59 AM 08/30/2021   12:46 PM 12/04/2020    2:31 PM 11/24/2019    2:34 PM  Advanced Directives  Does Patient Have a Medical Advance Directive? No Yes No Yes Yes Yes Yes  Type of Acupuncturist of Admire;Living will Living will;Healthcare Power of Attorney  Does patient want to make changes to medical advance directive?   No - Patient declined      Copy of Healthcare Power of Attorney in Chart?  Yes - validated most recent copy scanned in chart (See row information)    No - copy requested No - copy requested  Would patient like information on creating a medical advance directive? No - Patient declined No - Patient declined No - Patient declined        Current Medications (verified) Outpatient Encounter Medications as of 12/31/2022  Medication Sig   amLODipine (NORVASC) 2.5 MG tablet TAKE 1 TABLET BY MOUTH EVERY DAY   aspirin 81 MG tablet Take 81 mg by mouth daily.   rosuvastatin (CRESTOR) 5 MG tablet TAKE 1 TABLET BY MOUTH EVERY OTHER DAY   valsartan (DIOVAN) 40 MG tablet TAKE 1 TABLET BY MOUTH EVERY DAY   No facility-administered encounter medications on file as of 12/31/2022.    Allergies (verified) Statins, Zetia [ezetimibe], Niacin, and Niacin and related   History: Past Medical History:  Diagnosis Date   Anxiety    Aortic atherosclerosis (HCC)    Arthritis of right  foot    B12 deficiency    Bilateral carotid artery stenosis 03/21/2021   a.) Carotid doppler 03/21/2021: 50-60% pRICA, 70-99% pLICA   CAD (coronary artery disease) 09/09/2007   a.) LHC 09/09/2007: EF 60%; 30% dLM, 25% pLAD, 25% mLAD, 40% oLCx, 50% pLCx, 30% pRI, 100% pRCA with good collateral flow; no interventions opting for med mgmt.   Chronic low back pain    Depression    Diastolic dysfunction 08/16/2021   a.)  TTE 08/16/2021: EF >55%; mild AR/MR/TR, trivial PR; G1DD.   GERD (gastroesophageal reflux disease)    History of kidney stones    Hyperlipidemia    Hypertension    Murmur    Plantar fasciitis    Pneumonia    Right thyroid nodule 06/15/2021   a.) CTA neck 06/15/2021: measured 7mm.   Syncope    T2DM (type 2 diabetes mellitus) (HCC)    TIA (transient ischemic attack)    Past Surgical History:  Procedure Laterality Date   CARDIAC CATHETERIZATION     COLONOSCOPY  2011   Polyp removed, follow up in 5 years   COLONOSCOPY WITH PROPOFOL N/A 01/14/2017   Procedure: COLONOSCOPY WITH PROPOFOL;  Surgeon: Wyline Mood, MD;  Location: [facility] ENDOSCOPY;  Service: Endoscopy;  Laterality: N/A;   COLONOSCOPY WITH PROPOFOL N/A 09/17/2022   Procedure: COLONOSCOPY  WITH PROPOFOL;  Surgeon: Wyline Mood, MD;  Location: [facility] ENDOSCOPY;  Service: Gastroenterology;  Laterality: N/A;   ENDARTERECTOMY Left 09/06/2021   Procedure: ENDARTERECTOMY CAROTID;  Surgeon: Annice Needy, MD;  Location: [facility] ORS;  Service: Vascular;  Laterality: Left;   ESOPHAGOGASTRODUODENOSCOPY (EGD) WITH PROPOFOL N/A 09/17/2022   Procedure: ESOPHAGOGASTRODUODENOSCOPY (EGD) WITH PROPOFOL;  Surgeon: Wyline Mood, MD;  Location: [facility] ENDOSCOPY;  Service: Gastroenterology;  Laterality: N/A;   Family History  Problem Relation Age of Onset   Heart disease Mother    Hyperlipidemia Mother    Diabetes Mother    Congestive Heart Failure Mother    Hypertension Mother    Diabetes Father    Heart attack Father    Hyperlipidemia Father     Social History   Socioeconomic History   Marital status: Married    Spouse name: Hilda Lias   Number of children: 2   Years of education: 9th grade   Highest education level: Not on file  Occupational History   Occupation: employed  Tobacco Use   Smoking status: Never   Smokeless tobacco: Never  Vaping Use   Vaping Use: Never used  Substance and Sexual Activity   Alcohol use: Not Currently    Comment: on rare occasion   Drug use: Yes    Types: Marijuana   Sexual activity: Not Currently  Other Topics Concern   Not on file  Social History Narrative   Live in the house with wife   Social Determinants of Health   Financial Resource Strain: Low Risk  (12/31/2022)   Overall Financial Resource Strain (CARDIA)    Difficulty of Paying Living Expenses: Not hard at all  Food Insecurity: No Food Insecurity (12/31/2022)   Hunger Vital Sign    Worried About Running Out of Food in the Last Year: Never true    Ran Out of Food in the Last Year: Never true  Transportation Needs: No Transportation Needs (12/31/2022)   PRAPARE - Administrator, Civil Service (Medical): No    Lack of Transportation (Non-Medical): No  Physical Activity: Sufficiently Active (12/31/2022)   Exercise Vital Sign    Days of Exercise per Week: 5 days    Minutes of Exercise per Session: 60 min  Stress: No Stress Concern Present (12/31/2022)   [employer] of Occupational Health - Occupational Stress Questionnaire    Feeling of Stress : Not at all  Social Connections: Socially Isolated (12/31/2022)   Social Connection and Isolation Panel [NHANES]    Frequency of Communication with Friends and Family: Never    Frequency of Social Gatherings with Friends and Family: Never    Attends Religious Services: Never    Database administrator or Organizations: No    Attends Engineer, structural: Never    Marital Status: Married    Tobacco Counseling Counseling given: Not Answered   Clinical  Intake:  Pre-visit preparation completed: Yes  Pain : No/denies pain     Nutritional Risks: None Diabetes: No  How often do you need to have someone help you when you read instructions, pamphlets, or other written materials from your doctor or pharmacy?: 1 - Never  Interpreter Needed?: No  Information entered by :: Kennedy Bucker, LPN   Activities of Daily Living    12/31/2022    3:06 PM 12/27/2022    1:45 PM  In your present state of health, do you have any difficulty performing the following activities:  Hearing? 0 0  Vision? 0 1  Difficulty concentrating or making decisions? 0 1  Walking or climbing stairs? 0 0  Dressing or bathing? 0 0  Doing errands, shopping? 0 0  Preparing Food and eating ? N N  Using the Toilet? N N  In the past six months, have you accidently leaked urine? N N  Do you have problems with loss of bowel control? N N  Managing your Medications? N N  Managing your Finances? N N  Housekeeping or managing your Housekeeping? N N    Patient Care Team: Dorcas Carrow, DO as PCP - General (Family Medicine) Laurier Nancy, MD as Consulting Physician (Cardiology)  Indicate any recent Medical Services you may have received from other than [facility] providers in the past year (date may be approximate).     Assessment:   This is a routine wellness examination for [place].  Hearing/Vision screen Hearing Screening - Comments:: No aids Vision Screening - Comments:: Glasses for driving, doesn't wear them-[facility]  Dietary issues and exercise activities discussed:     Goals Addressed             This Visit's Progress    DIET - EAT MORE FRUITS AND VEGETABLES         Depression Screen    12/31/2022    3:03 PM 03/21/2022    4:26 PM 12/05/2021   12:13 PM 04/17/2021    2:49 PM 03/06/2021    8:53 AM 12/04/2020    2:32 PM 11/28/2020    4:14 PM  PHQ 2/9 Scores  PHQ - 2 Score 0 0 2 0 0 0 0  PHQ- 9 Score 0 5 5 1    0    Fall Risk    12/31/2022    3:06 PM  12/27/2022    1:45 PM 11/25/2022   11:22 AM 03/21/2022    4:26 PM 12/05/2021   12:08 PM  Fall Risk   Falls in the past year? 0 0 0 0 0  Number falls in past yr: 0 0 0 0 0  Injury with Fall? 0 0 0 0 0  Risk for fall due to : No Fall Risks   No Fall Risks   Follow up Falls prevention discussed;Falls evaluation completed   Falls evaluation completed Falls evaluation completed;Education provided;Falls prevention discussed    MEDICARE RISK AT HOME:  Medicare Risk at Home - 12/31/22 1507     Any stairs in or around the home? Yes    If so, are there any without handrails? No    Home free of loose throw rugs in walkways, pet beds, electrical cords, etc? Yes    Adequate lighting in your home to reduce risk of falls? Yes    Life alert? No    Use of a cane, walker or w/c? No    Grab bars in the bathroom? No    Shower chair or bench in shower? No    Elevated toilet seat or a handicapped toilet? No             TIMED UP AND GO:  Was the test performed?  Yes  Length of time to ambulate 10 feet: 4 sec Gait steady and fast without use of assistive device    Cognitive Function:        12/31/2022    3:07 PM 12/05/2021   12:08 PM 12/04/2020    2:33 PM 02/23/2018    2:02 PM 10/08/2016    2:38 PM  6CIT Screen  What Year? 0 points 0  points 0 points 0 points 0 points  What month? 3 points 3 points 0 points 3 points 3 points  What time? 0 points 0 points 0 points 3 points 0 points  Count back from 20 0 points 0 points 0 points 0 points 0 points  Months in reverse 4 points 4 points 4 points 0 points 0 points  Repeat phrase 2 points 2 points 6 points 4 points 4 points  Total Score 9 points 9 points 10 points 10 points 7 points    Immunizations Immunization History  Administered Date(s) Administered   Influenza, High Dose Seasonal PF 05/22/2018    TDAP status: Due, Education has been provided regarding the importance of this vaccine. Advised may receive this vaccine at local pharmacy or Health  Dept. Aware to provide a copy of the vaccination record if obtained from local pharmacy or Health Dept. Verbalized acceptance and understanding.  Flu Vaccine status: Declined, Education has been provided regarding the importance of this vaccine but patient still declined. Advised may receive this vaccine at local pharmacy or Health Dept. Aware to provide a copy of the vaccination record if obtained from local pharmacy or Health Dept. Verbalized acceptance and understanding.  Pneumococcal vaccine status: Declined,  Education has been provided regarding the importance of this vaccine but patient still declined. Advised may receive this vaccine at local pharmacy or Health Dept. Aware to provide a copy of the vaccination record if obtained from local pharmacy or Health Dept. Verbalized acceptance and understanding.   Covid-19 vaccine status: Declined, Education has been provided regarding the importance of this vaccine but patient still declined. Advised may receive this vaccine at local pharmacy or Health Dept.or vaccine clinic. Aware to provide a copy of the vaccination record if obtained from local pharmacy or Health Dept. Verbalized acceptance and understanding.  Qualifies for Shingles Vaccine? Yes   Zostavax completed No   Shingrix Completed?: No.    Education has been provided regarding the importance of this vaccine. Patient has been advised to call insurance company to determine out of pocket expense if they have not yet received this vaccine. Advised may also receive vaccine at local pharmacy or Health Dept. Verbalized acceptance and understanding.  Screening Tests Health Maintenance  Topic Date Due   COVID-19 Vaccine (1) Never done   DTaP/Tdap/Td (1 - Tdap) Never done   Zoster Vaccines- Shingrix (1 of 2) Never done   Pneumonia Vaccine 37+ Years old (1 of 1 - PCV) Never done   INFLUENZA VACCINE  01/30/2023   Medicare Annual Wellness (AWV)  12/31/2023   Colonoscopy  09/17/2027   Hepatitis C  Screening  Completed   HPV VACCINES  Aged Out    Health Maintenance  Health Maintenance Due  Topic Date Due   COVID-19 Vaccine (1) Never done   DTaP/Tdap/Td (1 - Tdap) Never done   Zoster Vaccines- Shingrix (1 of 2) Never done   Pneumonia Vaccine 73+ Years old (1 of 1 - PCV) Never done    Colorectal cancer screening: Type of screening: Colonoscopy. Completed 09/17/22. Repeat every 5 years  Lung Cancer Screening: (Low Dose CT Chest recommended if Age 42-80 years, 20 pack-year currently smoking OR have quit w/in 15years.) does not qualify.   Additional Screening:  Hepatitis C Screening: does qualify; Completed 08/16/14  Vision Screening: Recommended annual ophthalmology exams for early detection of glaucoma and other disorders of the eye. Is the patient up to date with their annual eye exam?  Yes  Who is the  provider or what is the name of the office in which the patient attends annual eye exams? [facility] If pt is not established with a provider, would they like to be referred to a provider to establish care? No .   Dental Screening: Recommended annual dental exams for proper oral hygiene   Community Resource Referral / Chronic Care Management: CRR required this visit?  No   CCM required this visit?  No     Plan:     I have personally reviewed and noted the following in the patient's chart:   Medical and social history Use of alcohol, tobacco or illicit drugs  Current medications and supplements including opioid prescriptions. Patient is not currently taking opioid prescriptions. Functional ability and status Nutritional status Physical activity Advanced directives List of other physicians Hospitalizations, surgeries, and ER visits in previous 12 months Vitals Screenings to include cognitive, depression, and falls Referrals and appointments  In addition, I have reviewed and discussed with patient certain preventive protocols, quality metrics, and best practice  recommendations. A written personalized care plan for preventive services as well as general preventive health recommendations were provided to patient.     Hal Hope, LPN   01/29/1912   After Visit Summary: (MyChart) Due to this being a telephonic visit, the after visit summary with patients personalized plan was offered to patient via MyChart   Nurse Notes: none

## 2022-12-31 NOTE — Patient Instructions (Signed)
Nathan Fields , Thank you for taking time to come for your Medicare Wellness Visit. I appreciate your ongoing commitment to your health goals. Please review the following plan we discussed and let me know if I can assist you in the future.   These are the goals we discussed:  Goals      DIET - EAT MORE FRUITS AND VEGETABLES     Patient Stated     12/04/2020, wants to lose a little weight     Patient Stated     No goals        This is a list of the screening recommended for you and due dates:  Health Maintenance  Topic Date Due   COVID-19 Vaccine (1) Never done   DTaP/Tdap/Td vaccine (1 - Tdap) Never done   Zoster (Shingles) Vaccine (1 of 2) Never done   Pneumonia Vaccine (1 of 1 - PCV) Never done   Flu Shot  01/30/2023   Medicare Annual Wellness Visit  12/31/2023   Colon Cancer Screening  09/17/2027   Hepatitis C Screening  Completed   HPV Vaccine  Aged Out    Advanced directives: no  Conditions/risks identified: none  Next appointment: Follow up in one year for your annual wellness visit. 01/06/24 @ 11:15 am in person  Preventive Care 65 Years and Older, Male  Preventive care refers to lifestyle choices and visits with your health care provider that can promote health and wellness. What does preventive care include? A yearly physical exam. This is also called an annual well check. Dental exams once or twice a year. Routine eye exams. Ask your health care provider how often you should have your eyes checked. Personal lifestyle choices, including: Daily care of your teeth and gums. Regular physical activity. Eating a healthy diet. Avoiding tobacco and drug use. Limiting alcohol use. Practicing safe sex. Taking low doses of aspirin every day. Taking vitamin and mineral supplements as recommended by your health care provider. What happens during an annual well check? The services and screenings done by your health care provider during your annual well check will depend on  your age, overall health, lifestyle risk factors, and family history of disease. Counseling  Your health care provider may ask you questions about your: Alcohol use. Tobacco use. Drug use. Emotional well-being. Home and relationship well-being. Sexual activity. Eating habits. History of falls. Memory and ability to understand (cognition). Work and work Astronomer. Screening  You may have the following tests or measurements: Height, weight, and BMI. Blood pressure. Lipid and cholesterol levels. These may be checked every 5 years, or more frequently if you are over 70 years old. Skin check. Lung cancer screening. You may have this screening every year starting at age 36 if you have a 30-pack-year history of smoking and currently smoke or have quit within the past 15 years. Fecal occult blood test (FOBT) of the stool. You may have this test every year starting at age 65. Flexible sigmoidoscopy or colonoscopy. You may have a sigmoidoscopy every 5 years or a colonoscopy every 10 years starting at age 39. Prostate cancer screening. Recommendations will vary depending on your family history and other risks. Hepatitis C blood test. Hepatitis B blood test. Sexually transmitted disease (STD) testing. Diabetes screening. This is done by checking your blood sugar (glucose) after you have not eaten for a while (fasting). You may have this done every 1-3 years. Abdominal aortic aneurysm (AAA) screening. You may need this if you are a current or  former smoker. Osteoporosis. You may be screened starting at age 59 if you are at high risk. Talk with your health care provider about your test results, treatment options, and if necessary, the need for more tests. Vaccines  Your health care provider may recommend certain vaccines, such as: Influenza vaccine. This is recommended every year. Tetanus, diphtheria, and acellular pertussis (Tdap, Td) vaccine. You may need a Td booster every 10 years. Zoster  vaccine. You may need this after age 76. Pneumococcal 13-valent conjugate (PCV13) vaccine. One dose is recommended after age 57. Pneumococcal polysaccharide (PPSV23) vaccine. One dose is recommended after age 3. Talk to your health care provider about which screenings and vaccines you need and how often you need them. This information is not intended to replace advice given to you by your health care provider. Make sure you discuss any questions you have with your health care provider. Document Released: 07/14/2015 Document Revised: 03/06/2016 Document Reviewed: 04/18/2015 Elsevier Interactive Patient Education  2017 [employer].  Fall Prevention in the Home Falls can cause injuries. They can happen to people of all ages. There are many things you can do to make your home safe and to help prevent falls. What can I do on the outside of my home? Regularly fix the edges of walkways and driveways and fix any cracks. Remove anything that might make you trip as you walk through a door, such as a raised step or threshold. Trim any bushes or trees on the path to your home. Use bright outdoor lighting. Clear any walking paths of anything that might make someone trip, such as rocks or tools. Regularly check to see if handrails are loose or broken. Make sure that both sides of any steps have handrails. Any raised decks and porches should have guardrails on the edges. Have any leaves, snow, or ice cleared regularly. Use sand or salt on walking paths during winter. Clean up any spills in your garage right away. This includes oil or grease spills. What can I do in the bathroom? Use night lights. Install grab bars by the toilet and in the tub and shower. Do not use towel bars as grab bars. Use non-skid mats or decals in the tub or shower. If you need to sit down in the shower, use a plastic, non-slip stool. Keep the floor dry. Clean up any water that spills on the floor as soon as it happens. Remove  soap buildup in the tub or shower regularly. Attach bath mats securely with double-sided non-slip rug tape. Do not have throw rugs and other things on the floor that can make you trip. What can I do in the bedroom? Use night lights. Make sure that you have a light by your bed that is easy to reach. Do not use any sheets or blankets that are too big for your bed. They should not hang down onto the floor. Have a firm chair that has side arms. You can use this for support while you get dressed. Do not have throw rugs and other things on the floor that can make you trip. What can I do in the kitchen? Clean up any spills right away. Avoid walking on wet floors. Keep items that you use a lot in easy-to-reach places. If you need to reach something above you, use a strong step stool that has a grab bar. Keep electrical cords out of the way. Do not use floor polish or wax that makes floors slippery. If you must use wax, use  non-skid floor wax. Do not have throw rugs and other things on the floor that can make you trip. What can I do with my stairs? Do not leave any items on the stairs. Make sure that there are handrails on both sides of the stairs and use them. Fix handrails that are broken or loose. Make sure that handrails are as long as the stairways. Check any carpeting to make sure that it is firmly attached to the stairs. Fix any carpet that is loose or worn. Avoid having throw rugs at the top or bottom of the stairs. If you do have throw rugs, attach them to the floor with carpet tape. Make sure that you have a light switch at the top of the stairs and the bottom of the stairs. If you do not have them, ask someone to add them for you. What else can I do to help prevent falls? Wear shoes that: Do not have high heels. Have rubber bottoms. Are comfortable and fit you well. Are closed at the toe. Do not wear sandals. If you use a stepladder: Make sure that it is fully opened. Do not climb a  closed stepladder. Make sure that both sides of the stepladder are locked into place. Ask someone to hold it for you, if possible. Clearly mark and make sure that you can see: Any grab bars or handrails. First and last steps. Where the edge of each step is. Use tools that help you move around (mobility aids) if they are needed. These include: Canes. Walkers. Scooters. Crutches. Turn on the lights when you go into a dark area. Replace any light bulbs as soon as they burn out. Set up your furniture so you have a clear path. Avoid moving your furniture around. If any of your floors are uneven, fix them. If there are any pets around you, be aware of where they are. Review your medicines with your doctor. Some medicines can make you feel dizzy. This can increase your chance of falling. Ask your doctor what other things that you can do to help prevent falls. This information is not intended to replace advice given to you by your health care provider. Make sure you discuss any questions you have with your health care provider. Document Released: 04/13/2009 Document Revised: 11/23/2015 Document Reviewed: 07/22/2014 Elsevier Interactive Patient Education  2017 [employer].

## 2023-01-12 ENCOUNTER — Other Ambulatory Visit: Payer: Self-pay | Admitting: Family Medicine

## 2023-01-13 NOTE — Telephone Encounter (Signed)
Requested medications are due for refill today.  yes  Requested medications are on the active medications list.  yes  Last refill. 11/06/2022 #30 1 rf  Future visit scheduled.   no  Notes to clinic.  Pt is more than 3 months overdue for an OV.    Requested Prescriptions  Pending Prescriptions Disp Refills   valsartan (DIOVAN) 40 MG tablet [Pharmacy Med Name: VALSARTAN 40 MG TABLET] 30 tablet 1    Sig: TAKE 1 TABLET BY MOUTH EVERY DAY     Cardiovascular:  Angiotensin Receptor Blockers Failed - 01/12/2023  1:24 AM      Failed - Cr in normal range and within 180 days    Creatinine  Date Value Ref Range Status  07/19/2012 1.12 0.60 - 1.30 mg/dL Final   Creatinine, Ser  Date Value Ref Range Status  03/21/2022 1.05 0.76 - 1.27 mg/dL Final         Failed - K in normal range and within 180 days    Potassium  Date Value Ref Range Status  03/21/2022 4.2 3.5 - 5.2 mmol/L Final  07/19/2012 3.6 3.5 - 5.1 mmol/L Final         Failed - Last BP in normal range    BP Readings from Last 1 Encounters:  12/31/22 (!) 150/78         Passed - Patient is not pregnant      Passed - Valid encounter within last 6 months    Recent Outpatient Visits           9 months ago Mixed hyperlipidemia   [place] [facility] [place], Megan P, DO   1 year ago TIA (transient ischemic attack)   [place] [facility] Dorcas Carrow, DO   1 year ago Neck pain   [place] [facility] [place], [place], DO   1 year ago Neck pain   [place] [facility] [place], [place] P, DO   2 years ago Coronary artery disease involving native coronary artery of native heart without angina pectoris    [facility] [place], [place], DO

## 2023-01-21 ENCOUNTER — Other Ambulatory Visit: Payer: Self-pay | Admitting: Family Medicine

## 2023-01-21 NOTE — Telephone Encounter (Signed)
Requested medication (s) are due for refill today: Yes  Requested medication (s) are on the active medication list: Yes  Last refill:  11/06/22  Future visit scheduled: No  Notes to clinic:  Patient over due for an office visit.     Requested Prescriptions  Pending Prescriptions Disp Refills   valsartan (DIOVAN) 40 MG tablet [Pharmacy Med Name: VALSARTAN 40 MG TABLET] 30 tablet 1    Sig: TAKE 1 TABLET BY MOUTH EVERY DAY     Cardiovascular:  Angiotensin Receptor Blockers Failed - 01/21/2023  1:08 PM      Failed - Cr in normal range and within 180 days    Creatinine  Date Value Ref Range Status  07/19/2012 1.12 0.60 - 1.30 mg/dL Final   Creatinine, Ser  Date Value Ref Range Status  03/21/2022 1.05 0.76 - 1.27 mg/dL Final         Failed - K in normal range and within 180 days    Potassium  Date Value Ref Range Status  03/21/2022 4.2 3.5 - 5.2 mmol/L Final  07/19/2012 3.6 3.5 - 5.1 mmol/L Final         Failed - Last BP in normal range    BP Readings from Last 1 Encounters:  12/31/22 (!) 150/78         Passed - Patient is not pregnant      Passed - Valid encounter within last 6 months    Recent Outpatient Visits           10 months ago Mixed hyperlipidemia   [place] [facility] [place], Megan P, DO   1 year ago TIA (transient ischemic attack)   [place] [facility] Dorcas Carrow, DO   1 year ago Neck pain   [place] [facility] [place], [place], DO   1 year ago Neck pain   [place] [facility] [place], [place] P, DO   2 years ago Coronary artery disease involving native coronary artery of native heart without angina pectoris   [place] [facility] [place], [place], DO

## 2023-01-21 NOTE — Telephone Encounter (Signed)
Patient is overdue for an appointment. Please call to schedule and then route to provider for refill.  

## 2023-01-21 NOTE — Telephone Encounter (Signed)
Attempted to reach patient, LVM to call office back to schedule appointment for follow up as it is overdue.  Put in CRM.

## 2023-01-21 NOTE — Telephone Encounter (Signed)
Pt scheduled appointment for 02/21/2023 @ 4:20 pm, routing to provider for refill.

## 2023-01-24 ENCOUNTER — Other Ambulatory Visit: Payer: Self-pay | Admitting: Family Medicine

## 2023-01-27 NOTE — Telephone Encounter (Signed)
Requested Prescriptions  Pending Prescriptions Disp Refills   amLODipine (NORVASC) 2.5 MG tablet [Pharmacy Med Name: AMLODIPINE BESYLATE 2.5 MG TAB] 90 tablet 0    Sig: TAKE 1 TABLET BY MOUTH EVERY DAY     Cardiovascular: Calcium Channel Blockers 2 Failed - 01/24/2023  9:44 PM      Failed - Last BP in normal range    BP Readings from Last 1 Encounters:  12/31/22 (!) 150/78         Passed - Last Heart Rate in normal range    Pulse Readings from Last 1 Encounters:  09/17/22 67         Passed - Valid encounter within last 6 months    Recent Outpatient Visits           10 months ago Mixed hyperlipidemia   [place] [facility] [place], Megan P, DO   1 year ago TIA (transient ischemic attack)   [place] [facility] Dorcas Carrow, DO   1 year ago Neck pain   [place] [facility] [place], [place], DO   1 year ago Neck pain   [place] [facility] [place], [place] P, DO   2 years ago Coronary artery disease involving native coronary artery of native heart without angina pectoris   [place] [facility] [place], Oralia Rud, DO       Future Appointments             In 3 weeks [place], Oralia Rud, DO [place] [facility]

## 2023-02-05 ENCOUNTER — Ambulatory Visit (INDEPENDENT_AMBULATORY_CARE_PROVIDER_SITE_OTHER): Payer: Medicare Other | Admitting: Family Medicine

## 2023-02-05 ENCOUNTER — Telehealth: Payer: Self-pay | Admitting: Family Medicine

## 2023-02-05 ENCOUNTER — Encounter: Payer: Self-pay | Admitting: Family Medicine

## 2023-02-05 VITALS — BP 138/64 | HR 56 | Temp 97.8°F | Wt 150.2 lb

## 2023-02-05 DIAGNOSIS — R4701 Aphasia: Secondary | ICD-10-CM | POA: Diagnosis not present

## 2023-02-05 DIAGNOSIS — R3911 Hesitancy of micturition: Secondary | ICD-10-CM | POA: Diagnosis not present

## 2023-02-05 DIAGNOSIS — R200 Anesthesia of skin: Secondary | ICD-10-CM | POA: Diagnosis not present

## 2023-02-05 DIAGNOSIS — R251 Tremor, unspecified: Secondary | ICD-10-CM | POA: Diagnosis not present

## 2023-02-05 DIAGNOSIS — E118 Type 2 diabetes mellitus with unspecified complications: Secondary | ICD-10-CM

## 2023-02-05 DIAGNOSIS — R1311 Dysphagia, oral phase: Secondary | ICD-10-CM

## 2023-02-05 DIAGNOSIS — I10 Essential (primary) hypertension: Secondary | ICD-10-CM | POA: Diagnosis not present

## 2023-02-05 DIAGNOSIS — E782 Mixed hyperlipidemia: Secondary | ICD-10-CM

## 2023-02-05 DIAGNOSIS — R7301 Impaired fasting glucose: Secondary | ICD-10-CM | POA: Diagnosis not present

## 2023-02-05 DIAGNOSIS — E538 Deficiency of other specified B group vitamins: Secondary | ICD-10-CM | POA: Diagnosis not present

## 2023-02-05 DIAGNOSIS — F419 Anxiety disorder, unspecified: Secondary | ICD-10-CM

## 2023-02-05 DIAGNOSIS — G459 Transient cerebral ischemic attack, unspecified: Secondary | ICD-10-CM

## 2023-02-05 DIAGNOSIS — F332 Major depressive disorder, recurrent severe without psychotic features: Secondary | ICD-10-CM | POA: Diagnosis not present

## 2023-02-05 DIAGNOSIS — R413 Other amnesia: Secondary | ICD-10-CM

## 2023-02-05 LAB — URINALYSIS, ROUTINE W REFLEX MICROSCOPIC
Bilirubin, UA: NEGATIVE
Glucose, UA: NEGATIVE
Ketones, UA: NEGATIVE
Leukocytes,UA: NEGATIVE
Nitrite, UA: NEGATIVE
Protein,UA: NEGATIVE
RBC, UA: NEGATIVE
Specific Gravity, UA: 1.02 (ref 1.005–1.030)
Urobilinogen, Ur: 0.2 mg/dL (ref 0.2–1.0)
pH, UA: 7 (ref 5.0–7.5)

## 2023-02-05 LAB — BAYER DCA HB A1C WAIVED: HB A1C (BAYER DCA - WAIVED): 6.5 % — ABNORMAL HIGH (ref 4.8–5.6)

## 2023-02-05 LAB — MICROALBUMIN, URINE WAIVED
Creatinine, Urine Waived: 200 mg/dL (ref 10–300)
Microalb, Ur Waived: 10 mg/L (ref 0–19)
Microalb/Creat Ratio: <30 mg/g (ref <30)

## 2023-02-05 MED ORDER — DULOXETINE HCL 20 MG PO CPEP: 20.0000 mg | ORAL_CAPSULE | Freq: Every day | ORAL | 3 refills | Status: DC

## 2023-02-05 NOTE — Patient Instructions (Signed)
Patient is scheduled with Dr Sherryll Burger at [facility] on August 8th, 2024 at 3:00 PM.

## 2023-02-05 NOTE — Progress Notes (Signed)
BP 138/64   Pulse (!) 56   Temp 97.8 F (36.6 C) (Oral)   Wt 150 lb 3.2 oz (68.1 kg)   SpO2 98%   BMI 24.24 kg/m    Subjective:    Patient ID: Nathan Fields, male    DOB: 1951/06/22, 72 y.o.   MRN: [id number]  HPI: Nathan Fields is a 72 y.o. male  Chief Complaint  Patient presents with   Depression   Numbness   Fatigue   Amjed notes that he has not been doing well. He continues to continue have trouble speaking and swallowing. He has not followed up with his neurologist and has not seen speech therapy. He has been feeling very depressed about this and notes that he continues to get worse.   HYPERTENSION / HYPERLIPIDEMIA Satisfied with current treatment? yes Duration of hypertension: chronic BP monitoring frequency: not checking BP medication side effects: no Past BP meds: amlodipine, valsartan Duration of hyperlipidemia: chronic Cholesterol medication side effects: no Cholesterol supplements: none Past cholesterol medications: crestor Medication compliance: excellent compliance Aspirin: yes Recent stressors: no Recurrent headaches: no Visual changes: no Palpitations: no Dyspnea: no Chest pain: no Lower extremity edema: no Dizzy/lightheaded: no  DEPRESSION Mood status: uncontrolled Satisfied with current treatment?: N/A Symptom severity: moderate  Duration of current treatment : N/A Psychotherapy/counseling: no  Previous psychiatric medications: none Depressed mood: yes Anxious mood: yes Anhedonia: no Significant weight loss or gain: no Insomnia: no  Fatigue: yes Feelings of worthlessness or guilt: yes Impaired concentration/indecisiveness: yes Suicidal ideations: yes Hopelessness: no Crying spells: no    02/05/2023    1:18 PM 12/31/2022    3:03 PM 03/21/2022    4:26 PM 12/05/2021   12:13 PM 04/17/2021    2:49 PM  Depression screen PHQ 2/9  Decreased Interest 3 0 0 1 0  Down, Depressed, Hopeless 1 0 0 1 0  PHQ - 2 Score 4 0 0 2 0  Altered  sleeping 2 0 0 1 1  Tired, decreased energy 3 0 2 1 0  Change in appetite 1 0 0 0 0  Feeling bad or failure about yourself  2 0 0 1 0  Trouble concentrating 3 0 1 0 0  Moving slowly or fidgety/restless 3 0 2 0 0  Suicidal thoughts 1 0 0 0 0  PHQ-9 Score 19 0 5 5 1   Difficult doing work/chores Very difficult Not difficult at all Somewhat difficult Somewhat difficult     Relevant past medical, surgical, family and social history reviewed and updated as indicated. Interim medical history since our last visit reviewed. Allergies and medications reviewed and updated.  Review of Systems  Constitutional: Negative.   HENT:  Positive for trouble swallowing. Negative for congestion, dental problem, drooling, ear discharge, ear pain, facial swelling, hearing loss, mouth sores, nosebleeds, postnasal drip, rhinorrhea, sinus pressure, sinus pain, sneezing, sore throat, tinnitus and voice change.   Respiratory: Negative.    Cardiovascular: Negative.   Gastrointestinal: Negative.   Musculoskeletal: Negative.   Skin: Negative.   Neurological:  Positive for tremors, speech difficulty and weakness. Negative for dizziness, seizures, syncope, facial asymmetry, light-headedness, numbness and headaches.  Psychiatric/Behavioral:  Positive for dysphoric mood. Negative for agitation, behavioral problems, confusion, decreased concentration, hallucinations, self-injury, sleep disturbance and suicidal ideas. The patient is nervous/anxious. The patient is not hyperactive.     Per HPI unless specifically indicated above     Objective:    BP 138/64   Pulse (!) 56  Temp 97.8 F (36.6 C) (Oral)   Wt 150 lb 3.2 oz (68.1 kg)   SpO2 98%   BMI 24.24 kg/m   Wt Readings from Last 3 Encounters:  02/05/23 150 lb 3.2 oz (68.1 kg)  12/31/22 152 lb 3.2 oz (69 kg)  09/17/22 157 lb 6.4 oz (71.4 kg)    Physical Exam Vitals and nursing note reviewed.  Constitutional:      General: He is not in acute distress.     Appearance: Normal appearance. He is normal weight. He is not ill-appearing, toxic-appearing or diaphoretic.  HENT:     Head: Normocephalic and atraumatic.     Right Ear: External ear normal.     Left Ear: External ear normal.     Nose: Nose normal.     Mouth/Throat:     Mouth: Mucous membranes are moist.     Pharynx: Oropharynx is clear.  Eyes:     General: No scleral icterus.       Right eye: No discharge.        Left eye: No discharge.     Extraocular Movements: Extraocular movements intact.     Conjunctiva/sclera: Conjunctivae normal.     Pupils: Pupils are equal, round, and reactive to light.  Cardiovascular:     Rate and Rhythm: Normal rate and regular rhythm.     Pulses: Normal pulses.     Heart sounds: Normal heart sounds. No murmur heard.    No friction rub. No gallop.  Pulmonary:     Effort: Pulmonary effort is normal. No respiratory distress.     Breath sounds: Normal breath sounds. No stridor. No wheezing, rhonchi or rales.  Chest:     Chest wall: No tenderness.  Musculoskeletal:        General: Normal range of motion.     Cervical back: Normal range of motion and neck supple.  Skin:    General: Skin is warm and dry.     Capillary Refill: Capillary refill takes less than 2 seconds.     Coloration: Skin is not jaundiced or pale.     Findings: No bruising, erythema, lesion or rash.  Neurological:     General: No focal deficit present.     Mental Status: He is alert and oriented to person, place, and time. Mental status is at baseline.  Psychiatric:        Mood and Affect: Mood normal.        Behavior: Behavior normal.        Thought Content: Thought content normal.        Judgment: Judgment normal.     Results for orders placed or performed in visit on 02/05/23  Comprehensive metabolic panel  Result Value Ref Range   Glucose 94 70 - 99 mg/dL   BUN 15 8 - 27 mg/dL   Creatinine, Ser 1.61 0.76 - 1.27 mg/dL   eGFR 76 >09 UE/AVW/0.98   BUN/Creatinine Ratio 14  10 - 24   Sodium 143 134 - 144 mmol/L   Potassium 4.6 3.5 - 5.2 mmol/L   Chloride 105 96 - 106 mmol/L   CO2 24 20 - 29 mmol/L   Calcium 9.6 8.6 - 10.2 mg/dL   Total Protein 6.6 6.0 - 8.5 g/dL   Albumin 4.5 3.8 - 4.8 g/dL   Globulin, Total 2.1 1.5 - 4.5 g/dL   Bilirubin Total 0.3 0.0 - 1.2 mg/dL   Alkaline Phosphatase 95 44 - 121 IU/L   AST 15 0 -  40 IU/L   ALT 18 0 - 44 IU/L  CBC with Differential/Platelet  Result Value Ref Range   WBC 6.5 3.4 - 10.8 x10E3/uL   RBC 4.51 4.14 - 5.80 x10E6/uL   Hemoglobin 13.5 13.0 - 17.7 g/dL   Hematocrit 13.2 44.0 - 51.0 %   MCV 89 79 - 97 fL   MCH 29.9 26.6 - 33.0 pg   MCHC 33.5 31.5 - 35.7 g/dL   RDW 10.2 72.5 - 36.6 %   Platelets 220 150 - 450 x10E3/uL   Neutrophils 58 Not Estab. %   Lymphs 31 Not Estab. %   Monocytes 8 Not Estab. %   Eos 2 Not Estab. %   Basos 1 Not Estab. %   Neutrophils Absolute 3.8 1.4 - 7.0 x10E3/uL   Lymphocytes Absolute 2.0 0.7 - 3.1 x10E3/uL   Monocytes Absolute 0.5 0.1 - 0.9 x10E3/uL   EOS (ABSOLUTE) 0.1 0.0 - 0.4 x10E3/uL   Basophils Absolute 0.1 0.0 - 0.2 x10E3/uL   Immature Granulocytes 0 Not Estab. %   Immature Grans (Abs) 0.0 0.0 - 0.1 x10E3/uL  Lipid Panel w/o Chol/HDL Ratio  Result Value Ref Range   Cholesterol, Total 209 (H) 100 - 199 mg/dL   Triglycerides 440 (H) 0 - 149 mg/dL   HDL 49 >34 mg/dL   VLDL Cholesterol Cal 27 5 - 40 mg/dL   LDL Chol Calc (NIH) 742 (H) 0 - 99 mg/dL  PSA  Result Value Ref Range   Prostate Specific Ag, Serum 1.5 0.0 - 4.0 ng/mL  TSH  Result Value Ref Range   TSH 1.750 0.450 - 4.500 uIU/mL  Urinalysis, Routine w reflex microscopic  Result Value Ref Range   Specific Gravity, UA 1.020 1.005 - 1.030   pH, UA 7.0 5.0 - 7.5   Color, UA Yellow Yellow   Appearance Ur Clear Clear   Leukocytes,UA Negative Negative   Protein,UA Negative Negative/Trace   Glucose, UA Negative Negative   Ketones, UA Negative Negative   RBC, UA Negative Negative   Bilirubin, UA Negative  Negative   Urobilinogen, Ur 0.2 0.2 - 1.0 mg/dL   Nitrite, UA Negative Negative   Microscopic Examination Comment   Microalbumin, Urine Waived  Result Value Ref Range   Microalb, Ur Waived 10 0 - 19 mg/L   Creatinine, Urine Waived 200 10 - 300 mg/dL   Microalb/Creat Ratio <30 <30 mg/g  Bayer DCA Hb A1c Waived  Result Value Ref Range   HB A1C (BAYER DCA - WAIVED) 6.5 (H) 4.8 - 5.6 %  B12  Result Value Ref Range   Vitamin B-12 572 232 - 1,245 pg/mL      Assessment & Plan:   Problem List Items Addressed This Visit       Cardiovascular and Mediastinum   Hypertension    Under good control on current regimen. Continue current regimen. Continue to monitor. Call with any concerns. Refills given. Labs drawn today.        Relevant Orders   Comprehensive metabolic panel (Completed)   CBC with Differential/Platelet (Completed)   TSH (Completed)   Urinalysis, Routine w reflex microscopic (Completed)   Microalbumin, Urine Waived (Completed)   TIA (transient ischemic attack)    Will get him back into see neurology. Appointment scheduled today. Call with any concerns. Continue to monitor.       Relevant Orders   Ambulatory referral to Neurology     Digestive   Swallowing difficulty    Never ended up seeing  speech therapy. Appointment scheduled today. Await their input.       Relevant Orders   Ambulatory referral to Speech Therapy     Endocrine   Controlled diabetes mellitus type 2 with complications (HCC)    Newly diagnosed with A1c of 6.5. Will work on diet and exercise and continue to monitor.       RESOLVED: Impaired fasting glucose   Relevant Orders   Comprehensive metabolic panel (Completed)   CBC with Differential/Platelet (Completed)   Bayer DCA Hb A1c Waived (Completed)     Other   Hyperlipidemia    Under good control on current regimen. Continue current regimen. Continue to monitor. Call with any concerns. Refills given. Labs drawn today.        Relevant  Orders   Comprehensive metabolic panel (Completed)   CBC with Differential/Platelet (Completed)   Lipid Panel w/o Chol/HDL Ratio (Completed)   Depression    Will start him on cymbalta. Recheck it in about a month. Call with any concerns.       Relevant Medications   DULoxetine (CYMBALTA) 20 MG capsule   Acute anxiety    Will start him on cymbalta. Recheck it in about a month. Call with any concerns.        Relevant Medications   DULoxetine (CYMBALTA) 20 MG capsule   B12 deficiency    Rechecking labs today. Await results. Treat as needed.       Relevant Orders   B12 (Completed)   Facial numbness    Will get him back into see neurology. Appointment scheduled today.       Relevant Orders   Ambulatory referral to Neurology   Ambulatory referral to Speech Therapy   Other Visit Diagnoses     Aphasia    -  Primary   Will get him into neurology. Call with any concerns.   Relevant Orders   Ambulatory referral to Speech Therapy   Tremor       Will get him into neurology. Call with any concerns.   Relevant Orders   Ambulatory referral to Neurology   Hesitancy       Labs drawn today. await results.   Relevant Orders   PSA (Completed)   Memory loss       Will get him into neurology. Call with any concerns.   Relevant Orders   Ambulatory referral to Neurology        Follow up plan: Return in about 5 weeks (around 03/12/2023).

## 2023-02-05 NOTE — Telephone Encounter (Signed)
Can you please call him and let him know that he [facility] @ [facility]

## 2023-02-05 NOTE — Telephone Encounter (Signed)
Called and informed patient of appointment. Patient expressed understanding

## 2023-02-07 ENCOUNTER — Encounter: Payer: Self-pay | Admitting: Family Medicine

## 2023-02-07 DIAGNOSIS — E118 Type 2 diabetes mellitus with unspecified complications: Secondary | ICD-10-CM | POA: Insufficient documentation

## 2023-02-09 ENCOUNTER — Encounter: Payer: Self-pay | Admitting: Family Medicine

## 2023-02-09 NOTE — Assessment & Plan Note (Signed)
Under good control on current regimen. Continue current regimen. Continue to monitor. Call with any concerns. Refills given. Labs drawn today.   

## 2023-02-09 NOTE — Assessment & Plan Note (Signed)
Rechecking labs today. Await results. Treat as needed.  °

## 2023-02-09 NOTE — Assessment & Plan Note (Signed)
Will get him back into see neurology. Appointment scheduled today. Call with any concerns. Continue to monitor.

## 2023-02-09 NOTE — Assessment & Plan Note (Signed)
Newly diagnosed with A1c of 6.5. Will work on diet and exercise and continue to monitor.

## 2023-02-09 NOTE — Assessment & Plan Note (Signed)
Will start him on cymbalta. Recheck it in about a month. Call with any concerns.

## 2023-02-09 NOTE — Assessment & Plan Note (Signed)
Never ended up seeing speech therapy. Appointment scheduled today. Await their input.

## 2023-02-09 NOTE — Assessment & Plan Note (Signed)
Will get him back into see neurology. Appointment scheduled today.

## 2023-02-13 ENCOUNTER — Ambulatory Visit: Payer: Medicare Other | Attending: Family Medicine | Admitting: Speech Pathology

## 2023-02-13 DIAGNOSIS — R200 Anesthesia of skin: Secondary | ICD-10-CM | POA: Insufficient documentation

## 2023-02-13 DIAGNOSIS — R1310 Dysphagia, unspecified: Secondary | ICD-10-CM | POA: Diagnosis not present

## 2023-02-13 DIAGNOSIS — R1311 Dysphagia, oral phase: Secondary | ICD-10-CM | POA: Diagnosis not present

## 2023-02-13 DIAGNOSIS — R4701 Aphasia: Secondary | ICD-10-CM | POA: Diagnosis not present

## 2023-02-13 DIAGNOSIS — R471 Dysarthria and anarthria: Secondary | ICD-10-CM | POA: Insufficient documentation

## 2023-02-13 DIAGNOSIS — R41841 Cognitive communication deficit: Secondary | ICD-10-CM | POA: Insufficient documentation

## 2023-02-13 NOTE — Therapy (Signed)
OUTPATIENT SPEECH LANGUAGE PATHOLOGY COGNITIVE COMMUNICATION & SWALLOW EVALUATION    PCP: Olevia Perches, DO  REFERRING PROVIDER: Olevia Perches, DO   End of Session - 02/13/23 1518     Visit Number 1    Number of Visits 12    Date for SLP Re-Evaluation 05/08/23    SLP Start Time 1410    SLP Stop Time  1500    SLP Time Calculation (min) 50 min    Activity Tolerance Patient tolerated treatment well             No past medical history on file.  SmartLink. Patient Active Problem List   Diagnosis Date Noted   Controlled diabetes mellitus type 2 with complications (HCC) 02/07/2023   Facial numbness 03/21/2022   Swallowing difficulty 02/12/2022   Carotid stenosis, asymptomatic, bilateral 09/06/2021   Carotid stenosis 08/02/2021   TIA (transient ischemic attack) 08/02/2021   Traumatic rupture of biceps tendon 12/02/2017   Acute anxiety 09/05/2016   B12 deficiency 09/05/2016   Depression 01/08/2016   Right foot drop 06/13/2015   Illiterate 12/21/2014   Arthritis    Hyperlipidemia    CAD (coronary artery disease)    Hypertension    Chronic low back pain    Hematuria 07/13/2012   Chronic pain 01/29/2012   Colon polyp 01/29/2012   Nephrolithiasis 01/29/2012   Somatization disorder 01/29/2012    ONSET DATE: 02/05/2023 (referral); March 2023 (onset)  REFERRING DIAG: aphasia, dysphagia  THERAPY DIAG:  Dysphagia, unspecified type  Dysarthria and anarthria  Cognitive communication deficit  Rationale for Evaluation and Treatment Rehabilitation  SUBJECTIVE:   SUBJECTIVE STATEMENT: "This has been going on for 2 years." (Regarding speech, cognitive, and swallowing changes)  Pt accompanied by: significant other  PERTINENT HISTORY: endarterectomies March 2023; EGD March 2024 - no evidence of EOE, diverticulousis...; MRI   DIAGNOSTIC FINDINGS: MBSS March 2024 - regular diet with thin liquids; MRI 12/26/21 "1. No acute intracranial abnormality. 2. Mild chronic  microvascular ischemic changes of the white matter."  PAIN:  Are you having pain? No   FALLS: Has patient fallen in last 6 months?  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; owns pool [employer]    PATIENT GOALS   "for my swallow to improve"  OBJECTIVE:   COGNITIVE COMMUNICATION: Overall cognitive status: Impaired Areas of impairment:  Oriented to person Oriented to place Oriented to situation Attention: Impaired: Sustained Memory: Impaired: Immediate Working Teacher, music term Functional Awareness: WFL Behavior: Within functional limits Appears Intact Auditory comprehension: WFL Verbal expression: WFL Functional communication: WFL   AUDITORY COMPREHENSION: WFL   READING COMPREHENSION: Impaired: low level of literacy due to dyslexia  EXPRESSION: verbal  VERBAL EXPRESSION: Level of generative/spontaneous verbalization: sentence and conversation Automatic speech: name: intact and social response: intact  Repetition: Appears intact Naming: WFL Pragmatics: Appears intact Interfering components: mild articulatory imprecision   WRITTEN EXPRESSION: Dominant hand: right   Written expression: Not tested  MOTOR SPEECH: Overall motor speech: impaired Level of impairment: Word, Phrase, Sentence, and Conversation Respiration: diaphragmatic/abdominal breathing Phonation: normal Resonance: WFL Articulation: Impaired: word, phrase, sentence, and conversation Intelligibility: Intelligible Motor planning: Appears intact Effective technique: slow rate, increased vocal intensity, and over articulate  ORAL MOTOR EXAMINATION: Facial : Sensation impaired: Impaired left, Suspect CN V impairment Lingual: Symmetry Impaired: Impaired left, Suspect CN XII impairment, Strength Impaired: Impaired left,  Sensation  Impaired: Impaired left Velum: WFL Mandible: WFL Cough: WFL Voice: WFL   STANDARDIZED ASSESSMENTS: [place]'s Cognitive Examination - ACE III - American Version C Attempted - unable to complete due to pt's inability to read and visual deficits (cataracts)   CLINICAL SWALLOW ASSESSMENT:   Current diet: regular and thin liquids Dentition: adequate natural dentition Feeding: able to feed self Consistencies tested: Thin Liquid: Presentation: Cup Oral Phase: WFL Pharyngeal Phase: Impaired: delayed cough Puree: Presentation: Straw and Self-fed Oral Phase: WFL Pharyngeal Phase: WFL Regular: Presentation: By hand and Self-fed Oral Phase: Impaired: impaired mastication, prolonged mastication, and prolonged bolus formation Pharyngeal Phase: WFL   Evaluation findings: Patient presents with s/sx of (oral, pharyngeal, oropharyngeal) dysphagia characterized by prolonged mastication and A-P transit with solids. Concern for suspected pharyngeal dysphagia given delayed cough x1 with sequential cups sips.   Aspiration risk factors:History of dysphagia and History of GERD Overall aspiration risk:Mild Diet Recommendations: regular and thin liquids Precautions:Minimize environmental distractions, Slow rate, Small sips/bites, Seated upright 90 degrees, Remain upright for at least 30 minutes after meals, Lingual sweep for clearance of pocketing, and Follow solids with liquid Supervision: Patient able to feed self Oral care recommendations:Oral care BID and Patient independent with oral care Follow-up recommendations: MBSS     PATIENT REPORTED OUTCOME MEASURES (PROM):  MULTIFACTORIAL MEMORY QUESTIONNAIRE (MMQ)  Administered patient self-reported outcome measure Multifactorial Memory Questionnaire (MMQ). The Multifactorial Memory Questionnaire [facility]) consists of three scales measuring separate aspects of metamemory; Satisfaction, Ability and Strategy.   Pt's responses are converted to  T-Scores with severity levels based on pt's T-Score.   Severity Levels (T-score) Very Low - < 20 Low - 20 to 29 Below Average - 30-39 Average - 40 to 60 Above Average - 60 to 70 High - 71 to 80 Very High - > 80  Pt reports:  Very Low - < 20 Memory Satisfaction (T-score: 27) Below Average - 30-39 Memory Ability (T-score: 39) Low - 20 to 29 use of Memory Strategies (T-score: 33)    and  EATING ASSESSMENT TOOL (EAT-10)   The patient was asked to rate to what extent the following statements are problematic on a scale of 0-4. 0 = No problem; 4 = Severe problem. A total score of 3 or higher is considered abnormal.  1.) My swallowing problem has caused me to lose weight. 3 2.) My swallowing problem interferes with my ability to go out for meals. 3 3.) Swallowing liquids takes extra effort. 2 4.) Swallowing solids takes extra effort. 4   5.) Swallowing pills takes extra effort. 3 6.) Swallowing is painful. 2 7.) The pleasure of eating is affected by my swallowing. 4  8.) When I swallow food sticks in my throat. 4  9.) I cough when I eat. 2 10.) Swallowing is stressful. 4    TOTAL SCORE: 31/40   PATIENT EDUCATION: Education details: results of assessment, POC Person educated: Patient and Spouse Education method: Explanation Education comprehension: verbalized understanding and needs further education    GOALS: Goals reviewed with patient? Yes  SHORT TERM GOALS: Target date: 10 sessions  Pt will participate in repeat MBSS to establish new baseline swallow function. Baseline: Goal status: INITIAL  2.  To improve swallowing safety and efficiency, patient will verbalize and demonstrate use of compensatory strategies for improved oropharyngeal swallow function based on results of MBSS. Baseline:  Goal status: INITIAL    LONG TERM GOALS: Target date: 05/08/23  Pt will report improvement on PROM by end of course of  tx.  Baseline:  Goal status:  INITIAL  ASSESSMENT:  CLINICAL IMPRESSION: Patient is a 72 y.o. male who was seen today for cognitive-communication and dysphagia evaluation. Evaluation completed via portions of the ACE-III, informal means, clinical swallowing evaluation, and PROM. Pt and wife stated pt's speech, cognitive, and swallowing changes occurred after pt's endarterectomies. Pt presents with s/sx mild flaccid dysarthria c/b imprecise articulation across all levels of speech. Pt 100% intelligible despite dysarthria and is well-versed in compensatory strategies to improve articulatory imprecision. Cognitive-communication evaluation limited due to pt's dyslexia/illiteracy, premorbid learning disability, visual deficits (reported cataracts), and suspected hearing loss. Pt with impaired attention and memory (immediate, short term, working, functional). Pt relies heavily of wife for iADLs. Pt not interested in pursuing treatment for speech and cognition at this time. However, pt agreeable to pursuing repeat MBSS as he reports a worsening of pharyngeal swallow function. Clinically, pt with s/sx mild oral dysphagia c/b prolonged mastication and A-P transit with solids likely due to orofacial weakness and sensory changes. Pharyngeally, x1 delayed cough with consecutive cup sips of thin liquids. Pt reports increased coughing with POs and intermittent globus sensation with solids. Pt endorsed particular difficulty with milk/dairy and lettuce. Pt would benefit from ongoing SLP services targeting dysphagia following repeat MBSS. Order faxed to PCP for MBSS. Pt and wife agreeable to scheduling further SLP tx pending results of MBSS.  OBJECTIVE IMPAIRMENTS include attention, memory, dysarthria, and dysphagia. These impairments are limiting patient from managing medications, managing appointments, managing finances, household responsibilities, ADLs/IADLs, effectively communicating at home and in community, and safety when swallowing. Factors  affecting potential to achieve goals and functional outcome are ability to learn/carryover information, co-morbidities, cooperation/participation level, and severity of impairments. Patient will benefit from skilled SLP services to address above impairments and improve overall function.  REHAB POTENTIAL: Fair for swallowing given likely chron city  PLAN: SLP FREQUENCY: 1x/week  SLP DURATION: 12 weeks (pending results of MBSS)  PLANNED INTERVENTIONS: Aspiration precaution training, Pharyngeal strengthening exercises, Diet toleration management , SLP instruction and feedback, Compensatory strategies, Patient/family education, and Re-evaluation    Clyde Canterbury, M.S., CCC-SLP Speech-Language Pathologist [place] [facility] [phone] Arnette Felts)  [facility] at [facility] [address] [place], [place], [address] Phone: [phone]   Fax:  [phone]

## 2023-02-17 ENCOUNTER — Ambulatory Visit: Payer: Medicare Other | Admitting: Speech Pathology

## 2023-02-18 DIAGNOSIS — I2510 Atherosclerotic heart disease of native coronary artery without angina pectoris: Secondary | ICD-10-CM | POA: Diagnosis not present

## 2023-02-18 DIAGNOSIS — I10 Essential (primary) hypertension: Secondary | ICD-10-CM | POA: Diagnosis not present

## 2023-02-18 DIAGNOSIS — F450 Somatization disorder: Secondary | ICD-10-CM | POA: Diagnosis not present

## 2023-02-18 DIAGNOSIS — R011 Cardiac murmur, unspecified: Secondary | ICD-10-CM | POA: Diagnosis not present

## 2023-02-18 DIAGNOSIS — E782 Mixed hyperlipidemia: Secondary | ICD-10-CM | POA: Diagnosis not present

## 2023-02-18 DIAGNOSIS — E7800 Pure hypercholesterolemia, unspecified: Secondary | ICD-10-CM | POA: Diagnosis not present

## 2023-02-20 ENCOUNTER — Ambulatory Visit: Payer: Medicare Other | Admitting: Speech Pathology

## 2023-02-21 ENCOUNTER — Ambulatory Visit: Payer: Medicare Other | Admitting: Family Medicine

## 2023-02-24 ENCOUNTER — Encounter: Payer: Medicare Other | Admitting: Speech Pathology

## 2023-02-26 ENCOUNTER — Other Ambulatory Visit: Payer: Self-pay | Admitting: Nurse Practitioner

## 2023-02-27 ENCOUNTER — Encounter: Payer: Medicare Other | Admitting: Speech Pathology

## 2023-02-27 NOTE — Telephone Encounter (Signed)
Requested by interface surescripts. Last doc. Refill 02/24/23 #30 . Requesting too soon. Future visit in 3 weeks .  Requested Prescriptions  Refused Prescriptions Disp Refills   valsartan (DIOVAN) 40 MG tablet [Pharmacy Med Name: VALSARTAN 40 MG TABLET] 30 tablet 1    Sig: TAKE 1 TABLET BY MOUTH EVERY DAY     Cardiovascular:  Angiotensin Receptor Blockers Passed - 02/26/2023  3:34 PM      Passed - Cr in normal range and within 180 days    Creatinine  Date Value Ref Range Status  07/19/2012 1.12 0.60 - 1.30 mg/dL Final   Creatinine, Ser  Date Value Ref Range Status  02/05/2023 1.05 0.76 - 1.27 mg/dL Final         Passed - K in normal range and within 180 days    Potassium  Date Value Ref Range Status  02/05/2023 4.6 3.5 - 5.2 mmol/L Final  07/19/2012 3.6 3.5 - 5.1 mmol/L Final         Passed - Patient is not pregnant      Passed - Last BP in normal range    BP Readings from Last 1 Encounters:  02/05/23 138/64         Passed - Valid encounter within last 6 months    Recent Outpatient Visits           3 weeks ago Aphasia   [facility] [place], Megan P, DO   11 months ago Mixed hyperlipidemia   [facility] [place], Megan P, DO   1 year ago TIA (transient ischemic attack)   [facility] [place], Megan P, DO   1 year ago Neck pain   [facility] [place], [place], DO   1 year ago Neck pain   [facility] [place], Oralia Rud, DO       Future Appointments             In 3 weeks [place], Oralia Rud, DO  [facility]

## 2023-03-05 DIAGNOSIS — H2513 Age-related nuclear cataract, bilateral: Secondary | ICD-10-CM | POA: Diagnosis not present

## 2023-03-05 DIAGNOSIS — H00024 Hordeolum internum left upper eyelid: Secondary | ICD-10-CM | POA: Diagnosis not present

## 2023-03-10 ENCOUNTER — Encounter: Payer: Medicare Other | Admitting: Speech Pathology

## 2023-03-12 ENCOUNTER — Encounter: Payer: Medicare Other | Admitting: Speech Pathology

## 2023-03-17 ENCOUNTER — Encounter: Payer: Medicare Other | Admitting: Speech Pathology

## 2023-03-19 ENCOUNTER — Encounter: Payer: Medicare Other | Admitting: Speech Pathology

## 2023-03-20 ENCOUNTER — Ambulatory Visit (INDEPENDENT_AMBULATORY_CARE_PROVIDER_SITE_OTHER): Payer: Medicare Other | Admitting: Family Medicine

## 2023-03-20 ENCOUNTER — Encounter: Payer: Self-pay | Admitting: Family Medicine

## 2023-03-20 VITALS — BP 132/68 | HR 52 | Ht 66.0 in | Wt 146.2 lb

## 2023-03-20 DIAGNOSIS — F419 Anxiety disorder, unspecified: Secondary | ICD-10-CM

## 2023-03-20 DIAGNOSIS — R1310 Dysphagia, unspecified: Secondary | ICD-10-CM | POA: Diagnosis not present

## 2023-03-20 MED ORDER — AMLODIPINE BESYLATE 2.5 MG PO TABS: 2.5000 mg | ORAL_TABLET | Freq: Every day | ORAL | 0 refills | Status: DC

## 2023-03-20 MED ORDER — VALSARTAN 40 MG PO TABS: 40.0000 mg | ORAL_TABLET | Freq: Every day | ORAL | 0 refills | Status: DC

## 2023-03-20 MED ORDER — ROSUVASTATIN CALCIUM 5 MG PO TABS: 5.0000 mg | ORAL_TABLET | ORAL | 0 refills | Status: DC

## 2023-03-20 NOTE — Assessment & Plan Note (Signed)
Declines speech therapy at this time. Will let us know if he changes his mind and would like to do some. Call with any concerns.

## 2023-03-20 NOTE — Progress Notes (Signed)
BP 132/68   Pulse (!) 52   Ht 5\' 6"  (1.676 m)   Wt 146 lb 3.2 oz (66.3 kg)   SpO2 99%   BMI 23.60 kg/m    Subjective:    Patient ID: Nathan Fields, male    DOB: [birth year], 72 y.o.   MRN: [id number]  HPI: Nathan Fields is a 72 y.o. male  Chief Complaint  Patient presents with   Anxiety    Patient says he has not taking the prescription for Cymbalta prescription.    Speech Problem    Patient says he would like to hold off on the appointments for the Speech Therapy.    ANXIETY/STRESS Duration:chronic Status:stable Anxious mood: yes  Excessive worrying: no Irritability: no  Sweating: no Nausea: no Palpitations:no Hyperventilation: no Panic attacks: no Agoraphobia: no  Obscessions/compulsions: no Depressed mood: no    03/20/2023    2:09 PM 02/05/2023    1:18 PM 12/31/2022    3:03 PM 03/21/2022    4:26 PM 12/05/2021   12:13 PM  Depression screen PHQ 2/9  Decreased Interest 0 3 0 0 1  Down, Depressed, Hopeless 0 1 0 0 1  PHQ - 2 Score 0 4 0 0 2  Altered sleeping 0 2 0 0 1  Tired, decreased energy 1 3 0 2 1  Change in appetite 0 1 0 0 0  Feeling bad or failure about yourself  0 2 0 0 1  Trouble concentrating 1 3 0 1 0  Moving slowly or fidgety/restless 1 3 0 2 0  Suicidal thoughts 0 1 0 0 0  PHQ-9 Score 3 19 0 5 5  Difficult doing work/chores Not difficult at all Very difficult Not difficult at all Somewhat difficult Somewhat difficult   Anhedonia: no Weight changes: no Insomnia: no   Hypersomnia: no Fatigue/loss of energy: yes Feelings of worthlessness: no Feelings of guilt: no Impaired concentration/indecisiveness: no Suicidal ideations: no  Crying spells: no Recent Stressors/Life Changes: no   Relationship problems: no   Family stress: no     Financial stress: no    Job stress: no    Recent death/loss: no  Decided not to do speech therapy. Notes that he is doing OK with his speech and his swallow at this time. Doesn't want to go through all that.  Due to see Dr. Sherryll Burger in about 2 week. Saw Dr. Juliann Pares about a month ago. No other concerns or complaints at this time.   Relevant past medical, surgical, family and social history reviewed and updated as indicated. Interim medical history since our last visit reviewed. Allergies and medications reviewed and updated.  Review of Systems  Constitutional: Negative.   Respiratory: Negative.    Cardiovascular: Negative.   Gastrointestinal: Negative.   Musculoskeletal: Negative.   Neurological: Negative.   Psychiatric/Behavioral: Negative.      Per HPI unless specifically indicated above     Objective:    BP 132/68   Pulse (!) 52   Ht 5\' 6"  (1.676 m)   Wt 146 lb 3.2 oz (66.3 kg)   SpO2 99%   BMI 23.60 kg/m   Wt Readings from Last 3 Encounters:  03/20/23 146 lb 3.2 oz (66.3 kg)  02/05/23 150 lb 3.2 oz (68.1 kg)  12/31/22 152 lb 3.2 oz (69 kg)    Physical Exam Vitals and nursing note reviewed.  Constitutional:      General: He is not in acute distress.    Appearance: Normal appearance. He  is not ill-appearing, toxic-appearing or diaphoretic.  HENT:     Head: Normocephalic and atraumatic.     Right Ear: External ear normal.     Left Ear: External ear normal.     Nose: Nose normal.     Mouth/Throat:     Mouth: Mucous membranes are moist.     Pharynx: Oropharynx is clear.  Eyes:     General: No scleral icterus.       Right eye: No discharge.        Left eye: No discharge.     Extraocular Movements: Extraocular movements intact.     Conjunctiva/sclera: Conjunctivae normal.     Pupils: Pupils are equal, round, and reactive to light.  Cardiovascular:     Rate and Rhythm: Normal rate and regular rhythm.     Pulses: Normal pulses.     Heart sounds: Normal heart sounds. No murmur heard.    No friction rub. No gallop.  Pulmonary:     Effort: Pulmonary effort is normal. No respiratory distress.     Breath sounds: Normal breath sounds. No stridor. No wheezing, rhonchi or rales.   Chest:     Chest wall: No tenderness.  Musculoskeletal:        General: Normal range of motion.     Cervical back: Normal range of motion and neck supple.  Skin:    General: Skin is warm and dry.     Capillary Refill: Capillary refill takes less than 2 seconds.     Coloration: Skin is not jaundiced or pale.     Findings: No bruising, erythema, lesion or rash.  Neurological:     General: No focal deficit present.     Mental Status: He is alert and oriented to person, place, and time. Mental status is at baseline.  Psychiatric:        Mood and Affect: Mood normal.        Behavior: Behavior normal.        Thought Content: Thought content normal.        Judgment: Judgment normal.     Results for orders placed or performed in visit on 02/05/23  Comprehensive metabolic panel  Result Value Ref Range   Glucose 94 70 - 99 mg/dL   BUN 15 8 - 27 mg/dL   Creatinine, Ser 1.61 0.76 - 1.27 mg/dL   eGFR 76 >09 UE/AVW/0.98   BUN/Creatinine Ratio 14 10 - 24   Sodium 143 134 - 144 mmol/L   Potassium 4.6 3.5 - 5.2 mmol/L   Chloride 105 96 - 106 mmol/L   CO2 24 20 - 29 mmol/L   Calcium 9.6 8.6 - 10.2 mg/dL   Total Protein 6.6 6.0 - 8.5 g/dL   Albumin 4.5 3.8 - 4.8 g/dL   Globulin, Total 2.1 1.5 - 4.5 g/dL   Bilirubin Total 0.3 0.0 - 1.2 mg/dL   Alkaline Phosphatase 95 44 - 121 IU/L   AST 15 0 - 40 IU/L   ALT 18 0 - 44 IU/L  CBC with Differential/Platelet  Result Value Ref Range   WBC 6.5 3.4 - 10.8 x10E3/uL   RBC 4.51 4.14 - 5.80 x10E6/uL   Hemoglobin 13.5 13.0 - 17.7 g/dL   Hematocrit 11.9 14.7 - 51.0 %   MCV 89 79 - 97 fL   MCH 29.9 26.6 - 33.0 pg   MCHC 33.5 31.5 - 35.7 g/dL   RDW 82.9 56.2 - 13.0 %   Platelets 220 150 - 450 x10E3/uL  Neutrophils 58 Not Estab. %   Lymphs 31 Not Estab. %   Monocytes 8 Not Estab. %   Eos 2 Not Estab. %   Basos 1 Not Estab. %   Neutrophils Absolute 3.8 1.4 - 7.0 x10E3/uL   Lymphocytes Absolute 2.0 0.7 - 3.1 x10E3/uL   Monocytes Absolute 0.5  0.1 - 0.9 x10E3/uL   EOS (ABSOLUTE) 0.1 0.0 - 0.4 x10E3/uL   Basophils Absolute 0.1 0.0 - 0.2 x10E3/uL   Immature Granulocytes 0 Not Estab. %   Immature Grans (Abs) 0.0 0.0 - 0.1 x10E3/uL  Lipid Panel w/o Chol/HDL Ratio  Result Value Ref Range   Cholesterol, Total 209 (H) 100 - 199 mg/dL   Triglycerides 244 (H) 0 - 149 mg/dL   HDL 49 >01 mg/dL   VLDL Cholesterol Cal 27 5 - 40 mg/dL   LDL Chol Calc (NIH) 027 (H) 0 - 99 mg/dL  PSA  Result Value Ref Range   Prostate Specific Ag, Serum 1.5 0.0 - 4.0 ng/mL  TSH  Result Value Ref Range   TSH 1.750 0.450 - 4.500 uIU/mL  Urinalysis, Routine w reflex microscopic  Result Value Ref Range   Specific Gravity, UA 1.020 1.005 - 1.030   pH, UA 7.0 5.0 - 7.5   Color, UA Yellow Yellow   Appearance Ur Clear Clear   Leukocytes,UA Negative Negative   Protein,UA Negative Negative/Trace   Glucose, UA Negative Negative   Ketones, UA Negative Negative   RBC, UA Negative Negative   Bilirubin, UA Negative Negative   Urobilinogen, Ur 0.2 0.2 - 1.0 mg/dL   Nitrite, UA Negative Negative   Microscopic Examination Comment   Microalbumin, Urine Waived  Result Value Ref Range   Microalb, Ur Waived 10 0 - 19 mg/L   Creatinine, Urine Waived 200 10 - 300 mg/dL   Microalb/Creat Ratio <30 <30 mg/g  Bayer DCA Hb A1c Waived  Result Value Ref Range   HB A1C (BAYER DCA - WAIVED) 6.5 (H) 4.8 - 5.6 %  B12  Result Value Ref Range   Vitamin B-12 572 232 - 1,245 pg/mL      Assessment & Plan:   Problem List Items Addressed This Visit       Digestive   Swallowing difficulty    Declines speech therapy at this time. Will let us know if he changes his mind and would like to do some. Call with any concerns.         Other   Acute anxiety - Primary    Did not start his cymbalta. Generally feeling well. No other concerns or complaints at this time.         Follow up plan: Return in about 6 weeks (around 05/01/2023).

## 2023-03-20 NOTE — Assessment & Plan Note (Signed)
Did not start his cymbalta. Generally feeling well. No other concerns or complaints at this time.

## 2023-03-24 ENCOUNTER — Encounter: Payer: Medicare Other | Admitting: Speech Pathology

## 2023-03-26 ENCOUNTER — Encounter: Payer: Medicare Other | Admitting: Speech Pathology

## 2023-03-31 ENCOUNTER — Encounter: Payer: Medicare Other | Admitting: Speech Pathology

## 2023-04-02 ENCOUNTER — Encounter: Payer: Medicare Other | Admitting: Speech Pathology

## 2023-04-07 ENCOUNTER — Encounter: Payer: Medicare Other | Admitting: Speech Pathology

## 2023-04-07 DIAGNOSIS — R200 Anesthesia of skin: Secondary | ICD-10-CM | POA: Diagnosis not present

## 2023-04-07 DIAGNOSIS — G479 Sleep disorder, unspecified: Secondary | ICD-10-CM | POA: Diagnosis not present

## 2023-04-09 ENCOUNTER — Encounter: Payer: Medicare Other | Admitting: Speech Pathology

## 2023-04-14 ENCOUNTER — Encounter: Payer: Medicare Other | Admitting: Speech Pathology

## 2023-04-16 ENCOUNTER — Encounter: Payer: Medicare Other | Admitting: Speech Pathology

## 2023-04-21 ENCOUNTER — Encounter: Payer: Medicare Other | Admitting: Speech Pathology

## 2023-04-23 ENCOUNTER — Encounter: Payer: Medicare Other | Admitting: Speech Pathology

## 2023-04-28 ENCOUNTER — Encounter: Payer: Medicare Other | Admitting: Speech Pathology

## 2023-04-30 ENCOUNTER — Ambulatory Visit: Payer: Medicare Other | Admitting: Family Medicine

## 2023-04-30 ENCOUNTER — Encounter: Payer: Medicare Other | Admitting: Speech Pathology

## 2023-04-30 ENCOUNTER — Encounter: Payer: Self-pay | Admitting: Family Medicine

## 2023-04-30 VITALS — BP 136/77 | HR 64 | Ht 66.0 in | Wt 142.8 lb

## 2023-04-30 DIAGNOSIS — E782 Mixed hyperlipidemia: Secondary | ICD-10-CM

## 2023-04-30 DIAGNOSIS — G459 Transient cerebral ischemic attack, unspecified: Secondary | ICD-10-CM

## 2023-04-30 DIAGNOSIS — E118 Type 2 diabetes mellitus with unspecified complications: Secondary | ICD-10-CM | POA: Diagnosis not present

## 2023-04-30 DIAGNOSIS — E119 Type 2 diabetes mellitus without complications: Secondary | ICD-10-CM

## 2023-04-30 DIAGNOSIS — R7301 Impaired fasting glucose: Secondary | ICD-10-CM

## 2023-04-30 DIAGNOSIS — I2510 Atherosclerotic heart disease of native coronary artery without angina pectoris: Secondary | ICD-10-CM

## 2023-04-30 DIAGNOSIS — I10 Essential (primary) hypertension: Secondary | ICD-10-CM

## 2023-04-30 LAB — BAYER DCA HB A1C WAIVED: HB A1C (BAYER DCA - WAIVED): 6.2 % — ABNORMAL HIGH (ref 4.8–5.6)

## 2023-04-30 LAB — HM DIABETES EYE EXAM

## 2023-04-30 NOTE — Assessment & Plan Note (Addendum)
Doing great with A1c of 6.2. Continue diet and exercise. Call with any concerns. Patient receiving retinal screening today.

## 2023-04-30 NOTE — Progress Notes (Signed)
BP 136/77   Pulse 64   Ht 5\' 6"  (1.676 m)   Wt 142 lb 12.8 oz (64.8 kg)   SpO2 98%   BMI 23.05 kg/m    Subjective:    Patient ID: Nathan Fields, male    DOB: 1950/09/14, 72 y.o.   MRN: [id number]  HPI: Nathan Fields is a 72 y.o. male  Chief Complaint  Patient presents with   Diabetes   DIABETES Hypoglycemic episodes:no Polydipsia/polyuria: no Visual disturbance: no Chest pain: no Paresthesias: no Glucose Monitoring: no  Accucheck frequency: Not Checking Taking Insulin?: no Blood Pressure Monitoring: not checking Retinal Examination: Not up to Date Foot Exam: Up to Date Diabetic Education: Completed Pneumovax: Not Up to Date Influenza: Not Up to Date Aspirin: yes   Relevant past medical, surgical, family and social history reviewed and updated as indicated. Interim medical history since our last visit reviewed. Allergies and medications reviewed and updated.  Review of Systems  Constitutional: Negative.   Respiratory: Negative.    Cardiovascular: Negative.   Musculoskeletal: Negative.   Neurological: Negative.   Psychiatric/Behavioral: Negative.      Per HPI unless specifically indicated above     Objective:    BP 136/77   Pulse 64   Ht 5\' 6"  (1.676 m)   Wt 142 lb 12.8 oz (64.8 kg)   SpO2 98%   BMI 23.05 kg/m   Wt Readings from Last 3 Encounters:  04/30/23 142 lb 12.8 oz (64.8 kg)  03/20/23 146 lb 3.2 oz (66.3 kg)  02/05/23 150 lb 3.2 oz (68.1 kg)    Physical Exam Vitals and nursing note reviewed.  Constitutional:      General: He is not in acute distress.    Appearance: Normal appearance. He is normal weight. He is not ill-appearing, toxic-appearing or diaphoretic.  HENT:     Head: Normocephalic and atraumatic.     Right Ear: External ear normal.     Left Ear: External ear normal.     Nose: Nose normal.     Mouth/Throat:     Mouth: Mucous membranes are moist.     Pharynx: Oropharynx is clear.  Eyes:     General: No scleral  icterus.       Right eye: No discharge.        Left eye: No discharge.     Extraocular Movements: Extraocular movements intact.     Conjunctiva/sclera: Conjunctivae normal.     Pupils: Pupils are equal, round, and reactive to light.  Cardiovascular:     Rate and Rhythm: Normal rate and regular rhythm.     Pulses: Normal pulses.     Heart sounds: Normal heart sounds. No murmur heard.    No friction rub. No gallop.  Pulmonary:     Effort: Pulmonary effort is normal. No respiratory distress.     Breath sounds: Normal breath sounds. No stridor. No wheezing, rhonchi or rales.  Chest:     Chest wall: No tenderness.  Musculoskeletal:        General: Normal range of motion.     Cervical back: Normal range of motion and neck supple.  Skin:    General: Skin is warm and dry.     Capillary Refill: Capillary refill takes less than 2 seconds.     Coloration: Skin is not jaundiced or pale.     Findings: No bruising, erythema, lesion or rash.  Neurological:     General: No focal deficit present.  Mental Status: He is alert and oriented to person, place, and time. Mental status is at baseline.  Psychiatric:        Mood and Affect: Mood normal.        Behavior: Behavior normal.        Thought Content: Thought content normal.        Judgment: Judgment normal.     Results for orders placed or performed in visit on 02/05/23  Comprehensive metabolic panel  Result Value Ref Range   Glucose 94 70 - 99 mg/dL   BUN 15 8 - 27 mg/dL   Creatinine, Ser 9.52 0.76 - 1.27 mg/dL   eGFR 76 >84 XL/KGM/0.10   BUN/Creatinine Ratio 14 10 - 24   Sodium 143 134 - 144 mmol/L   Potassium 4.6 3.5 - 5.2 mmol/L   Chloride 105 96 - 106 mmol/L   CO2 24 20 - 29 mmol/L   Calcium 9.6 8.6 - 10.2 mg/dL   Total Protein 6.6 6.0 - 8.5 g/dL   Albumin 4.5 3.8 - 4.8 g/dL   Globulin, Total 2.1 1.5 - 4.5 g/dL   Bilirubin Total 0.3 0.0 - 1.2 mg/dL   Alkaline Phosphatase 95 44 - 121 IU/L   AST 15 0 - 40 IU/L   ALT 18 0 -  44 IU/L  CBC with Differential/Platelet  Result Value Ref Range   WBC 6.5 3.4 - 10.8 x10E3/uL   RBC 4.51 4.14 - 5.80 x10E6/uL   Hemoglobin 13.5 13.0 - 17.7 g/dL   Hematocrit 27.2 53.6 - 51.0 %   MCV 89 79 - 97 fL   MCH 29.9 26.6 - 33.0 pg   MCHC 33.5 31.5 - 35.7 g/dL   RDW 64.4 03.4 - 74.2 %   Platelets 220 150 - 450 x10E3/uL   Neutrophils 58 Not Estab. %   Lymphs 31 Not Estab. %   Monocytes 8 Not Estab. %   Eos 2 Not Estab. %   Basos 1 Not Estab. %   Neutrophils Absolute 3.8 1.4 - 7.0 x10E3/uL   Lymphocytes Absolute 2.0 0.7 - 3.1 x10E3/uL   Monocytes Absolute 0.5 0.1 - 0.9 x10E3/uL   EOS (ABSOLUTE) 0.1 0.0 - 0.4 x10E3/uL   Basophils Absolute 0.1 0.0 - 0.2 x10E3/uL   Immature Granulocytes 0 Not Estab. %   Immature Grans (Abs) 0.0 0.0 - 0.1 x10E3/uL  Lipid Panel w/o Chol/HDL Ratio  Result Value Ref Range   Cholesterol, Total 209 (H) 100 - 199 mg/dL   Triglycerides 595 (H) 0 - 149 mg/dL   HDL 49 >63 mg/dL   VLDL Cholesterol Cal 27 5 - 40 mg/dL   LDL Chol Calc (NIH) 875 (H) 0 - 99 mg/dL  PSA  Result Value Ref Range   Prostate Specific Ag, Serum 1.5 0.0 - 4.0 ng/mL  TSH  Result Value Ref Range   TSH 1.750 0.450 - 4.500 uIU/mL  Urinalysis, Routine w reflex microscopic  Result Value Ref Range   Specific Gravity, UA 1.020 1.005 - 1.030   pH, UA 7.0 5.0 - 7.5   Color, UA Yellow Yellow   Appearance Ur Clear Clear   Leukocytes,UA Negative Negative   Protein,UA Negative Negative/Trace   Glucose, UA Negative Negative   Ketones, UA Negative Negative   RBC, UA Negative Negative   Bilirubin, UA Negative Negative   Urobilinogen, Ur 0.2 0.2 - 1.0 mg/dL   Nitrite, UA Negative Negative   Microscopic Examination Comment   Microalbumin, Urine Waived  Result Value Ref Range  Microalb, Ur Waived 10 0 - 19 mg/L   Creatinine, Urine Waived 200 10 - 300 mg/dL   Microalb/Creat Ratio <30 <30 mg/g  Bayer DCA Hb A1c Waived  Result Value Ref Range   HB A1C (BAYER DCA - WAIVED) 6.5 (H) 4.8  - 5.6 %  B12  Result Value Ref Range   Vitamin B-12 572 232 - 1,245 pg/mL      Assessment & Plan:   Problem List Items Addressed This Visit       Endocrine   Controlled diabetes mellitus type 2 with complications (HCC) - Primary    Doing great with A1c of 6.2. Continue diet and exercise. Call with any concerns.       Relevant Orders   Bayer DCA Hb A1c Waived     Follow up plan: Return in about 3 months (around 07/31/2023).

## 2023-05-01 ENCOUNTER — Ambulatory Visit: Payer: Medicare Other | Admitting: Family Medicine

## 2023-05-05 ENCOUNTER — Encounter: Payer: Medicare Other | Admitting: Speech Pathology

## 2023-05-07 ENCOUNTER — Encounter: Payer: Medicare Other | Admitting: Speech Pathology

## 2023-05-23 ENCOUNTER — Other Ambulatory Visit: Payer: Self-pay | Admitting: Family Medicine

## 2023-05-23 NOTE — Telephone Encounter (Signed)
Requested Prescriptions  Pending Prescriptions Disp Refills   rosuvastatin (CRESTOR) 5 MG tablet [Pharmacy Med Name: ROSUVASTATIN CALCIUM 5 MG TAB] 45 tablet 1    Sig: TAKE 1 TABLET BY MOUTH EVERY OTHER DAY     Cardiovascular:  Antilipid - Statins 2 Failed - 05/23/2023  1:30 AM      Failed - Lipid Panel in normal range within the last 12 months    Cholesterol, Total  Date Value Ref Range Status  02/05/2023 209 (H) 100 - 199 mg/dL Final   Cholesterol Piccolo, Waived  Date Value Ref Range Status  09/23/2016 280 (H) <200 mg/dL Final    Comment:                            Desirable                <200                         Borderline High      200- 239                         High                     >239    LDL Chol Calc (NIH)  Date Value Ref Range Status  02/05/2023 133 (H) 0 - 99 mg/dL Final   HDL  Date Value Ref Range Status  02/05/2023 49 >39 mg/dL Final   Triglycerides  Date Value Ref Range Status  02/05/2023 151 (H) 0 - 149 mg/dL Final   Triglycerides Piccolo,Waived  Date Value Ref Range Status  09/23/2016 117 <150 mg/dL Final    Comment:                            Normal                   <150                         Borderline High     150 - 199                         High                200 - 499                         Very High                >499          Passed - Cr in normal range and within 360 days    Creatinine  Date Value Ref Range Status  07/19/2012 1.12 0.60 - 1.30 mg/dL Final   Creatinine, Ser  Date Value Ref Range Status  02/05/2023 1.05 0.76 - 1.27 mg/dL Final         Passed - Patient is not pregnant      Passed - Valid encounter within last 12 months    Recent Outpatient Visits           3 weeks ago Controlled type 2 diabetes mellitus with complication, without long-term current use of insulin [facility])   [place] [facility] [place], [place],  DO   2 months ago Acute anxiety   [facility]  [place], [place], DO   3 months ago Aphasia   [place] [facility] [place], [place], DO   1 year ago Mixed hyperlipidemia   [place] [facility] [place], [place], DO   1 year ago TIA (transient ischemic attack)   [place] [facility] [place], DO       Future Appointments             In 1 week Laural Benes, Oralia Rud, DO  [facility], [facility]

## 2023-05-27 ENCOUNTER — Telehealth: Payer: Self-pay | Admitting: Family Medicine

## 2023-05-27 NOTE — Telephone Encounter (Signed)
Please let him know that his R eye was unreadable on the eye exam. L eye was no retinopathy but because of R eye not being readable he needs to see the eye doctor.

## 2023-05-28 NOTE — Telephone Encounter (Signed)
Left message for patient to give our office a call back to discuss Dr Henriette Combs recommendations.   OK for [facility] to give note if patient calls back.

## 2023-06-02 ENCOUNTER — Ambulatory Visit (INDEPENDENT_AMBULATORY_CARE_PROVIDER_SITE_OTHER): Payer: Medicare Other | Admitting: Family Medicine

## 2023-06-02 ENCOUNTER — Encounter: Payer: Self-pay | Admitting: Family Medicine

## 2023-06-02 VITALS — BP 160/80 | HR 57 | Ht 66.0 in | Wt 149.2 lb

## 2023-06-02 DIAGNOSIS — M79672 Pain in left foot: Secondary | ICD-10-CM

## 2023-06-02 DIAGNOSIS — I10 Essential (primary) hypertension: Secondary | ICD-10-CM

## 2023-06-02 NOTE — Assessment & Plan Note (Signed)
Skipped his medicine the last 2 nights. Advised him to take his medicine daily. Call with any concerns.

## 2023-06-02 NOTE — Progress Notes (Signed)
BP (!) 160/80   Pulse (!) 57   Ht 5\' 6"  (1.676 m)   Wt 149 lb 3.2 oz (67.7 kg)   SpO2 98%   BMI 24.08 kg/m    Subjective:    Patient ID: Nathan Fields, male    DOB: Oct 11, 1950, 72 y.o.   MRN: [id number]  HPI: Nathan Fields is a 72 y.o. male  Chief Complaint  Patient presents with   Arthritis    Patient says for the past 3-4 months, he wakes up and experiences heel pain in his L foot. Patient says he he has arthritis in his foot and thinks he may need to discuss an injection in his foot. Patient says in the mornings, it is unbearable and radiates up his leg, but then as the day goes on it gets better.    FOOT PAIN Duration: 3-4 months Involved foot: L foot Mechanism of injury: unknown Location: back of his L heel Onset: sudden  Severity: severe  Quality:  sharp  Frequency: intermittent Radiation: up his back of his leg Aggravating factors: first step in the AM  Alleviating factors: walking in shoes Status: worse Treatments attempted: rest, ice, heat, APAP, and ibuprofen  Relief with NSAIDs?:  mild Weakness with weight bearing or walking: no Morning stiffness: no Swelling: no Redness: no Bruising: no Paresthesias / decreased sensation: no  Fevers:no  Relevant past medical, surgical, family and social history reviewed and updated as indicated. Interim medical history since our last visit reviewed. Allergies and medications reviewed and updated.  Review of Systems  Constitutional: Negative.   Respiratory: Negative.    Cardiovascular: Negative.   Musculoskeletal:  Positive for myalgias. Negative for arthralgias, back pain, gait problem, joint swelling, neck pain and neck stiffness.  Psychiatric/Behavioral: Negative.      Per HPI unless specifically indicated above     Objective:    BP (!) 160/80   Pulse (!) 57   Ht 5\' 6"  (1.676 m)   Wt 149 lb 3.2 oz (67.7 kg)   SpO2 98%   BMI 24.08 kg/m   Wt Readings from Last 3 Encounters:  06/02/23 149 lb 3.2 oz  (67.7 kg)  04/30/23 142 lb 12.8 oz (64.8 kg)  03/20/23 146 lb 3.2 oz (66.3 kg)    Physical Exam Vitals and nursing note reviewed.  Constitutional:      General: He is not in acute distress.    Appearance: Normal appearance. He is not ill-appearing, toxic-appearing or diaphoretic.  HENT:     Head: Normocephalic and atraumatic.     Right Ear: External ear normal.     Left Ear: External ear normal.     Nose: Nose normal.     Mouth/Throat:     Mouth: Mucous membranes are moist.     Pharynx: Oropharynx is clear.  Eyes:     General: No scleral icterus.       Right eye: No discharge.        Left eye: No discharge.     Extraocular Movements: Extraocular movements intact.     Conjunctiva/sclera: Conjunctivae normal.     Pupils: Pupils are equal, round, and reactive to light.  Cardiovascular:     Rate and Rhythm: Normal rate and regular rhythm.     Pulses: Normal pulses.     Heart sounds: Normal heart sounds. No murmur heard.    No friction rub. No gallop.  Pulmonary:     Effort: Pulmonary effort is normal. No respiratory distress.  Breath sounds: Normal breath sounds. No stridor. No wheezing, rhonchi or rales.  Chest:     Chest wall: No tenderness.  Musculoskeletal:        General: Normal range of motion.     Cervical back: Normal range of motion and neck supple.  Skin:    General: Skin is warm and dry.     Capillary Refill: Capillary refill takes less than 2 seconds.     Coloration: Skin is not jaundiced or pale.     Findings: No bruising, erythema, lesion or rash.  Neurological:     General: No focal deficit present.     Mental Status: He is alert and oriented to person, place, and time. Mental status is at baseline.  Psychiatric:        Mood and Affect: Mood normal.        Behavior: Behavior normal.        Thought Content: Thought content normal.        Judgment: Judgment normal.     Results for orders placed or performed in visit on 04/30/23  Bayer DCA Hb A1c  Waived  Result Value Ref Range   HB A1C (BAYER DCA - WAIVED) 6.2 (H) 4.8 - 5.6 %      Assessment & Plan:   Problem List Items Addressed This Visit       Cardiovascular and Mediastinum   Hypertension    Skipped his medicine the last 2 nights. Advised him to take his medicine daily. Call with any concerns.       Other Visit Diagnoses     Pain of left heel    -  Primary   Will get him back into podiatry. Had shot previously. Call with any concerns or if not getting better.   Relevant Orders   Ambulatory referral to Podiatry        Follow up plan: Return in about 2 months (around 08/03/2023).

## 2023-06-02 NOTE — Telephone Encounter (Signed)
Reviewed information from Dr. Laural Benes. Patient is aware of the recommendation to schedule with an eye doctor. No further needs or concerns at this time.

## 2023-06-06 NOTE — Telephone Encounter (Signed)
This eye exam was performed by the practice, not [facility]

## 2023-06-10 ENCOUNTER — Encounter: Payer: Self-pay | Admitting: Family Medicine

## 2023-06-25 ENCOUNTER — Other Ambulatory Visit: Payer: Self-pay | Admitting: Nurse Practitioner

## 2023-06-26 NOTE — Telephone Encounter (Signed)
Requested Prescriptions  Pending Prescriptions Disp Refills   valsartan (DIOVAN) 40 MG tablet [Pharmacy Med Name: VALSARTAN 40 MG TABLET] 90 tablet 0    Sig: TAKE 1 TABLET BY MOUTH EVERY DAY     Cardiovascular:  Angiotensin Receptor Blockers Failed - 06/26/2023  2:47 PM      Failed - Last BP in normal range    BP Readings from Last 1 Encounters:  06/02/23 (!) 160/80         Passed - Cr in normal range and within 180 days    Creatinine  Date Value Ref Range Status  07/19/2012 1.12 0.60 - 1.30 mg/dL Final   Creatinine, Ser  Date Value Ref Range Status  02/05/2023 1.05 0.76 - 1.27 mg/dL Final         Passed - K in normal range and within 180 days    Potassium  Date Value Ref Range Status  02/05/2023 4.6 3.5 - 5.2 mmol/L Final  07/19/2012 3.6 3.5 - 5.1 mmol/L Final         Passed - Patient is not pregnant      Passed - Valid encounter within last 6 months    Recent Outpatient Visits           3 weeks ago Pain of left heel   [place] [facility] [place], Megan P, DO   1 month ago Controlled type 2 diabetes mellitus with complication, without long-term current use of insulin (HCC)   [facility] [facility] [place], Megan P, DO   3 months ago Acute anxiety   [place] [facility] [place], [place], DO   4 months ago Aphasia   [place] [facility] [place], [place], DO   1 year ago Mixed hyperlipidemia   [place] [facility] [place], Oralia Rud, DO       Future Appointments             In 1 month Johnson, Oralia Rud, DO [place] [facility], [facility]

## 2023-07-08 ENCOUNTER — Encounter: Payer: Self-pay | Admitting: Podiatry

## 2023-07-08 ENCOUNTER — Ambulatory Visit (INDEPENDENT_AMBULATORY_CARE_PROVIDER_SITE_OTHER): Payer: Medicare Other | Admitting: Podiatry

## 2023-07-08 ENCOUNTER — Ambulatory Visit (INDEPENDENT_AMBULATORY_CARE_PROVIDER_SITE_OTHER): Payer: Medicare Other

## 2023-07-08 DIAGNOSIS — M7662 Achilles tendinitis, left leg: Secondary | ICD-10-CM

## 2023-07-08 DIAGNOSIS — M722 Plantar fascial fibromatosis: Secondary | ICD-10-CM

## 2023-07-08 MED ORDER — MELOXICAM 15 MG PO TABS: 15.0000 mg | ORAL_TABLET | Freq: Every day | ORAL | 1 refills | Status: DC

## 2023-07-08 NOTE — Progress Notes (Signed)
 Chief Complaint  Patient presents with   Foot Pain    I had heel pain a couple of weeks ago. N - heel pain L - posterior heel left D - 2 years off and on O - suddenly, intermitten C - sharp pain, tooth ache feeling A - get up in the morning, certain shoes T - changed shoes    HPI: 73 y.o. male presenting today as a new patient for evaluation of pain and tenderness associated to the left posterior heel as well as the right great toe joint.  The left posterior heel has been painful for about 2 years intermittently.  He has received cortisone injections in the past which have been severely painful and they did not seem to alleviate his symptoms for a long Patient states that he does have a history of injuring his right great toe about 20 years ago.  Since that time it has been stiff and tender.  They have diagnosed him in the past with arthritis of the toe joint  Past Medical History:  Diagnosis Date   Anxiety    Aortic atherosclerosis (HCC)    Arthritis of right foot    B12 deficiency    Bilateral carotid artery stenosis 03/21/2021   a.) Carotid doppler 03/21/2021: 50-60% pRICA, 70-99% pLICA   CAD (coronary artery disease) 09/09/2007   a.) LHC 09/09/2007: EF 60%; 30% dLM, 25% pLAD, 25% mLAD, 40% oLCx, 50% pLCx, 30% pRI, 100% pRCA with good collateral flow; no interventions opting for med mgmt.   Chronic low back pain    Depression    Diastolic dysfunction 08/16/2021   a.)  TTE 08/16/2021: EF >55%; mild AR/MR/TR, trivial PR; G1DD.   GERD (gastroesophageal reflux disease)    History of kidney stones    Hyperlipidemia    Hypertension    Murmur    Plantar fasciitis    Pneumonia    Right thyroid  nodule 06/15/2021   a.) CTA neck 06/15/2021: measured 7mm.   Syncope    T2DM (type 2 diabetes mellitus) (HCC)    TIA (transient ischemic attack)     Past Surgical History:  Procedure Laterality Date   CARDIAC CATHETERIZATION     COLONOSCOPY  2011   Polyp removed, follow up in 5  years   COLONOSCOPY WITH PROPOFOL  N/A 01/14/2017   Procedure: COLONOSCOPY WITH PROPOFOL ;  Surgeon: Therisa Bi, MD;  Location: [facility] ENDOSCOPY;  Service: Endoscopy;  Laterality: N/A;   COLONOSCOPY WITH PROPOFOL  N/A 09/17/2022   Procedure: COLONOSCOPY WITH PROPOFOL ;  Surgeon: Therisa Bi, MD;  Location: [facility] ENDOSCOPY;  Service: Gastroenterology;  Laterality: N/A;   ENDARTERECTOMY Left 09/06/2021   Procedure: ENDARTERECTOMY CAROTID;  Surgeon: Marea Selinda RAMAN, MD;  Location: [facility] ORS;  Service: Vascular;  Laterality: Left;   ESOPHAGOGASTRODUODENOSCOPY (EGD) WITH PROPOFOL  N/A 09/17/2022   Procedure: ESOPHAGOGASTRODUODENOSCOPY (EGD) WITH PROPOFOL ;  Surgeon: Therisa Bi, MD;  Location: [facility] ENDOSCOPY;  Service: Gastroenterology;  Laterality: N/A;    Allergies  Allergen Reactions   Statins Other (See Comments)    fatigue fatigue   Zetia  [Ezetimibe ] Other (See Comments)    Constipation   Niacin  Rash   Niacin  And Related Rash     Physical Exam: General: The patient is alert and oriented x3 in no acute distress.  Dermatology: Skin is warm, dry and supple bilateral lower extremities. Negative for open lesions or macerations.  Vascular: Palpable pedal pulses bilaterally. No edema or erythema noted. Capillary refill within normal limits.  Neurological: Epicritic and protective threshold grossly intact  bilaterally.   Musculoskeletal Exam: Pain on palpation noted to the posterior tubercle of the left calcaneus at the insertion of the Achilles tendon consistent with retrocalcaneal bursitis.  There is tenderness with palpation with limited range of motion as well as some slight crepitus to the right great toe joint consistent with DJD  Radiographic Exam LT foot 07/08/2023:  Posterior calcaneal spur noted to the respective calcaneus on lateral view. No fracture or dislocation noted. Normal osseous mineralization noted.     Assessment: 1. Insertional Achilles tendinitis left 2.  Posterior and plantar heel  spur left 3.  Hallux limitus with DJD right first MTP x 20 years  Plan of Care:  -Patient was evaluated. Radiographs were reviewed today. -Ultimately the patient declined cortisone injection.  He says that he had an injection in the past that was severely painful. -Currently the patient is not taking any anti-inflammatory.   -Prescription for meloxicam  15 mg daily as needed -Recommend good supportive shoes that do not irritate the posterior heel and provide stability to the feet -Return to clinic as needed   Thresa EMERSON Sar, DPM [facility]  Dr. Thresa EMERSON Sar, DPM    [address] [place], [place] [address]                Office [phone]  Fax [phone]

## 2023-07-24 DIAGNOSIS — H2513 Age-related nuclear cataract, bilateral: Secondary | ICD-10-CM | POA: Diagnosis not present

## 2023-07-24 DIAGNOSIS — Z0100 Encounter for examination of eyes and vision without abnormal findings: Secondary | ICD-10-CM | POA: Diagnosis not present

## 2023-07-24 DIAGNOSIS — H04123 Dry eye syndrome of bilateral lacrimal glands: Secondary | ICD-10-CM | POA: Diagnosis not present

## 2023-07-24 LAB — HM DIABETES EYE EXAM

## 2023-07-27 ENCOUNTER — Other Ambulatory Visit: Payer: Self-pay | Admitting: Family Medicine

## 2023-07-29 NOTE — Telephone Encounter (Signed)
Requested Prescriptions  Pending Prescriptions Disp Refills   amLODipine (NORVASC) 2.5 MG tablet [Pharmacy Med Name: AMLODIPINE BESYLATE 2.5 MG TAB] 90 tablet 0    Sig: TAKE 1 TABLET BY MOUTH EVERY DAY     Cardiovascular: Calcium Channel Blockers 2 Failed - 07/29/2023  9:14 AM      Failed - Last BP in normal range    BP Readings from Last 1 Encounters:  06/02/23 (!) 160/80         Passed - Last Heart Rate in normal range    Pulse Readings from Last 1 Encounters:  06/02/23 (!) 57         Passed - Valid encounter within last 6 months    Recent Outpatient Visits           1 month ago Pain of left heel   [place] [facility] [place], Megan P, DO   3 months ago Controlled type 2 diabetes mellitus with complication, without long-term current use of insulin (HCC)   [place] [facility] [place], Megan P, DO   4 months ago Acute anxiety   [place] [facility] [place], [place], DO   5 months ago Aphasia   [place] [facility] [place], [place], DO   1 year ago Mixed hyperlipidemia   [place] [facility] [place], Oralia Rud, DO       Future Appointments             In 6 days Dorcas Carrow, DO [place] [facility]

## 2023-08-04 ENCOUNTER — Ambulatory Visit (INDEPENDENT_AMBULATORY_CARE_PROVIDER_SITE_OTHER): Payer: Medicare Other | Admitting: Family Medicine

## 2023-08-04 ENCOUNTER — Telehealth: Payer: Self-pay

## 2023-08-04 ENCOUNTER — Encounter: Payer: Self-pay | Admitting: Family Medicine

## 2023-08-04 VITALS — BP 180/80 | HR 62 | Ht 66.0 in | Wt 149.8 lb

## 2023-08-04 DIAGNOSIS — I2510 Atherosclerotic heart disease of native coronary artery without angina pectoris: Secondary | ICD-10-CM | POA: Diagnosis not present

## 2023-08-04 DIAGNOSIS — E782 Mixed hyperlipidemia: Secondary | ICD-10-CM

## 2023-08-04 DIAGNOSIS — E538 Deficiency of other specified B group vitamins: Secondary | ICD-10-CM | POA: Diagnosis not present

## 2023-08-04 DIAGNOSIS — I10 Essential (primary) hypertension: Secondary | ICD-10-CM

## 2023-08-04 DIAGNOSIS — E118 Type 2 diabetes mellitus with unspecified complications: Secondary | ICD-10-CM

## 2023-08-04 DIAGNOSIS — F332 Major depressive disorder, recurrent severe without psychotic features: Secondary | ICD-10-CM | POA: Diagnosis not present

## 2023-08-04 LAB — BAYER DCA HB A1C WAIVED: HB A1C (BAYER DCA - WAIVED): 6.1 % — ABNORMAL HIGH (ref 4.8–5.6)

## 2023-08-04 MED ORDER — AMLODIPINE BESYLATE 5 MG PO TABS: 2.5000 mg | ORAL_TABLET | Freq: Every day | ORAL | 0 refills | Status: DC

## 2023-08-04 NOTE — Progress Notes (Unsigned)
BP (!) 161/69 (BP Location: Left Arm, Cuff Size: Normal)   Pulse 62   Ht 5\' 6"  (1.676 m)   Wt 149 lb 12.8 oz (67.9 kg)   SpO2 98%   BMI 24.18 kg/m    Subjective:    Patient ID: Nathan Fields, male    DOB: [birth year], 73 y.o.   MRN: [id number]  HPI: Nathan Fields is a 73 y.o. male  Chief Complaint  Patient presents with   Hypertension   Hyperlipidemia   Diabetes   DIABETES Hypoglycemic episodes:{Blank single:19197::"yes","no"} Polydipsia/polyuria: {Blank single:19197::"yes","no"} Visual disturbance: {Blank single:19197::"yes","no"} Chest pain: {Blank single:19197::"yes","no"} Paresthesias: {Blank single:19197::"yes","no"} Glucose Monitoring: {Blank single:19197::"yes","no"}  Accucheck frequency: {Blank single:19197::"Not Checking","Daily","BID","TID"} Taking Insulin?: {Blank single:19197::"yes","no"} Blood Pressure Monitoring: {Blank single:19197::"not checking","rarely","daily","weekly","monthly","a few times a day","a few times a week","a few times a month"} Retinal Examination: {Blank single:19197::"Up to Date","Not up to Date"} Foot Exam: {Blank single:19197::"Up to Date","Not up to Date"} Diabetic Education: {Blank single:19197::"Completed","Not Completed"} Pneumovax: {Blank single:19197::"Up to Date","Not up to Date","unknown"} Influenza: {Blank single:19197::"Up to Date","Not up to Date","unknown"} Aspirin: {Blank single:19197::"yes","no"}  HYPERTENSION / HYPERLIPIDEMIA- stopped his crestor about 2 days ago due to joint pain Satisfied with current treatment? {Blank single:19197::"yes","no"} Duration of hypertension: {Blank single:19197::"chronic","months","years"} BP monitoring frequency: {Blank single:19197::"not checking","rarely","daily","weekly","monthly","a few times a day","a few times a week","a few times a month"} BP range:  BP medication side effects: {Blank single:19197::"yes","no"} Past BP meds: {Blank  multiple:19196::"none","amlodipine","amlodipine/benazepril","atenolol","benazepril","benazepril/HCTZ","bisoprolol (bystolic)","carvedilol","chlorthalidone","clonidine","diltiazem","exforge HCT","HCTZ","irbesartan (avapro)","labetalol","lisinopril","lisinopril-HCTZ","losartan (cozaar)","methyldopa","nifedipine","olmesartan (benicar)","olmesartan-HCTZ","quinapril","ramipril","spironalactone","tekturna","valsartan","valsartan-HCTZ","verapamil"} Duration of hyperlipidemia: {Blank single:19197::"chronic","months","years"} Cholesterol medication side effects: {Blank single:19197::"yes","no"} Cholesterol supplements: {Blank multiple:19196::"none","fish oil","niacin","red yeast rice"} Past cholesterol medications: {Blank multiple:19196::"none","atorvastain (lipitor)","lovastatin (mevacor)","pravastatin (pravachol)","rosuvastatin (crestor)","simvastatin (zocor)","vytorin","fenofibrate (tricor)","gemfibrozil","ezetimide (zetia)","niaspan","lovaza"} Medication compliance: {Blank single:19197::"excellent compliance","good compliance","fair compliance","poor compliance"} Aspirin: {Blank single:19197::"yes","no"} Recent stressors: {Blank single:19197::"yes","no"} Recurrent headaches: {Blank single:19197::"yes","no"} Visual changes: {Blank single:19197::"yes","no"} Palpitations: {Blank single:19197::"yes","no"} Dyspnea: {Blank single:19197::"yes","no"} Chest pain: {Blank single:19197::"yes","no"} Lower extremity edema: {Blank single:19197::"yes","no"} Dizzy/lightheaded: {Blank single:19197::"yes","no"}  Relevant past medical, surgical, family and social history reviewed and updated as indicated. Interim medical history since our last visit reviewed. Allergies and medications reviewed and updated.  Review of Systems  Per HPI unless specifically indicated above     Objective:    BP (!) 161/69 (BP Location: Left Arm, Cuff Size: Normal)   Pulse 62   Ht 5\' 6"  (1.676 m)   Wt 149 lb 12.8 oz (67.9 kg)   SpO2  98%   BMI 24.18 kg/m   Wt Readings from Last 3 Encounters:  08/04/23 149 lb 12.8 oz (67.9 kg)  06/02/23 149 lb 3.2 oz (67.7 kg)  04/30/23 142 lb 12.8 oz (64.8 kg)    Physical Exam  Results for orders placed or performed in visit on 07/04/23  HM DIABETES EYE EXAM   Collection Time: 04/30/23  9:50 AM  Result Value Ref Range   HM Diabetic Eye Exam No Retinopathy No Retinopathy      Assessment & Plan:   Problem List Items Addressed This Visit       Cardiovascular and Mediastinum   CAD (coronary artery disease)   Relevant Orders   CBC with Differential/Platelet   Comprehensive metabolic panel   Hypertension   Relevant Orders   CBC with Differential/Platelet   Comprehensive metabolic panel     Endocrine   Controlled diabetes mellitus type 2 with complications (HCC)   Relevant Orders   Bayer DCA Hb A1c Waived   CBC with Differential/Platelet   Comprehensive metabolic panel     Other   Hyperlipidemia - Primary   Relevant Orders   CBC with Differential/Platelet  Comprehensive metabolic panel   Lipid Panel w/o Chol/HDL Ratio   Depression   Relevant Orders   CBC with Differential/Platelet   Comprehensive metabolic panel   W09 deficiency   Relevant Orders   CBC with Differential/Platelet   Comprehensive metabolic panel   W11     Follow up plan: No follow-ups on file.

## 2023-08-04 NOTE — Patient Instructions (Signed)
TAKE 2 of the amlodipine a day (5mg  total) until you see me next time.

## 2023-08-04 NOTE — Telephone Encounter (Signed)
Patient most recent Diabetic Eye Exam was requested at today's visit. 

## 2023-08-04 NOTE — Telephone Encounter (Signed)
-----   Message from Olevia Perches sent at 08/04/2023  2:20 PM EST ----- Eye exam from Dr Clydene Pugh please

## 2023-08-05 LAB — CBC WITH DIFFERENTIAL/PLATELET
Basophils Absolute: 0.1 10*3/uL (ref 0.0–0.2)
Basos: 2 %
EOS (ABSOLUTE): 0.1 10*3/uL (ref 0.0–0.4)
Eos: 1 %
Hematocrit: 44 % (ref 37.5–51.0)
Hemoglobin: 14.6 g/dL (ref 13.0–17.7)
Immature Grans (Abs): 0 10*3/uL (ref 0.0–0.1)
Immature Granulocytes: 0 %
Lymphocytes Absolute: 1.7 10*3/uL (ref 0.7–3.1)
Lymphs: 31 %
MCH: 30.5 pg (ref 26.6–33.0)
MCHC: 33.2 g/dL (ref 31.5–35.7)
MCV: 92 fL (ref 79–97)
Monocytes Absolute: 0.4 10*3/uL (ref 0.1–0.9)
Monocytes: 7 %
Neutrophils Absolute: 3.2 10*3/uL (ref 1.4–7.0)
Neutrophils: 59 %
Platelets: 214 10*3/uL (ref 150–450)
RBC: 4.78 x10E6/uL (ref 4.14–5.80)
RDW: 12.8 % (ref 11.6–15.4)
WBC: 5.4 10*3/uL (ref 3.4–10.8)

## 2023-08-05 LAB — COMPREHENSIVE METABOLIC PANEL
ALT: 14 [IU]/L (ref 0–44)
AST: 15 [IU]/L (ref 0–40)
Albumin: 4.4 g/dL (ref 3.8–4.8)
Alkaline Phosphatase: 92 [IU]/L (ref 44–121)
BUN/Creatinine Ratio: 15 (ref 10–24)
BUN: 19 mg/dL (ref 8–27)
Bilirubin Total: 0.2 mg/dL (ref 0.0–1.2)
CO2: 25 mmol/L (ref 20–29)
Calcium: 9.5 mg/dL (ref 8.6–10.2)
Chloride: 103 mmol/L (ref 96–106)
Creatinine, Ser: 1.27 mg/dL (ref 0.76–1.27)
Globulin, Total: 2 g/dL (ref 1.5–4.5)
Glucose: 136 mg/dL — ABNORMAL HIGH (ref 70–99)
Potassium: 4.8 mmol/L (ref 3.5–5.2)
Sodium: 142 mmol/L (ref 134–144)
Total Protein: 6.4 g/dL (ref 6.0–8.5)
eGFR: 60 mL/min/{1.73_m2} (ref >59)

## 2023-08-05 LAB — LIPID PANEL W/O CHOL/HDL RATIO
Cholesterol, Total: 230 mg/dL — ABNORMAL HIGH (ref 100–199)
HDL: 59 mg/dL (ref >39)
LDL Chol Calc (NIH): 155 mg/dL — ABNORMAL HIGH (ref 0–99)
Triglycerides: 89 mg/dL (ref 0–149)
VLDL Cholesterol Cal: 16 mg/dL (ref 5–40)

## 2023-08-05 LAB — VITAMIN B12: Vitamin B-12: 527 pg/mL (ref 232–1245)

## 2023-08-06 ENCOUNTER — Encounter: Payer: Self-pay | Admitting: Family Medicine

## 2023-08-06 NOTE — Assessment & Plan Note (Signed)
Doing well not on medicine. Continue to monitor. Call with any concerns.

## 2023-08-06 NOTE — Assessment & Plan Note (Signed)
 Rechecking labs today. Await results. Treat as needed.

## 2023-08-06 NOTE — Assessment & Plan Note (Signed)
 Rechecking labs today. Await results. Will likely need to restart his medicine. Continue to monitor. Call with any concerns.

## 2023-08-06 NOTE — Assessment & Plan Note (Signed)
 Will keep his sugars, BP and cholesterol under good control. Continue to monitor. Call with any concerns.

## 2023-08-06 NOTE — Assessment & Plan Note (Signed)
 Not under good control. Will make sure he's taking his medicine and recheck in 1 month. Call with any concerns. Continue to monitor.

## 2023-08-06 NOTE — Assessment & Plan Note (Signed)
Doing well with A1c of 6.1. Continue current regimen. Continue to monitor. Call with any concerns.  

## 2023-08-07 DIAGNOSIS — H5203 Hypermetropia, bilateral: Secondary | ICD-10-CM | POA: Diagnosis not present

## 2023-08-07 DIAGNOSIS — H2513 Age-related nuclear cataract, bilateral: Secondary | ICD-10-CM | POA: Diagnosis not present

## 2023-08-14 ENCOUNTER — Encounter: Payer: Self-pay | Admitting: Family Medicine

## 2023-08-18 ENCOUNTER — Other Ambulatory Visit (INDEPENDENT_AMBULATORY_CARE_PROVIDER_SITE_OTHER): Payer: Self-pay | Admitting: Vascular Surgery

## 2023-08-18 DIAGNOSIS — I6523 Occlusion and stenosis of bilateral carotid arteries: Secondary | ICD-10-CM

## 2023-08-22 ENCOUNTER — Encounter (INDEPENDENT_AMBULATORY_CARE_PROVIDER_SITE_OTHER): Payer: Medicare Other

## 2023-08-22 ENCOUNTER — Ambulatory Visit (INDEPENDENT_AMBULATORY_CARE_PROVIDER_SITE_OTHER): Payer: Medicare Other | Admitting: Vascular Surgery

## 2023-09-15 ENCOUNTER — Ambulatory Visit: Payer: Medicare Other | Admitting: Family Medicine

## 2023-09-24 ENCOUNTER — Other Ambulatory Visit: Payer: Self-pay | Admitting: Family Medicine

## 2023-09-25 NOTE — Telephone Encounter (Signed)
 Requested Prescriptions  Pending Prescriptions Disp Refills   valsartan (DIOVAN) 40 MG tablet [Pharmacy Med Name: VALSARTAN 40 MG TABLET] 90 tablet 1    Sig: TAKE 1 TABLET BY MOUTH EVERY DAY     Cardiovascular:  Angiotensin Receptor Blockers Failed - 09/25/2023 12:33 PM      Failed - Last BP in normal range    BP Readings from Last 1 Encounters:  08/04/23 (!) 180/80         Passed - Cr in normal range and within 180 days    Creatinine  Date Value Ref Range Status  07/19/2012 1.12 0.60 - 1.30 mg/dL Final   Creatinine, Ser  Date Value Ref Range Status  08/04/2023 1.27 0.76 - 1.27 mg/dL Final         Passed - K in normal range and within 180 days    Potassium  Date Value Ref Range Status  08/04/2023 4.8 3.5 - 5.2 mmol/L Final  07/19/2012 3.6 3.5 - 5.1 mmol/L Final         Passed - Patient is not pregnant      Passed - Valid encounter within last 6 months    Recent Outpatient Visits           1 month ago Mixed hyperlipidemia   [place] [facility] [place], Oralia Rud, DO       Future Appointments             In 5 days Dorcas Carrow, DO [place] [facility]

## 2023-09-30 ENCOUNTER — Encounter: Payer: Self-pay | Admitting: Family Medicine

## 2023-09-30 ENCOUNTER — Ambulatory Visit (INDEPENDENT_AMBULATORY_CARE_PROVIDER_SITE_OTHER): Payer: Medicare Other | Admitting: Family Medicine

## 2023-09-30 VITALS — BP 151/73 | HR 80 | Wt 152.2 lb

## 2023-09-30 DIAGNOSIS — I10 Essential (primary) hypertension: Secondary | ICD-10-CM

## 2023-09-30 MED ORDER — AMLODIPINE BESYLATE 5 MG PO TABS: 7.5000 mg | ORAL_TABLET | Freq: Every day | ORAL | 0 refills | Status: DC

## 2023-09-30 NOTE — Patient Instructions (Signed)
 Keep Taking the valsartan 1 pill a day Increase your amlodipine to 1.5 pills (7.5mg )  STOP Rosuvastatin- you can throw that away I'll see you in 6 weeks.

## 2023-09-30 NOTE — Assessment & Plan Note (Signed)
 Better, but still running high. Will increase his amlodipine to 7.5mg  and recheck 6 weeks. Call with any concerns.

## 2023-09-30 NOTE — Progress Notes (Signed)
 BP (!) 151/73 (BP Location: Left Arm, Cuff Size: Normal)   Pulse 80   Wt 152 lb 3.2 oz (69 kg)   SpO2 98%   BMI 24.57 kg/m    Subjective:    Patient ID: Nathan Fields, male    DOB: 27-Aug-1950, 73 y.o.   MRN: [id number]  HPI: Nathan Fields is a 73 y.o. male  Chief Complaint  Patient presents with   Hypertension    Patient stop taking Valsartan and Rosuvastatin.    HYPERTENSION  Hypertension status: better  Satisfied with current treatment? no Duration of hypertension: chronic BP monitoring frequency:  not checking BP medication side effects:  no Medication compliance: good compliance Previous BP meds:valsartan, amlodipine Aspirin: yes Recurrent headaches: no Visual changes: no Palpitations: no Dyspnea: no Chest pain: no Lower extremity edema: no Dizzy/lightheaded: no  Relevant past medical, surgical, family and social history reviewed and updated as indicated. Interim medical history since our last visit reviewed. Allergies and medications reviewed and updated.  Review of Systems  Constitutional: Negative.   Respiratory: Negative.    Cardiovascular: Negative.   Gastrointestinal: Negative.   Musculoskeletal: Negative.   Neurological: Negative.   Psychiatric/Behavioral: Negative.      Per HPI unless specifically indicated above     Objective:    BP (!) 151/73 (BP Location: Left Arm, Cuff Size: Normal)   Pulse 80   Wt 152 lb 3.2 oz (69 kg)   SpO2 98%   BMI 24.57 kg/m   Wt Readings from Last 3 Encounters:  09/30/23 152 lb 3.2 oz (69 kg)  08/04/23 149 lb 12.8 oz (67.9 kg)  06/02/23 149 lb 3.2 oz (67.7 kg)    Physical Exam Vitals and nursing note reviewed.  Constitutional:      General: He is not in acute distress.    Appearance: Normal appearance. He is not ill-appearing, toxic-appearing or diaphoretic.  HENT:     Head: Normocephalic and atraumatic.     Right Ear: External ear normal.     Left Ear: External ear normal.     Nose: Nose normal.      Mouth/Throat:     Mouth: Mucous membranes are moist.     Pharynx: Oropharynx is clear.  Eyes:     General: No scleral icterus.       Right eye: No discharge.        Left eye: No discharge.     Extraocular Movements: Extraocular movements intact.     Conjunctiva/sclera: Conjunctivae normal.     Pupils: Pupils are equal, round, and reactive to light.  Cardiovascular:     Rate and Rhythm: Normal rate and regular rhythm.     Pulses: Normal pulses.     Heart sounds: Normal heart sounds. No murmur heard.    No friction rub. No gallop.  Pulmonary:     Effort: Pulmonary effort is normal. No respiratory distress.     Breath sounds: Normal breath sounds. No stridor. No wheezing, rhonchi or rales.  Chest:     Chest wall: No tenderness.  Musculoskeletal:        General: Normal range of motion.     Cervical back: Normal range of motion and neck supple.  Skin:    General: Skin is warm and dry.     Capillary Refill: Capillary refill takes less than 2 seconds.     Coloration: Skin is not jaundiced or pale.     Findings: No bruising, erythema, lesion or rash.  Neurological:  General: No focal deficit present.     Mental Status: He is alert and oriented to person, place, and time. Mental status is at baseline.  Psychiatric:        Mood and Affect: Mood normal.        Behavior: Behavior normal.        Thought Content: Thought content normal.        Judgment: Judgment normal.     Results for orders placed or performed in visit on 08/14/23  HM DIABETES EYE EXAM   Collection Time: 07/24/23 12:00 AM  Result Value Ref Range   HM Diabetic Eye Exam No Retinopathy No Retinopathy      Assessment & Plan:   Problem List Items Addressed This Visit       Cardiovascular and Mediastinum   Hypertension - Primary   Better, but still running high. Will increase his amlodipine to 7.5mg  and recheck 6 weeks. Call with any concerns.       Relevant Medications   amLODipine (NORVASC) 5 MG  tablet     Follow up plan: Return in about 6 weeks (around 11/11/2023).

## 2023-10-07 ENCOUNTER — Ambulatory Visit (INDEPENDENT_AMBULATORY_CARE_PROVIDER_SITE_OTHER): Payer: Medicare Other | Admitting: Vascular Surgery

## 2023-10-07 ENCOUNTER — Encounter (INDEPENDENT_AMBULATORY_CARE_PROVIDER_SITE_OTHER): Payer: Medicare Other

## 2023-10-21 ENCOUNTER — Encounter (INDEPENDENT_AMBULATORY_CARE_PROVIDER_SITE_OTHER): Payer: Self-pay

## 2023-10-21 ENCOUNTER — Ambulatory Visit (INDEPENDENT_AMBULATORY_CARE_PROVIDER_SITE_OTHER): Payer: Medicare Other

## 2023-10-21 ENCOUNTER — Ambulatory Visit (INDEPENDENT_AMBULATORY_CARE_PROVIDER_SITE_OTHER): Payer: Medicare Other | Admitting: Vascular Surgery

## 2023-10-21 DIAGNOSIS — I6523 Occlusion and stenosis of bilateral carotid arteries: Secondary | ICD-10-CM | POA: Diagnosis not present

## 2023-11-18 ENCOUNTER — Encounter (INDEPENDENT_AMBULATORY_CARE_PROVIDER_SITE_OTHER): Payer: Self-pay

## 2023-11-20 ENCOUNTER — Ambulatory Visit: Admitting: Family Medicine

## 2023-12-01 ENCOUNTER — Ambulatory Visit: Admitting: Family Medicine

## 2023-12-26 ENCOUNTER — Ambulatory Visit: Admitting: Family Medicine

## 2024-01-06 ENCOUNTER — Ambulatory Visit: Payer: Self-pay

## 2024-01-06 VITALS — Ht 66.0 in | Wt 155.0 lb

## 2024-01-06 DIAGNOSIS — Z0000 Encounter for general adult medical examination without abnormal findings: Secondary | ICD-10-CM

## 2024-01-06 NOTE — Progress Notes (Signed)
 Subjective:   Nathan Fields is a 73 y.o. who presents for a Medicare Wellness preventive visit.  As a reminder, Annual Wellness Visits don't include a physical exam, and some assessments may be limited, especially if this visit is performed virtually. We may recommend an in-person follow-up visit with your provider if needed.  Visit Complete: Virtual I connected with  Nathan Fields on 01/06/24 by a audio enabled telemedicine application and verified that I am speaking with the correct person using two identifiers.  Patient Location: Home  Provider Location: Office/Clinic  I discussed the limitations of evaluation and management by telemedicine. The patient expressed understanding and agreed to proceed.  Vital Signs: Because this visit was a virtual/telehealth visit, some criteria may be missing or patient reported. Any vitals not documented were not able to be obtained and vitals that have been documented are patient reported.  VideoDeclined- This patient declined Librarian, academic. Therefore the visit was completed with audio only.  Persons Participating in Visit: Patient assisted by Nathan Fields, wife.  AWV Questionnaire: Yes: Patient Medicare AWV questionnaire was completed by the patient on 01/02/24; I have confirmed that all information answered by patient is correct and no changes since this date.  Cardiac Risk Factors include: advanced age (>39men, >41 women);male gender;hypertension;diabetes mellitus;dyslipidemia;Other (see comment), Risk factor comments: CAD     Objective:    Today's Vitals   01/06/24 1354  Weight: 155 lb (70.3 kg)  Height: 5' 6 (1.676 m)   Body mass index is 25.02 kg/m.     01/06/2024    2:07 PM 12/31/2022    3:05 PM 12/05/2021   12:08 PM 09/06/2021    6:00 PM 09/06/2021   11:59 AM 08/30/2021   12:46 PM 12/04/2020    2:31 PM  Advanced Directives  Does Patient Have a Medical Advance Directive? Yes No Yes No Yes Yes Yes  Type of  Estate agent of [place];Living will  Healthcare Power of [employer] of [place];Living will  Does patient want to make changes to medical advance directive? No - Patient declined   No - Patient declined     Copy of Healthcare Power of Attorney in Chart? No - copy requested  Yes - validated most recent copy scanned in chart (See row information)    No - copy requested  Would patient like information on creating a medical advance directive?  No - Patient declined No - Patient declined No - Patient declined       Current Medications (verified) Outpatient Encounter Medications as of 01/06/2024  Medication Sig   amLODipine  (NORVASC ) 5 MG tablet Take 1.5 tablets (7.5 mg total) by mouth daily.   aspirin  81 MG tablet Take 81 mg by mouth daily.   valsartan  (DIOVAN ) 40 MG tablet TAKE 1 TABLET BY MOUTH EVERY DAY (Patient not taking: Reported on 01/06/2024)   No facility-administered encounter medications on file as of 01/06/2024.    Allergies (verified) Crestor  [rosuvastatin ], Statins, Zetia  [ezetimibe ], Niacin , and Niacin  and related   History: Past Medical History:  Diagnosis Date   Anxiety    Aortic atherosclerosis (HCC)    Arthritis of right foot    B12 deficiency    Bilateral carotid artery stenosis 03/21/2021   a.) Carotid doppler 03/21/2021: 50-60% pRICA, 70-99% pLICA   CAD (coronary artery disease) 09/09/2007   a.) LHC 09/09/2007: EF 60%; 30% dLM, 25% pLAD, 25% mLAD, 40% oLCx, 50% pLCx, 30% pRI, 100% pRCA with good  collateral flow; no interventions opting for med mgmt.   Chronic low back pain    Depression    Diastolic dysfunction 08/16/2021   a.)  TTE 08/16/2021: EF >55%; mild AR/MR/TR, trivial PR; G1DD.   GERD (gastroesophageal reflux disease)    History of kidney stones    Hyperlipidemia    Hypertension    Murmur    Neuromuscular disorder (HCC)    Plantar fasciitis    Pneumonia    Right thyroid  nodule 06/15/2021   a.) CTA neck 06/15/2021:  measured 7mm.   Syncope    T2DM (type 2 diabetes mellitus) (HCC)    TIA (transient ischemic attack)    Past Surgical History:  Procedure Laterality Date   CARDIAC CATHETERIZATION     COLONOSCOPY  2011   Polyp removed, follow up in 5 years   COLONOSCOPY WITH PROPOFOL  N/A 01/14/2017   Procedure: COLONOSCOPY WITH PROPOFOL ;  Surgeon: Therisa Bi, MD;  Location: [facility] ENDOSCOPY;  Service: Endoscopy;  Laterality: N/A;   COLONOSCOPY WITH PROPOFOL  N/A 09/17/2022   Procedure: COLONOSCOPY WITH PROPOFOL ;  Surgeon: Therisa Bi, MD;  Location: [facility] ENDOSCOPY;  Service: Gastroenterology;  Laterality: N/A;   ENDARTERECTOMY Left 09/06/2021   Procedure: ENDARTERECTOMY CAROTID;  Surgeon: Marea Selinda RAMAN, MD;  Location: [facility] ORS;  Service: Vascular;  Laterality: Left;   ESOPHAGOGASTRODUODENOSCOPY (EGD) WITH PROPOFOL  N/A 09/17/2022   Procedure: ESOPHAGOGASTRODUODENOSCOPY (EGD) WITH PROPOFOL ;  Surgeon: Therisa Bi, MD;  Location[facility] ENDOSCOPY;  Service: Gastroenterology;  Laterality: N/A;   Family History  Problem Relation Age of Onset   Heart disease Mother    Hyperlipidemia Mother    Diabetes Mother    Congestive Heart Failure Mother    Hypertension Mother    Diabetes Father    Heart attack Father    Hyperlipidemia Father    Social History   Socioeconomic History   Marital status: Married    Spouse name: Nathan Fields   Number of children: 2   Years of education: 9th grade   Highest education level: 8th grade  Occupational History   Occupation: employed  Tobacco Use   Smoking status: Never   Smokeless tobacco: Never  Vaping Use   Vaping status: Never Used  Substance and Sexual Activity   Alcohol use: Not Currently    Comment: on rare occasion   Drug use: Yes    Frequency: 21.0 times per week    Types: Marijuana    Comment: smokes 2-3 times per day   Sexual activity: Not Currently  Other Topics Concern   Not on file  Social History Narrative   Live in the house with wife   Social Drivers of  Corporate investment banker Strain: Low Risk  (01/06/2024)   Overall Financial Resource Strain (CARDIA)    Difficulty of Paying Living Expenses: Not hard at all  Food Insecurity: No Food Insecurity (01/06/2024)   Hunger Vital Sign    Worried About Running Out of Food in the Last Year: Never true    Ran Out of Food in the Last Year: Never true  Transportation Needs: No Transportation Needs (01/06/2024)   PRAPARE - Administrator, Civil Service (Medical): No    Lack of Transportation (Non-Medical): No  Physical Activity: Sufficiently Active (01/06/2024)   Exercise Vital Sign    Days of Exercise per Week: 5 days    Minutes of Exercise per Session: 150+ min  Stress: No Stress Concern Present (01/06/2024)   [employer] of Occupational Health - Occupational Stress Questionnaire  Feeling of Stress: Not at all  Social Connections: Socially Isolated (01/06/2024)   Social Connection and Isolation Panel    Frequency of Communication with Friends and Family: Once a week    Frequency of Social Gatherings with Friends and Family: Once a week    Attends Religious Services: Never    Database administrator or Organizations: No    Attends Engineer, structural: Never    Marital Status: Married    Tobacco Counseling Counseling given: Not Answered    Clinical Intake:  Pre-visit preparation completed: Yes  Pain : No/denies pain     BMI - recorded: 25.02 Nutritional Status: BMI 25 -29 Overweight Nutritional Risks: None Diabetes: Yes CBG done?: No Did pt. bring in CBG monitor from home?: No  Lab Results  Component Value Date   HGBA1C 6.1 (H) 08/04/2023   HGBA1C 6.2 (H) 04/30/2023   HGBA1C 6.5 (H) 02/05/2023     How often do you need to have someone help you when you read instructions, pamphlets, or other written materials from your doctor or pharmacy?: 5 - Always (Illiterate) What is the last grade level you completed in school?: 8th  Interpreter Needed?:  No  Information entered by :: Vina Ned, CMA   Activities of Daily Living     01/06/2024    1:58 PM 01/02/2024   11:22 AM  In your present state of health, do you have any difficulty performing the following activities:  Hearing? 0 0   Vision? 0 1   Difficulty concentrating or making decisions? 1 1   Walking or climbing stairs? 0 0   Dressing or bathing? 0 0   Doing errands, shopping? 0 0   Preparing Food and eating ? N N   Using the Toilet? N N   In the past six months, have you accidently leaked urine? N N   Do you have problems with loss of bowel control? N N   Managing your Medications? CINDERELLA CINDERELLA   Comment wife fills pill box   Managing your Finances? CINDERELLA CINDERELLA   Comment wife Cytogeneticist or managing your Housekeeping? CINDERELLA CINDERELLA   Comment wife manages housekeeping      Proxy-reported    Patient Care Team: Vicci Duwaine SQUIBB, DO as PCP - General (Family Medicine) [facility], [facility] (Optometry) Dew, Selinda RAMAN, MD as Referring Physician (Vascular Surgery) Maree Jannett POUR, MD as Consulting Physician (Neurology) Janit Thresa HERO, DPM as Consulting Physician (Podiatry) Florencio Cara BIRCH, MD as Consulting Physician (Cardiology)  I have updated your Care Teams any recent Medical Services you may have received from other providers in the past year.     Assessment:   This is a routine wellness examination for Nathan Fields.  Hearing/Vision screen Hearing Screening - Comments:: Denies hearing loss  Vision Screening - Comments:: Gets DM eye exam, [facility], [facility]   Goals Addressed             This Visit's Progress    Patient Stated       Stay healthy       Depression Screen     01/06/2024    2:04 PM 09/30/2023    3:24 PM 06/02/2023    3:54 PM 03/20/2023    2:09 PM 02/05/2023    1:18 PM 12/31/2022    3:03 PM 03/21/2022    4:26 PM  PHQ 2/9 Scores  PHQ - 2 Score 0  1 0 4 0 0  PHQ- 9 Score 2  5 3 19  0 5  Exception Documentation  Other- indicate reason in  comment box       Not completed  Patient stated that he was unable to see or read to fill out phq. I offered to help him go through it together and ot declined.         Fall Risk     01/06/2024    2:08 PM 01/02/2024   11:22 AM 03/20/2023    2:09 PM 02/05/2023    1:18 PM 12/31/2022    3:06 PM  Fall Risk   Falls in the past year? 0 0  0 0 0  Number falls in past yr: 0  0 0 0  Injury with Fall? 0  0 0 0  Risk for fall due to : No Fall Risks  No Fall Risks No Fall Risks No Fall Risks  Follow up Falls evaluation completed  Falls evaluation completed Falls evaluation completed Falls prevention discussed;Falls evaluation completed     Proxy-reported    MEDICARE RISK AT HOME:  Medicare Risk at Home Any stairs in or around the home?: Yes If so, are there any without handrails?: No Home free of loose throw rugs in walkways, pet beds, electrical cords, etc?: Yes Adequate lighting in your home to reduce risk of falls?: Yes Life alert?: No Use of a cane, walker or w/c?: No Grab bars in the bathroom?: No Shower chair or bench in shower?: No Elevated toilet seat or a handicapped toilet?: No  TIMED UP AND GO:  Was the test performed?  No  Cognitive Function: 6CIT completed        01/06/2024    2:08 PM 12/31/2022    3:07 PM 12/05/2021   12:08 PM 12/04/2020    2:33 PM 02/23/2018    2:02 PM  6CIT Screen  What Year? 0 points 0 points 0 points 0 points 0 points  What month? 0 points 3 points 3 points 0 points 3 points  What time? 0 points 0 points 0 points 0 points 3 points  Count back from 20 0 points 0 points 0 points 0 points 0 points  Months in reverse 4 points 4 points 4 points 4 points 0 points  Repeat phrase 6 points 2 points 2 points 6 points 4 points  Total Score 10 points 9 points 9 points 10 points 10 points    Immunizations Immunization History  Administered Date(s) Administered   Influenza, High Dose Seasonal PF 05/22/2018    Screening Tests Health Maintenance  Topic Date Due    Zoster Vaccines- Shingrix (1 of 2) Never done   Diabetic kidney evaluation - Urine ACR  02/05/2024   Pneumococcal Vaccine: 50+ Years (1 of 2 - PCV) 03/19/2024 (Originally 10/31/1969)   DTaP/Tdap/Td (1 - Tdap) 08/03/2024 (Originally 10/31/1969)   INFLUENZA VACCINE  01/30/2024   HEMOGLOBIN A1C  02/01/2024   FOOT EXAM  03/19/2024   OPHTHALMOLOGY EXAM  07/23/2024   Diabetic kidney evaluation - eGFR measurement  08/03/2024   Medicare Annual Wellness (AWV)  01/05/2025   Colonoscopy  09/17/2027   Hepatitis C Screening  Completed   Hepatitis B Vaccines  Aged Out   HPV VACCINES  Aged Out   Meningococcal B Vaccine  Aged Out   COVID-19 Vaccine  Discontinued    Health Maintenance  Health Maintenance Due  Topic Date Due   Zoster Vaccines- Shingrix (1 of 2) Never done   Diabetic kidney evaluation - Urine ACR  02/05/2024   Health Maintenance Items Addressed: See Nurse Notes at the end of this note  Additional Screening:  Vision Screening: Recommended annual ophthalmology exams for early detection of glaucoma and other disorders of the eye. Would you like a referral to an eye doctor? No    Dental Screening: Recommended annual dental exams for proper oral hygiene  Community Resource Referral / Chronic Care Management: CRR required this visit?  No   CCM required this visit?  No   Plan:    I have personally reviewed and noted the following in the patient's chart:   Medical and social history Use of alcohol, tobacco or illicit drugs  Current medications and supplements including opioid prescriptions. Patient is not currently taking opioid prescriptions. Functional ability and status Nutritional status Physical activity Advanced directives List of other physicians Hospitalizations, surgeries, and ER visits in previous 12 months Vitals Screenings to include cognitive, depression, and falls Referrals and appointments  In addition, I have reviewed and discussed with patient certain  preventive protocols, quality metrics, and best practice recommendations. A written personalized care plan for preventive services as well as general preventive health recommendations were provided to patient.   Vina Ned, CMA   01/06/2024   After Visit Summary: (MyChart) Due to this being a telephonic visit, the after visit summary with patients personalized plan was offered to patient via MyChart   Notes:  6 CIT Score - 10 Patient's wife, Nathan Fields was present during today's visit Declined DM & Nutrition education referral Declined all immunizations

## 2024-01-06 NOTE — Patient Instructions (Signed)
 Nathan Fields , Thank you for taking time out of your busy schedule to complete your Annual Wellness Visit with me. I enjoyed our conversation and look forward to speaking with you again next year. I, as well as your care team,  appreciate your ongoing commitment to your health goals. Please review the following plan we discussed and let me know if I can assist you in the future. Your Game plan/ To Do List    Referrals: None  Follow up Visits: Next Medicare AWV with our clinical staff: 01/11/25 @ 1:50pm (PHONE VISIT)   Have you seen your provider in the last 6 months (3 months if uncontrolled diabetes)? Yes Next Office Visit with your provider: 01/13/24 @ 3:20pm with Dr. Vicci  Clinician Recommendations:  Aim for 30 minutes of exercise or brisk walking, 6-8 glasses of water , and 5 servings of fruits and vegetables each day.       This is a list of the screening recommended for you and due dates:  Health Maintenance  Topic Date Due   Zoster (Shingles) Vaccine (1 of 2) Never done   Yearly kidney health urinalysis for diabetes  02/05/2024   Pneumococcal Vaccine for age over 49 (1 of 2 - PCV) 03/19/2024*   DTaP/Tdap/Td vaccine (1 - Tdap) 08/03/2024*   Flu Shot  01/30/2024   Hemoglobin A1C  02/01/2024   Complete foot exam   03/19/2024   Eye exam for diabetics  07/23/2024   Yearly kidney function blood test for diabetes  08/03/2024   Medicare Annual Wellness Visit  01/05/2025   Colon Cancer Screening  09/17/2027   Hepatitis C Screening  Completed   Hepatitis B Vaccine  Aged Out   HPV Vaccine  Aged Out   Meningitis B Vaccine  Aged Out   COVID-19 Vaccine  Discontinued  *Topic was postponed. The date shown is not the original due date.    Advanced directives: (Copy Requested) Please bring a copy of your health care power of attorney and living will to the office to be added to your chart at your convenience. You can mail to [facility] [address] [place], [place]  [address] or email to [email or web address] Advance Care Planning is important because it:  [x]  Makes sure you receive the medical care that is consistent with your values, goals, and preferences  [x]  It provides guidance to your family and loved ones and reduces their decisional burden about whether or not they are making the right decisions based on your wishes.  Follow the link provided in your after visit summary or read over the paperwork we have mailed to you to help you started getting your Advance Directives in place. If you need assistance in completing these, please reach out to us  so that we can help you!  See attachments for Preventive Care and Fall Prevention Tips.   Fall Prevention in the Home, Adult Falls can cause injuries and affect people of all ages. There are many simple things that you can do to make your home safe and to help prevent falls. If you need it, ask for help making these changes. What actions can I take to prevent falls? General information Use good lighting in all rooms. Make sure to: Replace any light bulbs that burn out. Turn on lights if it is dark and use night-lights. Keep items that you use often in easy-to-reach places. Lower the shelves around your home if needed. Move furniture so that there are clear paths around  it. Do not keep throw rugs or other things on the floor that can make you trip. If any of your floors are uneven, fix them. Add color or contrast paint or tape to clearly mark and help you see: Grab bars or handrails. First and last steps of staircases. Where the edge of each step is. If you use a ladder or stepladder: Make sure that it is fully opened. Do not climb a closed ladder. Make sure the sides of the ladder are locked in place. Have someone hold the ladder while you use it. Know where your pets are as you move through your home. What can I do in the bathroom?     Keep the floor dry. Clean up any water  that is on the  floor right away. Remove soap buildup in the bathtub or shower. Buildup makes bathtubs and showers slippery. Use non-skid mats or decals on the floor of the bathtub or shower. Attach bath mats securely with double-sided, non-slip rug tape. If you need to sit down while you are in the shower, use a non-slip stool. Install grab bars by the toilet and in the bathtub and shower. Do not use towel bars as grab bars. What can I do in the bedroom? Make sure that you have a light by your bed that is easy to reach. Do not use any sheets or blankets on your bed that hang to the floor. Have a firm bench or chair with side arms that you can use for support when you get dressed. What can I do in the kitchen? Clean up any spills right away. If you need to reach something above you, use a sturdy step stool that has a grab bar. Keep electrical cables out of the way. Do not use floor polish or wax that makes floors slippery. What can I do with my stairs? Do not leave anything on the stairs. Make sure that you have a light switch at the top and the bottom of the stairs. Have them installed if you do not have them. Make sure that there are handrails on both sides of the stairs. Fix handrails that are broken or loose. Make sure that handrails are as long as the staircases. Install non-slip stair treads on all stairs in your home if they do not have carpet. Avoid having throw rugs at the top or bottom of stairs, or secure the rugs with carpet tape to prevent them from moving. Choose a carpet design that does not hide the edge of steps on the stairs. Make sure that carpet is firmly attached to the stairs. Fix any carpet that is loose or worn. What can I do on the outside of my home? Use bright outdoor lighting. Repair the edges of walkways and driveways and fix any cracks. Clear paths of anything that can make you trip, such as tools or rocks. Add color or contrast paint or tape to clearly mark and help you see  high doorway thresholds. Trim any bushes or trees on the main path into your home. Check that handrails are securely fastened and in good repair. Both sides of all steps should have handrails. Install guardrails along the edges of any raised decks or porches. Have leaves, snow, and ice cleared regularly. Use sand, salt, or ice melt on walkways during winter months if you live where there is ice and snow. In the garage, clean up any spills right away, including grease or oil spills. What other actions can I take? Review your  medicines with your health care provider. Some medicines can make you confused or feel dizzy. This can increase your chance of falling. Wear closed-toe shoes that fit well and support your feet. Wear shoes that have rubber soles and low heels. Use a cane, walker, scooter, or crutches that help you move around if needed. Talk with your provider about other ways that you can decrease your risk of falls. This may include seeing a physical therapist to learn to do exercises to improve movement and strength. Where to find more information Centers for Disease Control and Prevention, [employer]ADI: TonerPromos.no [employer] on Aging: BaseRingTones.pl National Institute on Aging: BaseRingTones.pl Contact a health care provider if: You are afraid of falling at home. You feel weak, drowsy, or dizzy at home. You fall at home. Get help right away if you: Lose consciousness or have trouble moving after a fall. Have a fall that causes a head injury. These symptoms may be an emergency. Get help right away. Call 911. Do not wait to see if the symptoms will go away. Do not drive yourself to the hospital. This information is not intended to replace advice given to you by your health care provider. Make sure you discuss any questions you have with your health care provider. Document Revised: 02/18/2022 Document Reviewed: 02/18/2022 Elsevier Patient Education  2024 [employer].

## 2024-01-13 ENCOUNTER — Ambulatory Visit (INDEPENDENT_AMBULATORY_CARE_PROVIDER_SITE_OTHER): Admitting: Family Medicine

## 2024-01-13 ENCOUNTER — Telehealth: Payer: Self-pay | Admitting: Family Medicine

## 2024-01-13 DIAGNOSIS — Z5321 Procedure and treatment not carried out due to patient leaving prior to being seen by health care provider: Secondary | ICD-10-CM

## 2024-01-13 NOTE — Telephone Encounter (Signed)
 Patient left without being seen. Please get rescheduled.

## 2024-01-13 NOTE — Telephone Encounter (Signed)
 Ok for E2C2 to review.  Left message for patient. Please transfer call to clinic so I can directly assist.

## 2024-01-13 NOTE — Progress Notes (Signed)
 Patient left without being seen.

## 2024-03-02 DIAGNOSIS — R011 Cardiac murmur, unspecified: Secondary | ICD-10-CM | POA: Diagnosis not present

## 2024-03-02 DIAGNOSIS — I10 Essential (primary) hypertension: Secondary | ICD-10-CM | POA: Diagnosis not present

## 2024-03-02 DIAGNOSIS — R200 Anesthesia of skin: Secondary | ICD-10-CM | POA: Diagnosis not present

## 2024-03-02 DIAGNOSIS — I2510 Atherosclerotic heart disease of native coronary artery without angina pectoris: Secondary | ICD-10-CM | POA: Diagnosis not present

## 2024-03-02 DIAGNOSIS — E782 Mixed hyperlipidemia: Secondary | ICD-10-CM | POA: Diagnosis not present

## 2024-03-02 DIAGNOSIS — R001 Bradycardia, unspecified: Secondary | ICD-10-CM | POA: Diagnosis not present

## 2024-03-18 ENCOUNTER — Ambulatory Visit (INDEPENDENT_AMBULATORY_CARE_PROVIDER_SITE_OTHER): Admitting: Family Medicine

## 2024-03-18 ENCOUNTER — Encounter: Payer: Self-pay | Admitting: Family Medicine

## 2024-03-18 VITALS — BP 138/62 | HR 61 | Temp 97.4°F | Ht 66.0 in | Wt 144.2 lb

## 2024-03-18 DIAGNOSIS — E538 Deficiency of other specified B group vitamins: Secondary | ICD-10-CM | POA: Diagnosis not present

## 2024-03-18 DIAGNOSIS — I2510 Atherosclerotic heart disease of native coronary artery without angina pectoris: Secondary | ICD-10-CM

## 2024-03-18 DIAGNOSIS — E118 Type 2 diabetes mellitus with unspecified complications: Secondary | ICD-10-CM

## 2024-03-18 DIAGNOSIS — I10 Essential (primary) hypertension: Secondary | ICD-10-CM | POA: Diagnosis not present

## 2024-03-18 DIAGNOSIS — E782 Mixed hyperlipidemia: Secondary | ICD-10-CM

## 2024-03-18 DIAGNOSIS — E119 Type 2 diabetes mellitus without complications: Secondary | ICD-10-CM | POA: Diagnosis not present

## 2024-03-18 LAB — BAYER DCA HB A1C WAIVED: HB A1C (BAYER DCA - WAIVED): 5.9 % — ABNORMAL HIGH (ref 4.8–5.6)

## 2024-03-18 LAB — MICROALBUMIN, URINE WAIVED
Creatinine, Urine Waived: 200 mg/dL (ref 10–300)
Microalb, Ur Waived: 30 mg/L — ABNORMAL HIGH (ref 0–19)
Microalb/Creat Ratio: <30 mg/g (ref <30)

## 2024-03-18 MED ORDER — AMLODIPINE BESYLATE 10 MG PO TABS: 10.0000 mg | ORAL_TABLET | Freq: Every day | ORAL | 0 refills | Status: DC

## 2024-03-18 NOTE — Assessment & Plan Note (Signed)
 Encouraged patient to start his zetia  and pravastatin . Call with any concerns. Continue to monitor.

## 2024-03-18 NOTE — Assessment & Plan Note (Signed)
 Will make sure to keep his blood pressure, sugar and cholesterol under good control. Continue to follow with cardiology. Call with any concerns.

## 2024-03-18 NOTE — Progress Notes (Signed)
 BP 138/62   Pulse 61   Temp (!) 97.4 F (36.3 C)   Ht 5' 6 (1.676 m)   Wt 144 lb 3.2 oz (65.4 kg)   SpO2 97%   BMI 23.27 kg/m    Subjective:    Patient ID: Nathan Fields, male    DOB: 01-Aug-1950, 73 y.o.   MRN: [id number]  HPI: COBIN CADAVID is a 73 y.o. male  Chief Complaint  Patient presents with   Hypertension    Pt presents today for a Bp check. States he has been in compliance with his medication. Pt also went to cardiologist and was told he needed to go back on his statins, he has not started taking the medications prescribed as of yet.   DIABETES Hypoglycemic episodes:no Polydipsia/polyuria: no Visual disturbance: no Chest pain: no Paresthesias: no Glucose Monitoring: no  Accucheck frequency: Not Checking Taking Insulin?: no Blood Pressure Monitoring: not checking Retinal Examination: Up to Date Foot Exam: Up to Date Diabetic Education: Completed Pneumovax: Not up to Date Influenza: Not up to Date Aspirin : yes  HYPERTENSION / HYPERLIPIDEMIA Satisfied with current treatment? yes Duration of hypertension: chronic BP monitoring frequency: not checking BP medication side effects: no Past BP meds: amlodipine  Duration of hyperlipidemia: chronic Cholesterol medication side effects: no Cholesterol supplements: none Past cholesterol medications: to be taking zetia  and pravastatin - has not started them Medication compliance: fair compliance Aspirin : yes Recent stressors: no Recurrent headaches: no Visual changes: no Palpitations: no Dyspnea: no Chest pain: no Lower extremity edema: no Dizzy/lightheaded: no  Relevant past medical, surgical, family and social history reviewed and updated as indicated. Interim medical history since our last visit reviewed. Allergies and medications reviewed and updated.  Review of Systems  Constitutional: Negative.   Respiratory: Negative.    Cardiovascular: Negative.   Musculoskeletal: Negative.   Neurological:  Negative.   Psychiatric/Behavioral: Negative.      Per HPI unless specifically indicated above     Objective:    BP 138/62   Pulse 61   Temp (!) 97.4 F (36.3 C)   Ht 5' 6 (1.676 m)   Wt 144 lb 3.2 oz (65.4 kg)   SpO2 97%   BMI 23.27 kg/m   Wt Readings from Last 3 Encounters:  03/18/24 144 lb 3.2 oz (65.4 kg)  01/06/24 155 lb (70.3 kg)  09/30/23 152 lb 3.2 oz (69 kg)    Physical Exam Vitals and nursing note reviewed.  Constitutional:      General: He is not in acute distress.    Appearance: Normal appearance. He is not ill-appearing, toxic-appearing or diaphoretic.  HENT:     Head: Normocephalic and atraumatic.     Right Ear: External ear normal.     Left Ear: External ear normal.     Nose: Nose normal.     Mouth/Throat:     Mouth: Mucous membranes are moist.     Pharynx: Oropharynx is clear.  Eyes:     General: No scleral icterus.       Right eye: No discharge.        Left eye: No discharge.     Extraocular Movements: Extraocular movements intact.     Conjunctiva/sclera: Conjunctivae normal.     Pupils: Pupils are equal, round, and reactive to light.  Cardiovascular:     Rate and Rhythm: Normal rate and regular rhythm.     Pulses: Normal pulses.     Heart sounds: Normal heart sounds. No murmur heard.  No friction rub. No gallop.  Pulmonary:     Effort: Pulmonary effort is normal. No respiratory distress.     Breath sounds: Normal breath sounds. No stridor. No wheezing, rhonchi or rales.  Chest:     Chest wall: No tenderness.  Musculoskeletal:        General: Normal range of motion.     Cervical back: Normal range of motion and neck supple.  Skin:    General: Skin is warm and dry.     Capillary Refill: Capillary refill takes less than 2 seconds.     Coloration: Skin is not jaundiced or pale.     Findings: No bruising, erythema, lesion or rash.  Neurological:     General: No focal deficit present.     Mental Status: He is alert and oriented to person,  place, and time. Mental status is at baseline.  Psychiatric:        Mood and Affect: Mood normal.        Behavior: Behavior normal.        Thought Content: Thought content normal.        Judgment: Judgment normal.     Results for orders placed or performed in visit on 08/14/23  HM DIABETES EYE EXAM   Collection Time: 07/24/23 12:00 AM  Result Value Ref Range   HM Diabetic Eye Exam No Retinopathy No Retinopathy      Assessment & Plan:   Problem List Items Addressed This Visit       Cardiovascular and Mediastinum   CAD (coronary artery disease)   Will make sure to keep his blood pressure, sugar and cholesterol under good control. Continue to follow with cardiology. Call with any concerns.       Relevant Medications   pravastatin  (PRAVACHOL ) 20 MG tablet   ezetimibe  (ZETIA ) 10 MG tablet   amLODipine  (NORVASC ) 10 MG tablet   Other Relevant Orders   CBC with Differential/Platelet   Comprehensive metabolic panel with GFR   Hypertension   Still running a little high. Will stop valsartan  to help with pill burden. Increase amlodipine  to 10mg  daily. Call with any concerns.       Relevant Medications   pravastatin  (PRAVACHOL ) 20 MG tablet   ezetimibe  (ZETIA ) 10 MG tablet   amLODipine  (NORVASC ) 10 MG tablet   Other Relevant Orders   CBC with Differential/Platelet   Comprehensive metabolic panel with GFR   Microalbumin, Urine Waived     Endocrine   Controlled diabetes mellitus type 2 with complications (HCC)   Doing great with A1c of 5.9. Continue diet and exercise. Call with any concerns. Continue to monitor.       Relevant Medications   pravastatin  (PRAVACHOL ) 20 MG tablet   Other Relevant Orders   CBC with Differential/Platelet   Comprehensive metabolic panel with GFR     Other   Hyperlipidemia - Primary   Encouraged patient to start his zetia  and pravastatin . Call with any concerns. Continue to monitor.       Relevant Medications   pravastatin  (PRAVACHOL ) 20 MG  tablet   ezetimibe  (ZETIA ) 10 MG tablet   amLODipine  (NORVASC ) 10 MG tablet   Other Relevant Orders   CBC with Differential/Platelet   Comprehensive metabolic panel with GFR   Lipid Panel w/o Chol/HDL Ratio   B12 deficiency   Rechecking labs today. Await results. Treat as needed.       Relevant Orders   CBC with Differential/Platelet   Comprehensive metabolic panel with GFR  B12   Other Visit Diagnoses       Type 2 diabetes mellitus without complication, unspecified whether long term insulin use (HCC)       Relevant Medications   pravastatin  (PRAVACHOL ) 20 MG tablet   Other Relevant Orders   Bayer DCA Hb A1c Waived        Follow up plan: Return in about 3 months (around 06/17/2024).

## 2024-03-18 NOTE — Assessment & Plan Note (Signed)
 Rechecking labs today. Await results. Treat as needed.

## 2024-03-18 NOTE — Assessment & Plan Note (Signed)
Doing great with A1c of 5.9. Continue diet and exercise. Call with any concerns. Continue to monitor.  

## 2024-03-18 NOTE — Assessment & Plan Note (Signed)
 Still running a little high. Will stop valsartan  to help with pill burden. Increase amlodipine  to 10mg  daily. Call with any concerns.

## 2024-03-19 LAB — CBC WITH DIFFERENTIAL/PLATELET
Basophils Absolute: 0.1 x10E3/uL (ref 0.0–0.2)
Basos: 1 %
EOS (ABSOLUTE): 0.1 x10E3/uL (ref 0.0–0.4)
Eos: 1 %
Hematocrit: 44.8 % (ref 37.5–51.0)
Hemoglobin: 14.6 g/dL (ref 13.0–17.7)
Immature Grans (Abs): 0 x10E3/uL (ref 0.0–0.1)
Immature Granulocytes: 0 %
Lymphocytes Absolute: 1.4 x10E3/uL (ref 0.7–3.1)
Lymphs: 20 %
MCH: 30.4 pg (ref 26.6–33.0)
MCHC: 32.6 g/dL (ref 31.5–35.7)
MCV: 93 fL (ref 79–97)
Monocytes Absolute: 0.5 x10E3/uL (ref 0.1–0.9)
Monocytes: 7 %
Neutrophils Absolute: 5.1 x10E3/uL (ref 1.4–7.0)
Neutrophils: 71 %
Platelets: 239 x10E3/uL (ref 150–450)
RBC: 4.81 x10E6/uL (ref 4.14–5.80)
RDW: 12.8 % (ref 11.6–15.4)
WBC: 7.2 x10E3/uL (ref 3.4–10.8)

## 2024-03-19 LAB — COMPREHENSIVE METABOLIC PANEL WITH GFR
ALT: 13 IU/L (ref 0–44)
AST: 17 IU/L (ref 0–40)
Albumin: 4.4 g/dL (ref 3.8–4.8)
Alkaline Phosphatase: 99 IU/L (ref 47–123)
BUN/Creatinine Ratio: 15 (ref 10–24)
BUN: 17 mg/dL (ref 8–27)
Bilirubin Total: 0.4 mg/dL (ref 0.0–1.2)
CO2: 24 mmol/L (ref 20–29)
Calcium: 9.4 mg/dL (ref 8.6–10.2)
Chloride: 102 mmol/L (ref 96–106)
Creatinine, Ser: 1.11 mg/dL (ref 0.76–1.27)
Globulin, Total: 2.3 g/dL (ref 1.5–4.5)
Glucose: 87 mg/dL (ref 70–99)
Potassium: 4 mmol/L (ref 3.5–5.2)
Sodium: 143 mmol/L (ref 134–144)
Total Protein: 6.7 g/dL (ref 6.0–8.5)
eGFR: 71 mL/min/1.73 (ref >59)

## 2024-03-19 LAB — LIPID PANEL W/O CHOL/HDL RATIO
Cholesterol, Total: 242 mg/dL — ABNORMAL HIGH (ref 100–199)
HDL: 65 mg/dL (ref >39)
LDL Chol Calc (NIH): 163 mg/dL — ABNORMAL HIGH (ref 0–99)
Triglycerides: 84 mg/dL (ref 0–149)
VLDL Cholesterol Cal: 14 mg/dL (ref 5–40)

## 2024-03-19 LAB — VITAMIN B12: Vitamin B-12: 542 pg/mL (ref 232–1245)

## 2024-03-25 ENCOUNTER — Ambulatory Visit: Payer: Self-pay | Admitting: Family Medicine

## 2024-03-29 ENCOUNTER — Ambulatory Visit: Admitting: Family Medicine

## 2024-06-13 ENCOUNTER — Other Ambulatory Visit: Payer: Self-pay | Admitting: Family Medicine

## 2024-06-16 NOTE — Telephone Encounter (Signed)
 Requested Prescriptions  Pending Prescriptions Disp Refills   amLODipine  (NORVASC ) 10 MG tablet [Pharmacy Med Name: AMLODIPINE  BESYLATE 10 MG TAB] 90 tablet 0    Sig: TAKE 1 TABLET BY MOUTH EVERY DAY     Cardiovascular: Calcium  Channel Blockers 2 Passed - 06/16/2024  9:25 AM      Passed - Last BP in normal range    BP Readings from Last 1 Encounters:  03/18/24 138/62         Passed - Last Heart Rate in normal range    Pulse Readings from Last 1 Encounters:  03/18/24 61         Passed - Valid encounter within last 6 months    Recent Outpatient Visits           3 months ago Mixed hyperlipidemia   [place] [facility] [place], Megan P, DO   5 months ago Patient left without being seen   [facility] [place], Megan P, DO   8 months ago Primary hypertension   [facility] [place], Megan P, DO   10 months ago Mixed hyperlipidemia   [place] [facility] [employer], [place], DO

## 2024-06-17 ENCOUNTER — Encounter: Payer: Self-pay | Admitting: Family Medicine

## 2024-06-17 ENCOUNTER — Ambulatory Visit: Admitting: Family Medicine

## 2024-06-17 VITALS — BP 139/78 | HR 67 | Temp 98.2°F | Resp 15 | Ht 65.98 in | Wt 144.0 lb

## 2024-06-17 DIAGNOSIS — I10 Essential (primary) hypertension: Secondary | ICD-10-CM | POA: Diagnosis not present

## 2024-06-17 DIAGNOSIS — E782 Mixed hyperlipidemia: Secondary | ICD-10-CM

## 2024-06-17 DIAGNOSIS — R809 Proteinuria, unspecified: Secondary | ICD-10-CM

## 2024-06-17 DIAGNOSIS — I77810 Thoracic aortic ectasia: Secondary | ICD-10-CM | POA: Insufficient documentation

## 2024-06-17 DIAGNOSIS — E1129 Type 2 diabetes mellitus with other diabetic kidney complication: Secondary | ICD-10-CM | POA: Diagnosis not present

## 2024-06-17 LAB — BAYER DCA HB A1C WAIVED: HB A1C (BAYER DCA - WAIVED): 5.8 % — ABNORMAL HIGH (ref 4.8–5.6)

## 2024-06-17 MED ORDER — AMLODIPINE BESYLATE 10 MG PO TABS: 10.0000 mg | ORAL_TABLET | Freq: Every day | ORAL | 1 refills | Status: AC

## 2024-06-17 MED ORDER — PRAVASTATIN SODIUM 20 MG PO TABS: 20.0000 mg | ORAL_TABLET | ORAL | 0 refills | Status: AC

## 2024-06-17 NOTE — Assessment & Plan Note (Signed)
 Under good control on current regimen. Continue current regimen. Continue to monitor. Call with any concerns. Refills given.

## 2024-06-17 NOTE — Assessment & Plan Note (Signed)
 Has not been taking his statins. Will restart weekly pravastatin  with goal of titrating up as tolerated. Call with any concerns.

## 2024-06-17 NOTE — Assessment & Plan Note (Signed)
 Doing great with A1c of 5.8. Continue diet and exercise. Call with any concerns. Continue to monitor.

## 2024-06-17 NOTE — Assessment & Plan Note (Signed)
 Will need follow up imaging. Continue to monitor.

## 2024-06-17 NOTE — Progress Notes (Signed)
 BP 139/78 (BP Location: Left Arm, Patient Position: Sitting, Cuff Size: Normal)   Pulse 67   Temp 98.2 F (36.8 C) (Oral)   Resp 15   Ht 5' 5.98 (1.676 m)   Wt 144 lb (65.3 kg)   SpO2 98%   BMI 23.25 kg/m    Subjective:    Patient ID: Nathan Fields, male    DOB: 1951/05/10, 73 y.o.   MRN: [id number]  HPI: Nathan Fields is a 73 y.o. male  Chief Complaint  Patient presents with   Diabetes    Has been running a little high and is likely because his wife has been baking cakes.    Hypertension    No concerns for it right now.    DIABETES Hypoglycemic episodes:no Polydipsia/polyuria: no Visual disturbance: no Chest pain: no Paresthesias: no Glucose Monitoring: no  Accucheck frequency: Not Checking Taking Insulin?: no Blood Pressure Monitoring: not checking Retinal Examination: Up to Date Foot Exam: Up to Date Diabetic Education: Completed Pneumovax: Not up to Date Influenza: Not up to Date Aspirin : no  HYPERTENSION / HYPERLIPIDEMIA Satisfied with current treatment? yes Duration of hypertension: chronic BP monitoring frequency: not checking BP medication side effects: no Past BP meds: amlodipine  Duration of hyperlipidemia: chronic Cholesterol medication side effects: yes Cholesterol supplements: none Past cholesterol medications: not taking anything Medication compliance: poor compliance Aspirin : no Recent stressors: no Recurrent headaches: no Visual changes: no Palpitations: no Dyspnea: no Chest pain: no Lower extremity edema: no Dizzy/lightheaded: no   Relevant past medical, surgical, family and social history reviewed and updated as indicated. Interim medical history since our last visit reviewed. Allergies and medications reviewed and updated.  Review of Systems  Constitutional: Negative.   Respiratory: Negative.    Cardiovascular: Negative.   Musculoskeletal: Negative.   Neurological: Negative.   Psychiatric/Behavioral: Negative.       Per HPI unless specifically indicated above     Objective:    BP 139/78 (BP Location: Left Arm, Patient Position: Sitting, Cuff Size: Normal)   Pulse 67   Temp 98.2 F (36.8 C) (Oral)   Resp 15   Ht 5' 5.98 (1.676 m)   Wt 144 lb (65.3 kg)   SpO2 98%   BMI 23.25 kg/m   Wt Readings from Last 3 Encounters:  06/17/24 144 lb (65.3 kg)  03/18/24 144 lb 3.2 oz (65.4 kg)  01/06/24 155 lb (70.3 kg)    Physical Exam Vitals and nursing note reviewed.  Constitutional:      General: He is not in acute distress.    Appearance: Normal appearance. He is not ill-appearing, toxic-appearing or diaphoretic.  HENT:     Head: Normocephalic and atraumatic.     Right Ear: External ear normal.     Left Ear: External ear normal.     Nose: Nose normal.     Mouth/Throat:     Mouth: Mucous membranes are moist.     Pharynx: Oropharynx is clear.  Eyes:     General: No scleral icterus.       Right eye: No discharge.        Left eye: No discharge.     Extraocular Movements: Extraocular movements intact.     Conjunctiva/sclera: Conjunctivae normal.     Pupils: Pupils are equal, round, and reactive to light.  Cardiovascular:     Rate and Rhythm: Normal rate and regular rhythm.     Pulses: Normal pulses.     Heart sounds: Normal heart sounds. No murmur  heard.    No friction rub. No gallop.  Pulmonary:     Effort: Pulmonary effort is normal. No respiratory distress.     Breath sounds: Normal breath sounds. No stridor. No wheezing, rhonchi or rales.  Chest:     Chest wall: No tenderness.  Musculoskeletal:        General: Normal range of motion.     Cervical back: Normal range of motion and neck supple.  Skin:    General: Skin is warm and dry.     Capillary Refill: Capillary refill takes less than 2 seconds.     Coloration: Skin is not jaundiced or pale.     Findings: No bruising, erythema, lesion or rash.  Neurological:     General: No focal deficit present.     Mental Status: He is  alert and oriented to person, place, and time. Mental status is at baseline.  Psychiatric:        Mood and Affect: Mood normal.        Behavior: Behavior normal.        Thought Content: Thought content normal.        Judgment: Judgment normal.     Results for orders placed or performed in visit on 03/18/24  Bayer DCA Hb A1c Waived   Collection Time: 03/18/24  3:36 PM  Result Value Ref Range   HB A1C (BAYER DCA - WAIVED) 5.9 (H) 4.8 - 5.6 %  Microalbumin, Urine Waived   Collection Time: 03/18/24  3:36 PM  Result Value Ref Range   Microalb, Ur Waived 30 (H) 0 - 19 mg/L   Creatinine, Urine Waived 200 10 - 300 mg/dL   Microalb/Creat Ratio <30 <30 mg/g  CBC with Differential/Platelet   Collection Time: 03/18/24  3:37 PM  Result Value Ref Range   WBC 7.2 3.4 - 10.8 x10E3/uL   RBC 4.81 4.14 - 5.80 x10E6/uL   Hemoglobin 14.6 13.0 - 17.7 g/dL   Hematocrit 55.1 62.4 - 51.0 %   MCV 93 79 - 97 fL   MCH 30.4 26.6 - 33.0 pg   MCHC 32.6 31.5 - 35.7 g/dL   RDW 87.1 88.3 - 84.5 %   Platelets 239 150 - 450 x10E3/uL   Neutrophils 71 Not Estab. %   Lymphs 20 Not Estab. %   Monocytes 7 Not Estab. %   Eos 1 Not Estab. %   Basos 1 Not Estab. %   Neutrophils Absolute 5.1 1.4 - 7.0 x10E3/uL   Lymphocytes Absolute 1.4 0.7 - 3.1 x10E3/uL   Monocytes Absolute 0.5 0.1 - 0.9 x10E3/uL   EOS (ABSOLUTE) 0.1 0.0 - 0.4 x10E3/uL   Basophils Absolute 0.1 0.0 - 0.2 x10E3/uL   Immature Granulocytes 0 Not Estab. %   Immature Grans (Abs) 0.0 0.0 - 0.1 x10E3/uL  Comprehensive metabolic panel with GFR   Collection Time: 03/18/24  3:37 PM  Result Value Ref Range   Glucose 87 70 - 99 mg/dL   BUN 17 8 - 27 mg/dL   Creatinine, Ser 8.88 0.76 - 1.27 mg/dL   eGFR 71 >40 fO/fpw/8.26   BUN/Creatinine Ratio 15 10 - 24   Sodium 143 134 - 144 mmol/L   Potassium 4.0 3.5 - 5.2 mmol/L   Chloride 102 96 - 106 mmol/L   CO2 24 20 - 29 mmol/L   Calcium  9.4 8.6 - 10.2 mg/dL   Total Protein 6.7 6.0 - 8.5 g/dL   Albumin  4.4 3.8 - 4.8 g/dL   Globulin, Total  2.3 1.5 - 4.5 g/dL   Bilirubin Total 0.4 0.0 - 1.2 mg/dL   Alkaline Phosphatase 99 47 - 123 IU/L   AST 17 0 - 40 IU/L   ALT 13 0 - 44 IU/L  Lipid Panel w/o Chol/HDL Ratio   Collection Time: 03/18/24  3:37 PM  Result Value Ref Range   Cholesterol, Total 242 (H) 100 - 199 mg/dL   Triglycerides 84 0 - 149 mg/dL   HDL 65 >60 mg/dL   VLDL Cholesterol Cal 14 5 - 40 mg/dL   LDL Chol Calc (NIH) 836 (H) 0 - 99 mg/dL  A87   Collection Time: 03/18/24  3:37 PM  Result Value Ref Range   Vitamin B-12 542 232 - 1,245 pg/mL      Assessment & Plan:   Problem List Items Addressed This Visit       Cardiovascular and Mediastinum   Hypertension   Under good control on current regimen. Continue current regimen. Continue to monitor. Call with any concerns. Refills given.        Relevant Medications   pravastatin  (PRAVACHOL ) 20 MG tablet   amLODipine  (NORVASC ) 10 MG tablet   Aortic root dilatation   Will need follow up imaging. Continue to monitor.       Relevant Medications   pravastatin  (PRAVACHOL ) 20 MG tablet   amLODipine  (NORVASC ) 10 MG tablet     Endocrine   Controlled type 2 diabetes mellitus with microalbuminuria (HCC) - Primary   Doing great with A1c of 5.8. Continue diet and exercise. Call with any concerns. Continue to monitor.       Relevant Medications   pravastatin  (PRAVACHOL ) 20 MG tablet   Other Relevant Orders   Bayer DCA Hb A1c Waived     Other   Hyperlipidemia   Has not been taking his statins. Will restart weekly pravastatin  with goal of titrating up as tolerated. Call with any concerns.       Relevant Medications   pravastatin  (PRAVACHOL ) 20 MG tablet   amLODipine  (NORVASC ) 10 MG tablet     Follow up plan: Return in about 3 months (around 09/15/2024).

## 2024-06-30 ENCOUNTER — Other Ambulatory Visit: Payer: Self-pay | Admitting: Family Medicine

## 2024-07-01 NOTE — Telephone Encounter (Signed)
 Requested Prescriptions  Refused Prescriptions Disp Refills   amLODipine  (NORVASC ) 5 MG tablet [Pharmacy Med Name: AMLODIPINE  BESYLATE 5 MG TAB] 45 tablet 1    Sig: TAKE 1/2 TABLET BY MOUTH DAILY     Cardiovascular: Calcium  Channel Blockers 2 Passed - 07/01/2024 12:51 PM      Passed - Last BP in normal range    BP Readings from Last 1 Encounters:  06/17/24 139/78         Passed - Last Heart Rate in normal range    Pulse Readings from Last 1 Encounters:  06/17/24 67         Passed - Valid encounter within last 6 months    Recent Outpatient Visits           2 weeks ago Controlled type 2 diabetes mellitus with microalbuminuria (HCC)   [place] [facility] [place], Megan P, DO   3 months ago Mixed hyperlipidemia   [place] [facility] [place], Megan P, DO   5 months ago Patient left without being seen   [facility], Megan P, DO   9 months ago Primary hypertension   [place] [facility] [place], Megan P, DO   11 months ago Mixed hyperlipidemia    [facility], [place], DO

## 2024-09-16 ENCOUNTER — Ambulatory Visit: Admitting: Family Medicine

## 2024-10-20 ENCOUNTER — Ambulatory Visit (INDEPENDENT_AMBULATORY_CARE_PROVIDER_SITE_OTHER): Admitting: Nurse Practitioner

## 2024-10-20 ENCOUNTER — Encounter (INDEPENDENT_AMBULATORY_CARE_PROVIDER_SITE_OTHER)

## 2025-01-11 ENCOUNTER — Ambulatory Visit
